# Patient Record
Sex: Male | Born: 1950 | Race: White | Hispanic: No | Marital: Single | Smoking: Former smoker
Health system: Southern US, Community
[De-identification: ages and names within clinical notes are randomized; demographics above are authoritative.]

## PROBLEM LIST (undated history)

## (undated) DIAGNOSIS — Z8719 Personal history of other diseases of the digestive system: Secondary | ICD-10-CM

## (undated) DIAGNOSIS — M1990 Unspecified osteoarthritis, unspecified site: Secondary | ICD-10-CM

## (undated) DIAGNOSIS — I700 Atherosclerosis of aorta: Secondary | ICD-10-CM

## (undated) DIAGNOSIS — T7840XA Allergy, unspecified, initial encounter: Secondary | ICD-10-CM

## (undated) DIAGNOSIS — Z860100 Personal history of colon polyps, unspecified: Secondary | ICD-10-CM

## (undated) DIAGNOSIS — F32A Depression, unspecified: Secondary | ICD-10-CM

## (undated) DIAGNOSIS — S8412XA Injury of peroneal nerve at lower leg level, left leg, initial encounter: Secondary | ICD-10-CM

## (undated) DIAGNOSIS — Z8679 Personal history of other diseases of the circulatory system: Secondary | ICD-10-CM

## (undated) DIAGNOSIS — N400 Enlarged prostate without lower urinary tract symptoms: Secondary | ICD-10-CM

## (undated) DIAGNOSIS — I714 Abdominal aortic aneurysm, without rupture, unspecified: Secondary | ICD-10-CM

## (undated) DIAGNOSIS — I10 Essential (primary) hypertension: Secondary | ICD-10-CM

## (undated) DIAGNOSIS — Z87442 Personal history of urinary calculi: Secondary | ICD-10-CM

## (undated) DIAGNOSIS — K5730 Diverticulosis of large intestine without perforation or abscess without bleeding: Secondary | ICD-10-CM

## (undated) DIAGNOSIS — J309 Allergic rhinitis, unspecified: Secondary | ICD-10-CM

## (undated) DIAGNOSIS — G4730 Sleep apnea, unspecified: Secondary | ICD-10-CM

## (undated) DIAGNOSIS — E079 Disorder of thyroid, unspecified: Secondary | ICD-10-CM

## (undated) DIAGNOSIS — Z9189 Other specified personal risk factors, not elsewhere classified: Secondary | ICD-10-CM

## (undated) DIAGNOSIS — E039 Hypothyroidism, unspecified: Secondary | ICD-10-CM

## (undated) DIAGNOSIS — R21 Rash and other nonspecific skin eruption: Secondary | ICD-10-CM

## (undated) DIAGNOSIS — R7303 Prediabetes: Secondary | ICD-10-CM

## (undated) DIAGNOSIS — F329 Major depressive disorder, single episode, unspecified: Secondary | ICD-10-CM

## (undated) DIAGNOSIS — K219 Gastro-esophageal reflux disease without esophagitis: Secondary | ICD-10-CM

## (undated) DIAGNOSIS — G629 Polyneuropathy, unspecified: Secondary | ICD-10-CM

## (undated) DIAGNOSIS — E785 Hyperlipidemia, unspecified: Secondary | ICD-10-CM

## (undated) DIAGNOSIS — N3289 Other specified disorders of bladder: Secondary | ICD-10-CM

## (undated) DIAGNOSIS — Z8711 Personal history of peptic ulcer disease: Secondary | ICD-10-CM

## (undated) DIAGNOSIS — I252 Old myocardial infarction: Secondary | ICD-10-CM

## (undated) DIAGNOSIS — R079 Chest pain, unspecified: Secondary | ICD-10-CM

## (undated) DIAGNOSIS — Z87898 Personal history of other specified conditions: Secondary | ICD-10-CM

## (undated) DIAGNOSIS — I2510 Atherosclerotic heart disease of native coronary artery without angina pectoris: Secondary | ICD-10-CM

## (undated) DIAGNOSIS — Z955 Presence of coronary angioplasty implant and graft: Secondary | ICD-10-CM

## (undated) DIAGNOSIS — F419 Anxiety disorder, unspecified: Secondary | ICD-10-CM

## (undated) HISTORY — PX: HERNIA REPAIR: SHX51

## (undated) HISTORY — DX: Allergic rhinitis, unspecified: J30.9

## (undated) HISTORY — DX: Sleep apnea, unspecified: G47.30

## (undated) HISTORY — DX: Chest pain, unspecified: R07.9

## (undated) HISTORY — PX: TONSILLECTOMY AND ADENOIDECTOMY: SUR1326

## (undated) HISTORY — DX: Essential (primary) hypertension: I10

## (undated) HISTORY — DX: Abdominal aortic aneurysm, without rupture, unspecified: I71.40

## (undated) HISTORY — DX: Disorder of thyroid, unspecified: E07.9

## (undated) HISTORY — DX: Hyperlipidemia, unspecified: E78.5

## (undated) HISTORY — DX: Atherosclerotic heart disease of native coronary artery without angina pectoris: I25.10

## (undated) HISTORY — PX: TRANSURETHRAL RESECTION OF PROSTATE: SHX73

## (undated) HISTORY — DX: Atherosclerosis of aorta: I70.0

## (undated) HISTORY — DX: Allergy, unspecified, initial encounter: T78.40XA

## (undated) HISTORY — DX: Polyneuropathy, unspecified: G62.9

## (undated) HISTORY — DX: Gastro-esophageal reflux disease without esophagitis: K21.9

## (undated) HISTORY — DX: Hypothyroidism, unspecified: E03.9

## (undated) HISTORY — DX: Anxiety disorder, unspecified: F41.9

## (undated) HISTORY — DX: Unspecified osteoarthritis, unspecified site: M19.90

---

## 1994-02-16 DIAGNOSIS — Z87898 Personal history of other specified conditions: Secondary | ICD-10-CM | POA: Insufficient documentation

## 1994-02-16 DIAGNOSIS — I252 Old myocardial infarction: Secondary | ICD-10-CM

## 1994-02-16 HISTORY — PX: CORONARY ANGIOPLASTY WITH STENT PLACEMENT: SHX49

## 1994-02-16 HISTORY — DX: Old myocardial infarction: I25.2

## 1998-02-20 ENCOUNTER — Emergency Department (HOSPITAL_COMMUNITY): Admission: EM | Admit: 1998-02-20 | Discharge: 1998-02-20 | Payer: Self-pay | Admitting: Emergency Medicine

## 1999-05-23 ENCOUNTER — Emergency Department (HOSPITAL_COMMUNITY): Admission: EM | Admit: 1999-05-23 | Discharge: 1999-05-23 | Payer: Self-pay | Admitting: *Deleted

## 1999-05-24 ENCOUNTER — Emergency Department (HOSPITAL_COMMUNITY): Admission: EM | Admit: 1999-05-24 | Discharge: 1999-05-24 | Payer: Self-pay | Admitting: Emergency Medicine

## 2002-06-24 ENCOUNTER — Emergency Department (HOSPITAL_COMMUNITY): Admission: EM | Admit: 2002-06-24 | Discharge: 2002-06-24 | Payer: Self-pay | Admitting: Emergency Medicine

## 2002-06-26 ENCOUNTER — Emergency Department (HOSPITAL_COMMUNITY): Admission: EM | Admit: 2002-06-26 | Discharge: 2002-06-26 | Payer: Self-pay | Admitting: Emergency Medicine

## 2002-08-07 ENCOUNTER — Encounter: Payer: Self-pay | Admitting: Gastroenterology

## 2002-08-07 ENCOUNTER — Ambulatory Visit (HOSPITAL_COMMUNITY): Admission: RE | Admit: 2002-08-07 | Discharge: 2002-08-07 | Payer: Self-pay | Admitting: Gastroenterology

## 2002-09-06 ENCOUNTER — Emergency Department (HOSPITAL_COMMUNITY): Admission: EM | Admit: 2002-09-06 | Discharge: 2002-09-06 | Payer: Self-pay | Admitting: Emergency Medicine

## 2009-05-02 ENCOUNTER — Ambulatory Visit (HOSPITAL_COMMUNITY): Admission: RE | Admit: 2009-05-02 | Discharge: 2009-05-02 | Payer: Self-pay | Admitting: Urology

## 2009-05-02 HISTORY — PX: EXTRACORPOREAL SHOCK WAVE LITHOTRIPSY: SHX1557

## 2011-01-23 ENCOUNTER — Other Ambulatory Visit (HOSPITAL_BASED_OUTPATIENT_CLINIC_OR_DEPARTMENT_OTHER): Payer: Self-pay | Admitting: General Practice

## 2011-01-23 ENCOUNTER — Ambulatory Visit (HOSPITAL_BASED_OUTPATIENT_CLINIC_OR_DEPARTMENT_OTHER)
Admission: RE | Admit: 2011-01-23 | Discharge: 2011-01-23 | Disposition: A | Discharge status: Home / Self Care | Payer: PRIVATE HEALTH INSURANCE | Source: Ambulatory Visit | Attending: Family Medicine | Admitting: Family Medicine

## 2011-01-23 DIAGNOSIS — Z01818 Encounter for other preprocedural examination: Secondary | ICD-10-CM | POA: Insufficient documentation

## 2011-01-23 DIAGNOSIS — Z0389 Encounter for observation for other suspected diseases and conditions ruled out: Secondary | ICD-10-CM

## 2011-01-23 DIAGNOSIS — M545 Low back pain, unspecified: Secondary | ICD-10-CM | POA: Insufficient documentation

## 2011-01-24 ENCOUNTER — Ambulatory Visit (HOSPITAL_BASED_OUTPATIENT_CLINIC_OR_DEPARTMENT_OTHER)
Admission: RE | Admit: 2011-01-24 | Discharge: 2011-01-24 | Disposition: A | Discharge status: Home / Self Care | Payer: PRIVATE HEALTH INSURANCE | Source: Ambulatory Visit | Attending: Family Medicine | Admitting: Family Medicine

## 2011-01-24 DIAGNOSIS — M519 Unspecified thoracic, thoracolumbar and lumbosacral intervertebral disc disorder: Secondary | ICD-10-CM | POA: Insufficient documentation

## 2011-01-24 DIAGNOSIS — M5137 Other intervertebral disc degeneration, lumbosacral region: Secondary | ICD-10-CM

## 2011-01-24 DIAGNOSIS — M545 Low back pain, unspecified: Secondary | ICD-10-CM | POA: Insufficient documentation

## 2011-01-24 DIAGNOSIS — M79609 Pain in unspecified limb: Secondary | ICD-10-CM

## 2011-01-24 DIAGNOSIS — M51379 Other intervertebral disc degeneration, lumbosacral region without mention of lumbar back pain or lower extremity pain: Secondary | ICD-10-CM | POA: Insufficient documentation

## 2011-06-25 ENCOUNTER — Other Ambulatory Visit (HOSPITAL_COMMUNITY): Payer: Self-pay | Admitting: Family Medicine

## 2011-06-25 DIAGNOSIS — R1310 Dysphagia, unspecified: Secondary | ICD-10-CM

## 2011-06-30 ENCOUNTER — Other Ambulatory Visit (HOSPITAL_COMMUNITY): Payer: PRIVATE HEALTH INSURANCE

## 2011-07-09 ENCOUNTER — Encounter: Payer: Self-pay | Admitting: Internal Medicine

## 2011-07-29 ENCOUNTER — Other Ambulatory Visit: Payer: Self-pay | Admitting: Urology

## 2011-08-12 ENCOUNTER — Ambulatory Visit (INDEPENDENT_AMBULATORY_CARE_PROVIDER_SITE_OTHER): Payer: PRIVATE HEALTH INSURANCE | Admitting: Internal Medicine

## 2011-08-12 ENCOUNTER — Encounter: Payer: Self-pay | Admitting: Internal Medicine

## 2011-08-12 VITALS — BP 132/70 | HR 62 | Ht 71.0 in | Wt 195.0 lb

## 2011-08-12 DIAGNOSIS — D1739 Benign lipomatous neoplasm of skin and subcutaneous tissue of other sites: Secondary | ICD-10-CM

## 2011-08-12 DIAGNOSIS — R142 Eructation: Secondary | ICD-10-CM

## 2011-08-12 DIAGNOSIS — Z1211 Encounter for screening for malignant neoplasm of colon: Secondary | ICD-10-CM

## 2011-08-12 DIAGNOSIS — D171 Benign lipomatous neoplasm of skin and subcutaneous tissue of trunk: Secondary | ICD-10-CM

## 2011-08-12 DIAGNOSIS — R1310 Dysphagia, unspecified: Secondary | ICD-10-CM

## 2011-08-12 DIAGNOSIS — K219 Gastro-esophageal reflux disease without esophagitis: Secondary | ICD-10-CM

## 2011-08-12 MED ORDER — MOVIPREP 100 G PO SOLR: ORAL | Status: DC

## 2011-08-12 NOTE — Patient Instructions (Addendum)
You have been scheduled for an endoscopy and colonoscopy with propofol. Please follow the written instructions given to you at your visit today. Please pick up your prep at the pharmacy within the next 1-3 days.  

## 2011-08-13 ENCOUNTER — Encounter: Payer: Self-pay | Admitting: Internal Medicine

## 2011-08-20 NOTE — Progress Notes (Signed)
HISTORY OF PRESENT ILLNESS:  Larry Randall is a 61 y.o. male with multiple medical problems as listed below. He presents today regarding the bitter taste in his mouth and generalized abdominal discomfort. Patient reports an 8 month history of a burning sensation in his nose and mouth. He states that it feels like his case was or irritated. He saw a dentist who suggested GERD as a cause. He was subsequently placed on Nexium 40 mg daily. He has been on that for about 2 months without improvement. He did have a history of occasional heartburn, though this has resolved on medication. He does report intermittent solid food dysphagia. Occasional liquids. Next, a 7 month history of intermittent generalized abdominal discomfort. This is associated with bloating. He reports that his bowel habits are regular as long as he takes "colon health". No rectal bleeding except for red blood on the tissue on one occasion with straining. He thinks he has hemorrhoids. He reports some weight gain. He is also concerned about the palpable area on his abdomen. He is interested in colon cancer screening. None previously. His sister, Larry Randall, is a patient. He reports that his cardiac conditions are under good control. No recent hospitalizations regarding these issues.  REVIEW OF SYSTEMS:  All non-GI ROS negative except for sinus and allergy trouble, anxiety, arthritis, back pain, visual change, confusion, cough, depression, fatigue, hearing problems, muscle pains, cramps, sleeping problems, sore throat, swollen lymph glands, excessive thirst, excessive urination, urinary frequency and pain, urinary leakage  Past Medical History  Diagnosis Date  . Anxiety   . Arthritis   . Asthma   . CHF (congestive heart failure)   . CAD (coronary artery disease)   . Depressed   . GERD (gastroesophageal reflux disease)   . Hyperlipemia   . Hypertension   . Thyroid disease   . Kidney stone   . UTI (lower urinary tract infection)      Past Surgical History  Procedure Date  . Tonsillectomy   . Wrist surgery     rt  . Prostate biopsy     Social History Larry Randall  reports that he has quit smoking. He does not have any smokeless tobacco history on file. He reports that he does not drink alcohol or use illicit drugs.  family history includes Heart disease in his father.  There is no history of Colon cancer.  Not on File     PHYSICAL EXAMINATION: Vital signs: BP 132/70  Pulse 62  Ht 5\' 11"  (1.803 m)  Wt 195 lb (88.451 kg)  BMI 27.20 kg/m2  Constitutional: generally well-appearing, no acute distress Psychiatric: alert and oriented x3, cooperative Eyes: extraocular movements intact, anicteric, conjunctiva pink Mouth: oral pharynx moist, no lesions Neck: supple no lymphadenopathy Cardiovascular: heart regular rate and rhythm, no murmur Lungs: clear to auscultation bilaterally Abdomen: soft, nontender, nondistended, no obvious ascites, no peritoneal signs, normal bowel sounds, no organomegaly. Palpable mobile lipoma left midabdomen Rectal:deferred until colonoscopy Extremities: no lower extremity edema bilaterally Skin: no lesions on visible extremities Neuro: No focal deficits. No asterixis.     ASSESSMENT:  #1. Burning sensation in the nose and mouth. Etiology unclear. No response to PPI #2. History of intermittent GERD symptoms. Relieved with Nexium. #3. Intermittent solid food dysphagia. Rule out stricture #4. Colon cancer screening. Baseline risk. Appropriate candidate without contraindication. #5. Multiple medical problems. Stable. #6. Abdominal wall lipoma. Reassured  #7. Abdominal discomfort said he sedated with increased gas    PLAN:  #1.  Continue Nexium #2. Reflux precautions  #3. Schedule upper endoscopy with possible esophageal dilation. This to evaluate abdominal discomfort as well as dysphagia.The nature of the procedure, as well as the risks, benefits, and alternatives were  carefully and thoroughly reviewed with the patient. Ample time for discussion and questions allowed. The patient understood, was satisfied, and agreed to proceed.  #4. Consider ENT evaluation for nose and mouth symptoms. His primary provider can arrange this at his discretion #5. Screening colonoscopy.The nature of the procedure, as well as the risks, benefits, and alternatives were carefully and thoroughly reviewed with the patient. Ample time for discussion and questions allowed. The patient understood, was satisfied, and agreed to proceed. Movi prep prescribed. The patient instructed on its use

## 2011-09-02 ENCOUNTER — Encounter (HOSPITAL_BASED_OUTPATIENT_CLINIC_OR_DEPARTMENT_OTHER): Payer: Self-pay | Admitting: *Deleted

## 2011-09-02 NOTE — Progress Notes (Addendum)
NPO AFTER MN. ARRIVES AT 0730. NEEDS ISTAT (HX CAD) . CURRENT EKG AND LAST NOTE TO BE FAXED FROM DR Evelene Croon [facility]). WILL TAKE NEXIUM AM OF SURG W/ SIP OF WATER. UNDERSTANT WILL BEING OVERNIGHT AT MAIN.  REVIEWED CHART W/ DR FORTUNE, INCLUDING LAST EKG AND NOTE FROM PCP. STATES REPEAT EKG DOS.

## 2011-09-07 ENCOUNTER — Encounter (HOSPITAL_BASED_OUTPATIENT_CLINIC_OR_DEPARTMENT_OTHER): Payer: Self-pay | Admitting: Anesthesiology

## 2011-09-07 ENCOUNTER — Other Ambulatory Visit: Payer: Self-pay

## 2011-09-07 ENCOUNTER — Encounter (HOSPITAL_BASED_OUTPATIENT_CLINIC_OR_DEPARTMENT_OTHER)
Admission: RE | Disposition: A | Discharge status: Home / Self Care | Payer: Self-pay | Source: Ambulatory Visit | Attending: Urology

## 2011-09-07 ENCOUNTER — Ambulatory Visit (HOSPITAL_BASED_OUTPATIENT_CLINIC_OR_DEPARTMENT_OTHER): Payer: PRIVATE HEALTH INSURANCE | Admitting: Anesthesiology

## 2011-09-07 ENCOUNTER — Encounter (HOSPITAL_BASED_OUTPATIENT_CLINIC_OR_DEPARTMENT_OTHER): Payer: Self-pay | Admitting: *Deleted

## 2011-09-07 ENCOUNTER — Ambulatory Visit (HOSPITAL_BASED_OUTPATIENT_CLINIC_OR_DEPARTMENT_OTHER)
Admission: RE | Admit: 2011-09-07 | Discharge: 2011-09-08 | Disposition: A | Discharge status: Home / Self Care | Payer: PRIVATE HEALTH INSURANCE | Source: Ambulatory Visit | Attending: Urology | Admitting: Urology

## 2011-09-07 DIAGNOSIS — N401 Enlarged prostate with lower urinary tract symptoms: Secondary | ICD-10-CM | POA: Insufficient documentation

## 2011-09-07 DIAGNOSIS — E785 Hyperlipidemia, unspecified: Secondary | ICD-10-CM | POA: Insufficient documentation

## 2011-09-07 DIAGNOSIS — I2510 Atherosclerotic heart disease of native coronary artery without angina pectoris: Secondary | ICD-10-CM | POA: Insufficient documentation

## 2011-09-07 DIAGNOSIS — R339 Retention of urine, unspecified: Secondary | ICD-10-CM | POA: Insufficient documentation

## 2011-09-07 DIAGNOSIS — N138 Other obstructive and reflux uropathy: Secondary | ICD-10-CM | POA: Insufficient documentation

## 2011-09-07 DIAGNOSIS — K219 Gastro-esophageal reflux disease without esophagitis: Secondary | ICD-10-CM | POA: Insufficient documentation

## 2011-09-07 DIAGNOSIS — N4289 Other specified disorders of prostate: Secondary | ICD-10-CM | POA: Insufficient documentation

## 2011-09-07 DIAGNOSIS — I252 Old myocardial infarction: Secondary | ICD-10-CM | POA: Insufficient documentation

## 2011-09-07 HISTORY — DX: Rash and other nonspecific skin eruption: R21

## 2011-09-07 HISTORY — DX: Personal history of urinary calculi: Z87.442

## 2011-09-07 HISTORY — DX: Personal history of peptic ulcer disease: Z87.11

## 2011-09-07 HISTORY — DX: Old myocardial infarction: I25.2

## 2011-09-07 HISTORY — PX: TRANSURETHRAL RESECTION OF PROSTATE: SHX73

## 2011-09-07 HISTORY — DX: Personal history of other diseases of the circulatory system: Z86.79

## 2011-09-07 HISTORY — DX: Presence of coronary angioplasty implant and graft: Z95.5

## 2011-09-07 HISTORY — DX: Major depressive disorder, single episode, unspecified: F32.9

## 2011-09-07 HISTORY — DX: Depression, unspecified: F32.A

## 2011-09-07 HISTORY — DX: Personal history of other diseases of the digestive system: Z87.19

## 2011-09-07 LAB — POCT I-STAT 4, (NA,K, GLUC, HGB,HCT): HCT: 38 % — ABNORMAL LOW (ref 39.0–52.0)

## 2011-09-07 SURGERY — TRANSURETHRAL RESECTION OF THE PROSTATE WITH GYRUS INSTRUMENTS
Anesthesia: General | Site: Prostate | Wound class: Clean Contaminated

## 2011-09-07 MED ORDER — MIDAZOLAM HCL 5 MG/5ML IJ SOLN
INTRAMUSCULAR | Status: DC | PRN
  Administered 2011-09-07: 1 mg via INTRAVENOUS

## 2011-09-07 MED ORDER — ETOMIDATE 2 MG/ML IV SOLN
INTRAVENOUS | Status: DC | PRN
  Administered 2011-09-07: 30 mg via INTRAVENOUS

## 2011-09-07 MED ORDER — CIPROFLOXACIN HCL 250 MG PO TABS
250.0000 mg | ORAL_TABLET | Freq: Two times a day (BID) | ORAL | Status: DC
  Administered 2011-09-07 (×2): 250 mg via ORAL

## 2011-09-07 MED ORDER — PANTOPRAZOLE SODIUM 40 MG PO TBEC: 40.0000 mg | DELAYED_RELEASE_TABLET | Freq: Every day | ORAL | Status: DC

## 2011-09-07 MED ORDER — BELLADONNA ALKALOIDS-OPIUM 16.2-60 MG RE SUPP
RECTAL | Status: DC | PRN
  Administered 2011-09-07: 1 via RECTAL

## 2011-09-07 MED ORDER — EPHEDRINE SULFATE 50 MG/ML IJ SOLN
INTRAMUSCULAR | Status: DC | PRN
  Administered 2011-09-07: 10 mg via INTRAVENOUS
  Administered 2011-09-07 (×2): 5 mg via INTRAVENOUS
  Administered 2011-09-07: 10 mg via INTRAVENOUS

## 2011-09-07 MED ORDER — ZOLPIDEM TARTRATE 5 MG PO TABS: 5.0000 mg | ORAL_TABLET | Freq: Every evening | ORAL | Status: DC | PRN

## 2011-09-07 MED ORDER — FENTANYL CITRATE 0.05 MG/ML IJ SOLN
INTRAMUSCULAR | Status: DC | PRN
  Administered 2011-09-07 (×6): 25 ug via INTRAVENOUS
  Administered 2011-09-07: 50 ug via INTRAVENOUS

## 2011-09-07 MED ORDER — ONDANSETRON HCL 4 MG/2ML IJ SOLN: 4.0000 mg | INTRAMUSCULAR | Status: DC | PRN

## 2011-09-07 MED ORDER — HYDROCODONE-ACETAMINOPHEN 5-325 MG PO TABS: 1.0000 | ORAL_TABLET | ORAL | Status: DC | PRN

## 2011-09-07 MED ORDER — LACTATED RINGERS IV SOLN
INTRAVENOUS | Status: DC
  Administered 2011-09-07: 100 mL/h via INTRAVENOUS

## 2011-09-07 MED ORDER — DOCUSATE SODIUM 100 MG PO CAPS
100.0000 mg | ORAL_CAPSULE | Freq: Two times a day (BID) | ORAL | Status: DC
  Administered 2011-09-07 (×2): 100 mg via ORAL

## 2011-09-07 MED ORDER — CEFAZOLIN SODIUM-DEXTROSE 2-3 GM-% IV SOLR
2.0000 g | INTRAVENOUS | Status: AC
  Administered 2011-09-07: 2 g via INTRAVENOUS

## 2011-09-07 MED ORDER — LIDOCAINE HCL (CARDIAC) 20 MG/ML IV SOLN
INTRAVENOUS | Status: DC | PRN
  Administered 2011-09-07: 50 mg via INTRAVENOUS

## 2011-09-07 MED ORDER — DEXAMETHASONE SODIUM PHOSPHATE 4 MG/ML IJ SOLN
INTRAMUSCULAR | Status: DC | PRN
  Administered 2011-09-07: 10 mg via INTRAVENOUS

## 2011-09-07 MED ORDER — BELLADONNA ALKALOIDS-OPIUM 16.2-60 MG RE SUPP: 1.0000 | Freq: Four times a day (QID) | RECTAL | Status: DC | PRN

## 2011-09-07 MED ORDER — ONDANSETRON HCL 4 MG/2ML IJ SOLN
INTRAMUSCULAR | Status: DC | PRN
  Administered 2011-09-07: 4 mg via INTRAVENOUS

## 2011-09-07 MED ORDER — DEXTROSE-NACL 5-0.45 % IV SOLN
INTRAVENOUS | Status: DC
  Administered 2011-09-07: 15:00:00 via INTRAVENOUS

## 2011-09-07 MED ORDER — FENTANYL CITRATE 0.05 MG/ML IJ SOLN: 25.0000 ug | INTRAMUSCULAR | Status: DC | PRN

## 2011-09-07 MED ORDER — CEFAZOLIN SODIUM 1-5 GM-% IV SOLN: 1.0000 g | INTRAVENOUS | Status: DC

## 2011-09-07 MED ORDER — SODIUM CHLORIDE 0.9 % IR SOLN
Status: DC | PRN
  Administered 2011-09-07: 48000 mL

## 2011-09-07 MED ORDER — CITALOPRAM HYDROBROMIDE 40 MG PO TABS: 40.0000 mg | ORAL_TABLET | Freq: Every day | ORAL | Status: DC

## 2011-09-07 SURGICAL SUPPLY — 33 items
BAG DRAIN URO-CYSTO SKYTR STRL (DRAIN) ×2 IMPLANT
BAG URINE DRAINAGE (UROLOGICAL SUPPLIES) IMPLANT
BAG URINE LEG 19OZ MD ST LTX (BAG) IMPLANT
CANISTER SUCT LVC 12 LTR MEDI- (MISCELLANEOUS) ×10 IMPLANT
CATH FOLEY 2WAY SLVR  5CC 20FR (CATHETERS)
CATH FOLEY 2WAY SLVR  5CC 22FR (CATHETERS)
CATH FOLEY 2WAY SLVR 30CC 22FR (CATHETERS) IMPLANT
CATH FOLEY 2WAY SLVR 5CC 20FR (CATHETERS) IMPLANT
CATH FOLEY 2WAY SLVR 5CC 22FR (CATHETERS) IMPLANT
CATH FOLEY 3WAY 30CC 22F (CATHETERS) ×2 IMPLANT
CATH HEMA 3WAY 30CC 24FR COUDE (CATHETERS) IMPLANT
CATH HEMA 3WAY 30CC 24FR RND (CATHETERS) IMPLANT
CLOTH BEACON ORANGE TIMEOUT ST (SAFETY) ×2 IMPLANT
DRAPE CAMERA CLOSED 9X96 (DRAPES) ×2 IMPLANT
ELECT BUTTON HF 24-28F 2 30DE (ELECTRODE) IMPLANT
ELECT LOOP MED HF 24F 12D CBL (CLIP) ×2 IMPLANT
ELECT REM PT RETURN 9FT ADLT (ELECTROSURGICAL)
ELECT RESECT VAPORIZE 12D CBL (ELECTRODE) ×2 IMPLANT
ELECTRODE REM PT RTRN 9FT ADLT (ELECTROSURGICAL) IMPLANT
GLOVE BIO SURGEON STRL SZ8 (GLOVE) ×2 IMPLANT
GLOVE ECLIPSE 6.5 STRL STRAW (GLOVE) ×2 IMPLANT
GLOVE INDICATOR 6.5 STRL GRN (GLOVE) ×2 IMPLANT
GOWN PREVENTION PLUS LG XLONG (DISPOSABLE) ×2 IMPLANT
GOWN STRL NON-REIN LRG LVL3 (GOWN DISPOSABLE) ×2 IMPLANT
GOWN STRL REIN XL XLG (GOWN DISPOSABLE) ×2 IMPLANT
HOLDER FOLEY CATH W/STRAP (MISCELLANEOUS) IMPLANT
IV NS IRRIG 3000ML ARTHROMATIC (IV SOLUTION) ×32 IMPLANT
KIT ASPIRATION TUBING (SET/KITS/TRAYS/PACK) ×2 IMPLANT
PACK CYSTOSCOPY (CUSTOM PROCEDURE TRAY) ×2 IMPLANT
PLUG CATH AND CAP STER (CATHETERS) IMPLANT
SET ASPIRATION TUBING (TUBING) IMPLANT
SYR 30ML LL (SYRINGE) ×2 IMPLANT
SYRINGE IRR TOOMEY STRL 70CC (SYRINGE) ×2 IMPLANT

## 2011-09-07 NOTE — H&P (Signed)
Urology History and Physical Exam  CC: Large prostate  HPI: 61 year old male presents today for TURP. He has a following history:   He is on both alpha blockers and Avodart for a very large prostate. In 2011, ultrasound and biopsy of the prostate was performed due to elevated PSA of between 8 and 9. Benign findings were noted. Glandular size was approximately 190 g at that time. Since his last visit with me in 2012, he has been in retention. He was last seen in our office in May, 2013 for voiding trial. At this point, despite him using Avodart and Uroxatral, he would like to come off these medicines and have a procedure done for his prostate.     PMH: Past Medical History  Diagnosis Date  . Anxiety   . Arthritis   . GERD (gastroesophageal reflux disease)   . Hyperlipemia   . CAD (coronary artery disease) FOLLOWED PCP  DR Barnetta Chapel  [facility])    PT DENIES CARDIAC SYMPTOMS  . History of MI (myocardial infarction) 1996--  S/P ANGIOPLASTY X3 STENTS  . S/P primary angioplasty with coronary stent X3 STENTS  1996  POST MI    PER PT BARE METAL STENTS  . H/O hiatal hernia   . History of CHF (congestive heart failure)   . History of gastric ulcer   . Atypical rash OF MOUTH -- PER PT UNKNOWN ETIOLOGY    RX MOUTHWASH BID-  PT STATES POSSIBLE FROM GERD  . History of kidney stones   . Depression     PSH: Past Surgical History  Procedure Date  . Tonsillectomy and adenoidectomy CHILD  . Extracorporeal shock wave lithotripsy 05-02-2009  . Transurethral resection of prostate X2   YRS AGO    BEFORE 1996  . Coronary angioplasty with stent placement 1996    PER PT X3 STENTS (BARE METAL)    Allergies: No Known Allergies  Medications: No prescriptions prior to admission     Social History: History   Social History  . Marital Status: Divorced    Spouse Name: N/A    Number of Children: 3  . Years of Education: N/A   Occupational History  . Not on file.    Social History Main Topics  . Smoking status: Former Smoker -- 1.0 packs/day for 30 years    Types: Cigarettes    Quit date: 09/02/1994  . Smokeless tobacco: Never Used  . Alcohol Use: No  . Drug Use: No  . Sexually Active: Not on file   Other Topics Concern  . Not on file   Social History Narrative  . No narrative on file    Family History: Family History  Problem Relation Age of Onset  . Heart disease Father   . Colon cancer Neg Hx     Review of Systems: Genitourinary, constitutional, skin, eye, otolaryngeal, hematologic/lymphatic, cardiovascular, pulmonary, endocrine, musculoskeletal, gastrointestinal, neurological and psychiatric system(s) were reviewed and pertinent findings if present are noted.  Genitourinary: urinary frequency, feelings of urinary urgency, difficulty starting the urinary stream and initiating urination requires straining.   Physical Exam: @VITALS2 @ General: No acute distress.  Awake. Head:  Normocephalic.  Atraumatic. ENT:  EOMI.  Mucous membranes moist Neck:  Supple.  No lymphadenopathy. CV:  S1 present. S2 present. Regular rate. Pulmonary: Equal effort bilaterally.  Clear to auscultation bilaterally. Abdomen: Soft. on tender to palpation. Skin:  Normal turgor.  No visible rash. Extremity: No gross deformity of bilateral upper extremities.  No gross  deformity of    bilateral lower extremities. Neurologic: Alert. Appropriate mood.    Studies:  No results found for this basename: HGB:2,WBC:2,PLT:2 in the last 72 hours  No results found for this basename: NA:2,K:2,CL:2,CO2:2,BUN:2,CREATININE:2,CALCIUM:2,MAGNESIUM:2,GFRNONAA:2,GFRAA:2 in the last 72 hours   No results found for this basename: PT:2,INR:2,APTT:2 in the last 72 hours   No components found with this basename: ABG:2    Assessment:  BPH with huge gland  Plan: TURP, possibly in a staged procedure

## 2011-09-07 NOTE — Anesthesia Preprocedure Evaluation (Signed)
Anesthesia Evaluation  Patient identified by MRN, date of birth, ID band Patient awake    Reviewed: Allergy & Precautions, H&P , NPO status , Patient's Chart, lab work & pertinent test results, reviewed documented beta blocker date and time   Airway Mallampati: II TM Distance: >3 FB Neck ROM: Full    Dental  (+) Upper Dentures and Lower Dentures   Pulmonary former smoker,  breath sounds clear to auscultation        Cardiovascular + CAD and + Past MI Rhythm:Regular Rate:Normal  Pt denies HTN, old records indicate hx of HTN CAD, s/p PTCA w/ stent X3  Hx MI Currently asymtopmatic Recent eval by PCP this year- stable Denies hx of CHF   Neuro/Psych negative neurological ROS  negative psych ROS   GI/Hepatic Neg liver ROS, GERD-  ,  Endo/Other  Pt stopped thyroid replacement in Jan(non-compliant)  Renal/GU negative Renal ROS   BPH    Musculoskeletal negative musculoskeletal ROS (+)   Abdominal   Peds negative pediatric ROS (+)  Hematology negative hematology ROS (+)   Anesthesia Other Findings   Reproductive/Obstetrics negative OB ROS                           Anesthesia Physical Anesthesia Plan  ASA: III  Anesthesia Plan: General   Post-op Pain Management:    Induction: Intravenous  Airway Management Planned: LMA  Additional Equipment:   Intra-op Plan:   Post-operative Plan: Extubation in OR  Informed Consent: I have reviewed the patients History and Physical, chart, labs and discussed the procedure including the risks, benefits and alternatives for the proposed anesthesia with the patient or authorized representative who has indicated his/her understanding and acceptance.     Plan Discussed with: CRNA and Surgeon  Anesthesia Plan Comments:         Anesthesia Quick Evaluation

## 2011-09-07 NOTE — Transfer of Care (Signed)
Immediate Anesthesia Transfer of Care Note  Patient: Larry Randall  Procedure(s) Performed: Procedure(s) (LRB): TRANSURETHRAL RESECTION OF THE PROSTATE WITH GYRUS INSTRUMENTS (N/A)  Patient Location: PACU  Anesthesia Type: General  Level of Consciousness:drowsy, arouses to name, follows commands.  Airway & Oxygen Therapy: Patient Spontanous Breathing, oral removed upon arrival, and Patient connected to face mask oxygen  Post-op Assessment: Report given to PACU RN and Post -op Vital signs reviewed and stable  Post vital signs: Reviewed and stable  Complications: No apparent anesthesia complications

## 2011-09-07 NOTE — Anesthesia Procedure Notes (Signed)
Procedure Name: LMA Insertion Date/Time: 09/07/2011 9:10 AM Performed by: Norva Pavlov Pre-anesthesia Checklist: Patient identified, Emergency Drugs available, Suction available and Patient being monitored Patient Re-evaluated:Patient Re-evaluated prior to inductionOxygen Delivery Method: Circle System Utilized Preoxygenation: Pre-oxygenation with 100% oxygen Intubation Type: IV induction Ventilation: Mask ventilation without difficulty LMA: LMA inserted LMA Size: 4.0 Number of attempts: 1 Airway Equipment and Method: bite block Placement Confirmation: positive ETCO2 Tube secured with: Tape Dental Injury: Teeth and Oropharynx as per pre-operative assessment  Comments: LMA inserted by Dr. Shireen Quan.

## 2011-09-07 NOTE — Anesthesia Postprocedure Evaluation (Signed)
  Anesthesia Post-op Note  Patient: Larry Randall  Procedure(s) Performed: Procedure(s) (LRB): TRANSURETHRAL RESECTION OF THE PROSTATE WITH GYRUS INSTRUMENTS (N/A)  Patient Location: PACU  Anesthesia Type: General  Level of Consciousness: oriented and sedated  Airway and Oxygen Therapy: Patient Spontanous Breathing and Patient connected to nasal cannula oxygen  Post-op Pain: mild  Post-op Assessment: Post-op Vital signs reviewed, Patient's Cardiovascular Status Stable, Respiratory Function Stable and Patent Airway  Post-op Vital Signs: stable  Complications: No apparent anesthesia complications

## 2011-09-07 NOTE — Op Note (Signed)
Preoperative diagnosis:BPH Postoperative diagnosis:same  Procedure:TUR-P   Surgeon: Bertram Millard. Ahnna Dungan, M.D.  Anesthesia: Gen.  Indications:61 year old male presents today for TURP. He has a following history:  He is on both alpha blockers and Avodart for a very large prostate. In 2011, ultrasound and biopsy of the prostate was performed due to elevated PSA of between 8 and 9. Benign findings were noted. Glandular size was approximately 190 g at that time. Since his last visit with me in 2012, he has been in retention. He was last seen in our office in May, 2013 for voiding trial. At this point, despite him using Avodart and Uroxatral, he would like to come off these medicines and have a procedure done for his prostate.      Technique and findings: The gentleman was properly identified in the holding area and went to the operating room where general anesthetic was administered. He was placed in the dorsolithotomy position. Genitalia and perineum were prepped and draped. Timeout was then performed.  A 28 French resectoscope was placed using the obturator, and the resectoscope with 12 lens was placed. The cutting loop was used. Ureteral orifices were properly identified. There was a median lobe that was resected from the bladder neck to the verumontanum, preserving the verumontanum. Because of the huge size of the prostate, the right lobe was resected without significant resection of the left lobe. Resection was begun at the 12:00 position, from the bladder neck to the distal prostatic urethra. Resection was then carried out in the counterclockwise direction, resecting down to the surgical capsule. A large amount of prostate was resected. Resection was carried out for approximately 1 hour and 40 minutes. Following this, the prostatic urethra was open, although the left lobe was protuberant into the prostatic urethra. Apical tissue was carefully resected on the right side. Small bleeders were  electrocoagulated using the gyrus button. At this point, hemostasis was excellent. The chips were irrigated from the bladder. There were sent for permanent pathology. The scope was then removed, and a 22 [place] three-way Foley catheter was placed and hooked traction. A B. and O. suppository was placed prior to the procedure. He tolerated procedure well and was transported to the PACU in stable condition.

## 2011-09-08 ENCOUNTER — Encounter (HOSPITAL_BASED_OUTPATIENT_CLINIC_OR_DEPARTMENT_OTHER): Payer: Self-pay | Admitting: Urology

## 2011-09-08 MED ORDER — CIPROFLOXACIN HCL 250 MG PO TABS: 250.0000 mg | ORAL_TABLET | Freq: Two times a day (BID) | ORAL | Status: AC

## 2011-09-08 NOTE — Discharge Summary (Signed)
Patient ID: Larry Randall MRN: [id number] DOB/AGE: [birth year] 61 y.o.  Admit date: 09/07/2011 Discharge date: 09/08/2011  Primary Care Physician:  No primary provider on file.  Discharge Diagnoses:  BPH  Consults:  None    Discharge Medications: Medication List  As of 09/08/2011  7:47 AM   STOP taking these medications         alfuzosin 10 MG 24 hr tablet      aspirin 81 MG tablet      dutasteride 0.5 MG capsule         TAKE these medications         AMBULATORY NON FORMULARY MEDICATION   Medication Name: Intestinal Soothe & Build  1 capsule daily      AMBULATORY NON FORMULARY MEDICATION   Medication Name: Gastrex   1 capsule daily      chlorhexidine 0.12 % solution   Commonly known as: PERIDEX   Use as directed 15 mLs in the mouth or throat 2 (two) times daily.      ciprofloxacin 250 MG tablet   Commonly known as: CIPRO   Take 1 tablet (250 mg total) by mouth every 12 (twelve) hours.      citalopram 40 MG tablet   Commonly known as: CELEXA   Take 40 mg by mouth daily.      DIGESTIVE HEALTH PROBIOTIC Caps   Take 1 tablet by mouth daily.      esomeprazole 40 MG capsule   Commonly known as: NEXIUM   Take 40 mg by mouth daily before breakfast.      L-GLUTAMINE PO   Take 1 capsule by mouth daily.      Magnesium 250 MG Tabs   Take 1 tablet by mouth daily.      PHILLIPS COLON HEALTH Caps   Take 1 capsule by mouth daily.      ZINC PO   Take 20 mg by mouth daily.             Significant Diagnostic Studies:  No results found.  Brief H and P: For complete details please refer to admission H and P, but in brief the patient was admitted for TURP. He has a history of significant voiding symptomatology despite maximal medical therapy.  Hospital Course:  The patient underwent TURP on the day of admission. He had an uncomplicated postoperative course and voided well after the catheter was removed.  Day of Discharge BP 114/67  Pulse 61  Temp 97.3  F (36.3 C) (Oral)  Resp 18  Ht 5\' 10"  (1.778 m)  Wt 86.183 kg (190 lb)  BMI 27.26 kg/m2  SpO2 99%  Results for orders placed during the hospital encounter of 09/07/11 (from the past 24 hour(s))  POCT I-STAT 4, (NA,K, GLUC, HGB,HCT)     Status: Abnormal   Collection Time   09/07/11  8:11 AM      Component Value Range   Sodium 141  135 - 145 mEq/L   Potassium 4.1  3.5 - 5.1 mEq/L   Glucose, Bld 126 (*) 70 - 99 mg/dL   HCT 40.9 (*) 81.1 - 91.4 %   Hemoglobin 12.9 (*) 13.0 - 17.0 g/dL    Physical Exam: General: Alert and awake oriented x3 not in any acute distress. HEENT: anicteric sclera, pupils reactive to light and accommodation CVS: S1-S2 clear no murmur rubs or gallops Chest: clear to auscultation bilaterally, no wheezing rales or rhonchi Abdomen: soft nontender, nondistended, normal bowel sounds, no organomegaly Extremities: no  cyanosis, clubbing or edema noted bilaterally Neuro: Cranial nerves II-XII intact, no focal neurological deficits  Disposition: Home  Diet: No restrictions  Activity: Discussed with patient   Disposition and Follow-up: Discharge Orders    Future Appointments: Provider: Department: Dept Phone: Center:   09/30/2011 2:30 PM Hilarie Fredrickson, MD [facility] [phone] [id number]        TESTS THAT NEED FOLLOW-UP Pathology review  DISCHARGE FOLLOW-UP Follow-up Information    Follow up with Chelsea Aus, MD. (as scheduled)    Contact information:   [address] [place] [address] [phone]          Time spent on Discharge: 10 minutes  Signed: Chelsea Aus 09/08/2011, 7:47 AM

## 2011-09-08 NOTE — Progress Notes (Signed)
Voided 250 cc post catheter removal. Light pink urine.

## 2011-09-30 ENCOUNTER — Telehealth: Payer: Self-pay | Admitting: Internal Medicine

## 2011-09-30 ENCOUNTER — Encounter: Payer: PRIVATE HEALTH INSURANCE | Admitting: Internal Medicine

## 2011-09-30 MED ORDER — ESOMEPRAZOLE MAGNESIUM 40 MG PO CPDR: 40.0000 mg | DELAYED_RELEASE_CAPSULE | Freq: Every day | ORAL | Status: DC

## 2011-09-30 NOTE — Telephone Encounter (Signed)
Patient needs a refill on nexium.  He is also calling to report that he is still having burning of his lips and mouth.  He saw his ENT last week and they started him on new meds that are not helping either.  I have asked him to contact his ENT, and keep his appt for colon/endo

## 2011-09-30 NOTE — Telephone Encounter (Signed)
No charge. He should reschedule when he is well enough

## 2011-10-29 ENCOUNTER — Ambulatory Visit (AMBULATORY_SURGERY_CENTER): Payer: PRIVATE HEALTH INSURANCE | Admitting: Internal Medicine

## 2011-10-29 ENCOUNTER — Encounter: Payer: Self-pay | Admitting: Internal Medicine

## 2011-10-29 VITALS — BP 148/88 | HR 85 | Temp 99.6°F | Resp 16 | Ht 70.0 in | Wt 195.0 lb

## 2011-10-29 DIAGNOSIS — Z1211 Encounter for screening for malignant neoplasm of colon: Secondary | ICD-10-CM

## 2011-10-29 DIAGNOSIS — R1310 Dysphagia, unspecified: Secondary | ICD-10-CM

## 2011-10-29 DIAGNOSIS — D126 Benign neoplasm of colon, unspecified: Secondary | ICD-10-CM

## 2011-10-29 DIAGNOSIS — K219 Gastro-esophageal reflux disease without esophagitis: Secondary | ICD-10-CM

## 2011-10-29 HISTORY — PX: COLONOSCOPY WITH ESOPHAGOGASTRODUODENOSCOPY (EGD): SHX5779

## 2011-10-29 MED ORDER — SODIUM CHLORIDE 0.9 % IV SOLN: 500.0000 mL | INTRAVENOUS | Status: DC

## 2011-10-29 NOTE — Op Note (Signed)
[facility] [address] [place], [address]   ENDOSCOPY PROCEDURE REPORT  PATIENT: Larry Randall, Larry Randall  MR#: [id number] BIRTHDATE: 03-16-1950 , 60  yrs. old GENDER: Male ENDOSCOPIST: Roxy Cedar, MD REFERRED BY:  Office Self PROCEDURE DATE:  10/29/2011 PROCEDURE:  EGD, diagnostic and Maloney dilation of esophagus  - 89F ASA CLASS:     Class II INDICATIONS:  heartburn.   dysphagia. MEDICATIONS: MAC sedation, administered by CRNA and propofol (Diprivan) 100mg  IV TOPICAL ANESTHETIC: none  DESCRIPTION OF PROCEDURE: After the risks benefits and alternatives of the procedure were thoroughly explained, informed consent was obtained.  The LB-GIF Q180 [id number] endoscope was introduced through the mouth and advanced to the second portion of the duodenum. Without limitations.  The instrument was slowly withdrawn as the mucosa was fully examined.    The upper, middle and distal third of the esophagus were carefully inspected and no abnormalities were noted.  The z-line was well seen at the GEJ.  The endoscope was pushed into the fundus which was normal including a retroflexed view.  The antrum, gastric body, first and second part of the duodenum were unremarkable. Retroflexed views revealed no abnormalities.     The scope was then withdrawn from the patient and the procedure completed.  THERAPY: 52 F MALONEY DILATOR PASSED W/O RESISTANCE OR HEME. TOLERATED WELL  COMPLICATIONS: There were no complications.  ENDOSCOPIC IMPRESSION: 1. Normal EGD  S/P DILATION  RECOMMENDATIONS: 1.  Continue PPI - as needed for heartburn 2.  Clear liquids until 4pm , then soft foods rest of day.  Resume prior diet tomorrow. 3.  Return to the care of your primary provider  e]  eSigned:  Roxy Cedar, MD 10/29/2011 2:20 PM  CC:The Patient

## 2011-10-29 NOTE — Progress Notes (Addendum)
Patient did not have preoperative order for IV antibiotic SSI prophylaxis. (G8918)  Patient did not experience any of the following events: a burn prior to discharge; a fall within the facility; wrong site/side/patient/procedure/implant event; or a hospital transfer or hospital admission upon discharge from the facility. (G8907)  

## 2011-10-29 NOTE — Patient Instructions (Addendum)
YOU HAD AN ENDOSCOPIC PROCEDURE TODAY AT THE [facility]: Refer to the procedure report that was given to you for any specific questions about what was found during the examination.  If the procedure report does not answer your questions, please call your gastroenterologist to clarify.  If you requested that your care partner not be given the details of your procedure findings, then the procedure report has been included in a sealed envelope for you to review at your convenience later.  YOU SHOULD EXPECT: Some feelings of bloating in the abdomen. Passage of more gas than usual.  Walking can help get rid of the air that was put into your GI tract during the procedure and reduce the bloating. If you had a lower endoscopy (such as a colonoscopy or flexible sigmoidoscopy) you may notice spotting of blood in your stool or on the toilet paper. If you underwent a bowel prep for your procedure, then you may not have a normal bowel movement for a few days.  DIET: Have some water when you get home to be sure you don't choke.   Then you may have a SOFT DIET for the rest of today.  Heavy or fried foods are harder to digest and may make you feel nauseous or bloated.  Likewise meals heavy in dairy and vegetables can cause extra gas to form and this can also increase the bloating.  Drink plenty of fluids but you should avoid alcoholic beverages for 24 hours.  ACTIVITY: Your care partner should take you home directly after the procedure.  You should plan to take it easy, moving slowly for the rest of the day.  You can resume normal activity the day after the procedure however you should NOT DRIVE or use heavy machinery for 24 hours (because of the sedation medicines used during the test).    SYMPTOMS TO REPORT IMMEDIATELY: A gastroenterologist can be reached at any hour.  During normal business hours, 8:30 AM to 5:00 PM Monday through Friday, call [phone].  After hours and on weekends, please call the  GI answering service at [phone] who will take a message and have the physician on call contact you.   Following lower endoscopy (colonoscopy or flexible sigmoidoscopy):  Excessive amounts of blood in the stool  Significant tenderness or worsening of abdominal pains  Swelling of the abdomen that is new, acute  Fever of 100F or higher  Following upper endoscopy (EGD)  Vomiting of blood or coffee ground material  New chest pain or pain under the shoulder blades  Painful or persistently difficult swallowing  New shortness of breath  Fever of 100F or higher  Black, tarry-looking stools  FOLLOW UP: If any biopsies were taken you will be contacted by phone or by letter within the next 1-3 weeks.  Call your gastroenterologist if you have not heard about the biopsies in 3 weeks.  Our staff will call the home number listed on your records the next business day following your procedure to check on you and address any questions or concerns that you may have at that time regarding the information given to you following your procedure. This is a courtesy call and so if there is no answer at the home number and we have not heard from you through the emergency physician on call, we will assume that you have returned to your regular daily activities without incident.  SIGNATURES/CONFIDENTIALITY: You and/or your care partner have signed paperwork which will be entered into your  electronic medical record.  These signatures attest to the fact that that the information above on your After Visit Summary has been reviewed and is understood.  Full responsibility of the confidentiality of this discharge information lies with you and/or your care-partner.   Thank-you for choosing [place] for your medical needs.

## 2011-10-29 NOTE — Op Note (Signed)
[facility] [address] [place], [address]   COLONOSCOPY PROCEDURE REPORT  PATIENT: Larry, Randall  MR#: [id number] BIRTHDATE: 12-Aug-1950 , 60  yrs. old GENDER: Male ENDOSCOPIST: Roxy Cedar, MD REFERRED BY:.  Self / Office PROCEDURE DATE:  10/29/2011 PROCEDURE:   Colonoscopy with snare polypectomy    x 1 ASA CLASS:   Class II INDICATIONS:average risk screening. MEDICATIONS: MAC sedation, administered by CRNA and propofol (Diprivan) 280mg  IV  DESCRIPTION OF PROCEDURE:   After the risks benefits and alternatives of the procedure were thoroughly explained, informed consent was obtained.  A digital rectal exam revealed no abnormalities of the rectum.   The LB CF-[id number]  endoscope was introduced through the anus and advanced to the cecum, which was identified by both the appendix and ileocecal valve. No adverse events experienced.   The quality of the prep was excellent, using MoviPrep  The instrument was then slowly withdrawn as the colon was fully examined.      COLON FINDINGS: A single polyp measuring 1 mm in size was found in the ascending colon.  A polypectomy was performed with a cold snare.  The resection was complete. No meaningful tissue available for retrieval.   Moderate diverticulosis was noted in the sigmoid colon.   The colon mucosa was otherwise normal.  Retroflexed views revealed internal hemorrhoids. The time to cecum=1 minutes 31 seconds.  Withdrawal time=12 minutes 40 seconds.  The scope was withdrawn and the procedure completed. COMPLICATIONS: There were no complications.  ENDOSCOPIC IMPRESSION: 1.   Single polyp measuring 1 mm in size was found in the ascending colon; polypectomy was performed with a cold snare 2.   Moderate diverticulosis was noted in the sigmoid colon 3.   The colon mucosa was otherwise normal  RECOMMENDATIONS: 1. Continue current colorectal screening recommendations for "routine risk" patients  with a repeat colonoscopy in 10 years.   eSigned:  Roxy Cedar, MD 10/29/2011 2:14 PM   cc: The Patient   PATIENT NAME:  Larry, Randall MR#: [id number]

## 2011-10-30 ENCOUNTER — Telehealth: Payer: Self-pay | Admitting: *Deleted

## 2011-10-30 NOTE — Telephone Encounter (Signed)
  Follow up Call-  Call back number 10/29/2011  Post procedure Call Back phone  # [phone]  Permission to leave phone message Yes     Patient questions:  Do you have a fever, pain , or abdominal swelling? no Pain Score  0 *  Have you tolerated food without any problems? yes  Have you been able to return to your normal activities? yes  Do you have any questions about your discharge instructions: Diet   no Medications  no Follow up visit  no  Do you have questions or concerns about your Care? no  Actions: * If pain score is 4 or above: No action needed, pain <4.

## 2011-11-08 ENCOUNTER — Inpatient Hospital Stay (HOSPITAL_COMMUNITY)
Admission: EM | Admit: 2011-11-08 | Discharge: 2011-11-19 | DRG: 494 | Disposition: A | Discharge status: Home Health | Payer: Worker's Compensation | Attending: Orthopedic Surgery | Admitting: Orthopedic Surgery

## 2011-11-08 DIAGNOSIS — I2510 Atherosclerotic heart disease of native coronary artery without angina pectoris: Secondary | ICD-10-CM | POA: Diagnosis present

## 2011-11-08 DIAGNOSIS — W11XXXA Fall on and from ladder, initial encounter: Secondary | ICD-10-CM | POA: Diagnosis present

## 2011-11-08 DIAGNOSIS — I10 Essential (primary) hypertension: Secondary | ICD-10-CM | POA: Diagnosis present

## 2011-11-08 DIAGNOSIS — S82899A Other fracture of unspecified lower leg, initial encounter for closed fracture: Principal | ICD-10-CM | POA: Diagnosis present

## 2011-11-08 DIAGNOSIS — E785 Hyperlipidemia, unspecified: Secondary | ICD-10-CM | POA: Diagnosis present

## 2011-11-08 DIAGNOSIS — F411 Generalized anxiety disorder: Secondary | ICD-10-CM | POA: Diagnosis present

## 2011-11-08 DIAGNOSIS — K1370 Unspecified lesions of oral mucosa: Secondary | ICD-10-CM | POA: Diagnosis present

## 2011-11-08 DIAGNOSIS — F3289 Other specified depressive episodes: Secondary | ICD-10-CM | POA: Diagnosis present

## 2011-11-08 DIAGNOSIS — S92009A Unspecified fracture of unspecified calcaneus, initial encounter for closed fracture: Secondary | ICD-10-CM | POA: Diagnosis present

## 2011-11-08 DIAGNOSIS — Z9861 Coronary angioplasty status: Secondary | ICD-10-CM

## 2011-11-08 DIAGNOSIS — T300 Burn of unspecified body region, unspecified degree: Secondary | ICD-10-CM

## 2011-11-08 DIAGNOSIS — Y9269 Other specified industrial and construction area as the place of occurrence of the external cause: Secondary | ICD-10-CM

## 2011-11-08 DIAGNOSIS — K219 Gastro-esophageal reflux disease without esophagitis: Secondary | ICD-10-CM | POA: Diagnosis present

## 2011-11-08 DIAGNOSIS — F329 Major depressive disorder, single episode, unspecified: Secondary | ICD-10-CM | POA: Diagnosis present

## 2011-11-08 DIAGNOSIS — Z87891 Personal history of nicotine dependence: Secondary | ICD-10-CM

## 2011-11-08 DIAGNOSIS — Y998 Other external cause status: Secondary | ICD-10-CM

## 2011-11-08 DIAGNOSIS — S92001A Unspecified fracture of right calcaneus, initial encounter for closed fracture: Secondary | ICD-10-CM

## 2011-11-08 DIAGNOSIS — S92002A Unspecified fracture of left calcaneus, initial encounter for closed fracture: Secondary | ICD-10-CM

## 2011-11-08 DIAGNOSIS — S82209A Unspecified fracture of shaft of unspecified tibia, initial encounter for closed fracture: Secondary | ICD-10-CM
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
## 2011-11-09 ENCOUNTER — Emergency Department (HOSPITAL_COMMUNITY): Payer: Worker's Compensation

## 2011-11-09 ENCOUNTER — Encounter (HOSPITAL_COMMUNITY): Payer: Self-pay | Admitting: Anesthesiology

## 2011-11-09 ENCOUNTER — Inpatient Hospital Stay (HOSPITAL_COMMUNITY): Payer: Worker's Compensation | Admitting: Anesthesiology

## 2011-11-09 ENCOUNTER — Inpatient Hospital Stay (HOSPITAL_COMMUNITY): Payer: Worker's Compensation

## 2011-11-09 ENCOUNTER — Encounter (HOSPITAL_COMMUNITY)
Admission: EM | Disposition: A | Discharge status: Home Health | Payer: Self-pay | Source: Home / Self Care | Attending: Orthopedic Surgery

## 2011-11-09 ENCOUNTER — Encounter (HOSPITAL_COMMUNITY): Payer: Self-pay | Admitting: Radiology

## 2011-11-09 HISTORY — PX: EXTERNAL FIXATION LEG: SHX1549

## 2011-11-09 LAB — CBC WITH DIFFERENTIAL/PLATELET
Basophils Absolute: 0 10*3/uL (ref 0.0–0.1)
HCT: 38.8 % — ABNORMAL LOW (ref 39.0–52.0)
Hemoglobin: 13.8 g/dL (ref 13.0–17.0)
Lymphocytes Relative: 13 % (ref 12–46)
Lymphs Abs: 1.6 10*3/uL (ref 0.7–4.0)
Monocytes Absolute: 0.9 10*3/uL (ref 0.1–1.0)
Monocytes Relative: 7 % (ref 3–12)
Neutro Abs: 9.8 10*3/uL — ABNORMAL HIGH (ref 1.7–7.7)
RBC: 4.53 MIL/uL (ref 4.22–5.81)
RDW: 13.3 % (ref 11.5–15.5)
WBC: 12.5 10*3/uL — ABNORMAL HIGH (ref 4.0–10.5)

## 2011-11-09 LAB — SAMPLE TO BLOOD BANK

## 2011-11-09 LAB — BASIC METABOLIC PANEL
CO2: 19 mEq/L (ref 19–32)
Chloride: 102 mEq/L (ref 96–112)
Creatinine, Ser: 1.01 mg/dL (ref 0.50–1.35)

## 2011-11-09 LAB — URINE MICROSCOPIC-ADD ON

## 2011-11-09 LAB — URINALYSIS, ROUTINE W REFLEX MICROSCOPIC
Glucose, UA: NEGATIVE mg/dL
Ketones, ur: 40 mg/dL — AB
pH: 7 (ref 5.0–8.0)

## 2011-11-09 LAB — PROTIME-INR: Prothrombin Time: 14.8 seconds (ref 11.6–15.2)

## 2011-11-09 SURGERY — EXTERNAL FIXATION, LOWER EXTREMITY
Anesthesia: General | Site: Ankle | Laterality: Left | Wound class: Clean

## 2011-11-09 MED ORDER — SENNA 8.6 MG PO TABS
1.0000 | ORAL_TABLET | Freq: Two times a day (BID) | ORAL | Status: DC
  Administered 2011-11-10 – 2011-11-16 (×13): 8.6 mg via ORAL
  Filled 2011-11-09 (×15): qty 1

## 2011-11-09 MED ORDER — FENTANYL CITRATE 0.05 MG/ML IJ SOLN
INTRAMUSCULAR | Status: DC | PRN
  Administered 2011-11-09 (×3): 50 ug via INTRAVENOUS

## 2011-11-09 MED ORDER — CEFAZOLIN SODIUM-DEXTROSE 2-3 GM-% IV SOLR
INTRAVENOUS | Status: AC
  Filled 2011-11-09: qty 50

## 2011-11-09 MED ORDER — ONDANSETRON HCL 4 MG/2ML IJ SOLN
4.0000 mg | Freq: Once | INTRAMUSCULAR | Status: AC
  Administered 2011-11-09: 4 mg via INTRAVENOUS
  Filled 2011-11-09: qty 2

## 2011-11-09 MED ORDER — DOCUSATE SODIUM 100 MG PO CAPS
100.0000 mg | ORAL_CAPSULE | Freq: Two times a day (BID) | ORAL | Status: DC
  Administered 2011-11-10 – 2011-11-19 (×18): 100 mg via ORAL
  Filled 2011-11-09 (×23): qty 1

## 2011-11-09 MED ORDER — PANTOPRAZOLE SODIUM 40 MG PO TBEC
40.0000 mg | DELAYED_RELEASE_TABLET | Freq: Every day | ORAL | Status: DC
  Administered 2011-11-10 – 2011-11-19 (×11): 40 mg via ORAL
  Filled 2011-11-09 (×10): qty 1

## 2011-11-09 MED ORDER — LIDOCAINE HCL (CARDIAC) 20 MG/ML IV SOLN
INTRAVENOUS | Status: DC | PRN
  Administered 2011-11-09: 60 mg via INTRAVENOUS

## 2011-11-09 MED ORDER — SUCCINYLCHOLINE CHLORIDE 20 MG/ML IJ SOLN
INTRAMUSCULAR | Status: DC | PRN
  Administered 2011-11-09: 120 mg via INTRAVENOUS

## 2011-11-09 MED ORDER — HYDROMORPHONE HCL PF 1 MG/ML IJ SOLN
1.0000 mg | INTRAMUSCULAR | Status: DC | PRN
  Administered 2011-11-09 – 2011-11-17 (×25): 1 mg via INTRAVENOUS
  Filled 2011-11-09 (×26): qty 1

## 2011-11-09 MED ORDER — ALUM & MAG HYDROXIDE-SIMETH 200-200-20 MG/5ML PO SUSP
30.0000 mL | Freq: Four times a day (QID) | ORAL | Status: DC | PRN
  Administered 2011-11-09: 30 mL via ORAL
  Filled 2011-11-09: qty 30

## 2011-11-09 MED ORDER — OXYCODONE HCL 5 MG/5ML PO SOLN: 5.0000 mg | Freq: Once | ORAL | Status: DC | PRN

## 2011-11-09 MED ORDER — ACETAMINOPHEN 10 MG/ML IV SOLN
INTRAVENOUS | Status: AC
  Filled 2011-11-09: qty 100

## 2011-11-09 MED ORDER — MORPHINE SULFATE 4 MG/ML IJ SOLN
4.0000 mg | Freq: Once | INTRAMUSCULAR | Status: AC
  Administered 2011-11-09: 4 mg via INTRAVENOUS
  Filled 2011-11-09: qty 1

## 2011-11-09 MED ORDER — CEFAZOLIN SODIUM 1-5 GM-% IV SOLN
INTRAVENOUS | Status: AC
  Filled 2011-11-09: qty 50

## 2011-11-09 MED ORDER — HYDROMORPHONE HCL PF 1 MG/ML IJ SOLN
1.0000 mg | INTRAMUSCULAR | Status: DC | PRN
  Administered 2011-11-09: 1 mg via INTRAVENOUS
  Filled 2011-11-09 (×2): qty 1

## 2011-11-09 MED ORDER — ACYCLOVIR 400 MG PO TABS
400.0000 mg | ORAL_TABLET | ORAL | Status: DC
  Filled 2011-11-09 (×2): qty 1

## 2011-11-09 MED ORDER — DOUBLE ANTIBIOTIC 500-10000 UNIT/GM EX OINT
TOPICAL_OINTMENT | CUTANEOUS | Status: AC
  Filled 2011-11-09: qty 1

## 2011-11-09 MED ORDER — OXYCODONE HCL 5 MG PO TABS
5.0000 mg | ORAL_TABLET | ORAL | Status: DC | PRN
  Administered 2011-11-09: 5 mg via ORAL
  Filled 2011-11-09 (×2): qty 1

## 2011-11-09 MED ORDER — LACTATED RINGERS IV SOLN
INTRAVENOUS | Status: DC
  Administered 2011-11-09 (×3): via INTRAVENOUS

## 2011-11-09 MED ORDER — DEXTROSE 5 % IV SOLN
3.0000 g | INTRAVENOUS | Status: AC
  Administered 2011-11-09: 3 g via INTRAVENOUS
  Filled 2011-11-09: qty 3000

## 2011-11-09 MED ORDER — SODIUM CHLORIDE 0.9 % IV SOLN
INTRAVENOUS | Status: DC
  Administered 2011-11-09: 15:00:00 via INTRAVENOUS

## 2011-11-09 MED ORDER — DROPERIDOL 2.5 MG/ML IJ SOLN
0.6250 mg | INTRAMUSCULAR | Status: DC | PRN
  Filled 2011-11-09: qty 0.25

## 2011-11-09 MED ORDER — HYDROMORPHONE HCL PF 1 MG/ML IJ SOLN
0.2500 mg | INTRAMUSCULAR | Status: DC | PRN
  Administered 2011-11-09: 0.25 mg via INTRAVENOUS
  Administered 2011-11-09: 0.5 mg via INTRAVENOUS
  Administered 2011-11-09 (×2): 0.25 mg via INTRAVENOUS

## 2011-11-09 MED ORDER — ENOXAPARIN SODIUM 40 MG/0.4ML ~~LOC~~ SOLN
40.0000 mg | SUBCUTANEOUS | Status: DC
  Administered 2011-11-10 – 2011-11-11 (×2): 40 mg via SUBCUTANEOUS
  Filled 2011-11-09 (×3): qty 0.4

## 2011-11-09 MED ORDER — MIDAZOLAM HCL 5 MG/5ML IJ SOLN
INTRAMUSCULAR | Status: DC | PRN
  Administered 2011-11-09: 2 mg via INTRAVENOUS

## 2011-11-09 MED ORDER — METHOCARBAMOL 500 MG PO TABS
500.0000 mg | ORAL_TABLET | Freq: Four times a day (QID) | ORAL | Status: DC | PRN
  Administered 2011-11-10 – 2011-11-19 (×20): 500 mg via ORAL
  Filled 2011-11-09 (×20): qty 1

## 2011-11-09 MED ORDER — ONDANSETRON HCL 4 MG PO TABS: 4.0000 mg | ORAL_TABLET | Freq: Four times a day (QID) | ORAL | Status: DC | PRN

## 2011-11-09 MED ORDER — CHLORHEXIDINE GLUCONATE 4 % EX LIQD
60.0000 mL | Freq: Once | CUTANEOUS | Status: DC
  Filled 2011-11-09: qty 60

## 2011-11-09 MED ORDER — GABAPENTIN 300 MG PO CAPS
300.0000 mg | ORAL_CAPSULE | Freq: Three times a day (TID) | ORAL | Status: DC
  Administered 2011-11-10 – 2011-11-19 (×28): 300 mg via ORAL
  Filled 2011-11-09 (×31): qty 1

## 2011-11-09 MED ORDER — METHOCARBAMOL 100 MG/ML IJ SOLN
500.0000 mg | Freq: Four times a day (QID) | INTRAVENOUS | Status: DC | PRN
  Administered 2011-11-09 – 2011-11-17 (×2): 500 mg via INTRAVENOUS
  Filled 2011-11-09 (×2): qty 5

## 2011-11-09 MED ORDER — MAGIC MOUTHWASH
15.0000 mL | Freq: Four times a day (QID) | ORAL | Status: DC
  Administered 2011-11-10 – 2011-11-19 (×31): 15 mL via ORAL
  Filled 2011-11-09 (×41): qty 15

## 2011-11-09 MED ORDER — ACETAMINOPHEN 10 MG/ML IV SOLN
INTRAVENOUS | Status: DC | PRN
  Administered 2011-11-09: 1000 mg via INTRAVENOUS

## 2011-11-09 MED ORDER — ONDANSETRON HCL 4 MG/2ML IJ SOLN
INTRAMUSCULAR | Status: DC | PRN
  Administered 2011-11-09: 4 mg via INTRAVENOUS

## 2011-11-09 MED ORDER — AMITRIPTYLINE HCL 25 MG PO TABS
25.0000 mg | ORAL_TABLET | Freq: Every day | ORAL | Status: DC
  Administered 2011-11-10 – 2011-11-18 (×10): 25 mg via ORAL
  Filled 2011-11-09 (×11): qty 1

## 2011-11-09 MED ORDER — HYDROMORPHONE HCL PF 1 MG/ML IJ SOLN
INTRAMUSCULAR | Status: AC
  Filled 2011-11-09: qty 1

## 2011-11-09 MED ORDER — HYDROMORPHONE HCL PF 1 MG/ML IJ SOLN
0.5000 mg | Freq: Once | INTRAMUSCULAR | Status: AC
  Administered 2011-11-09: 0.5 mg via INTRAVENOUS
  Filled 2011-11-09: qty 1

## 2011-11-09 MED ORDER — PROPOFOL 10 MG/ML IV BOLUS
INTRAVENOUS | Status: DC | PRN
  Administered 2011-11-09: 150 mg via INTRAVENOUS

## 2011-11-09 MED ORDER — LACTATED RINGERS IV SOLN
INTRAVENOUS | Status: DC
  Administered 2011-11-09 – 2011-11-11 (×5): via INTRAVENOUS
  Administered 2011-11-11: 1000 mL via INTRAVENOUS
  Administered 2011-11-12: 50 mL/h via INTRAVENOUS
  Administered 2011-11-12: 125 mL/h via INTRAVENOUS
  Administered 2011-11-17: 19:00:00 via INTRAVENOUS
  Administered 2011-11-18: 125 mL/h via INTRAVENOUS

## 2011-11-09 MED ORDER — OXYCODONE HCL 5 MG PO TABS: 5.0000 mg | ORAL_TABLET | Freq: Once | ORAL | Status: DC | PRN

## 2011-11-09 MED ORDER — POLYETHYLENE GLYCOL 3350 17 G PO PACK
17.0000 g | PACK | Freq: Every day | ORAL | Status: DC | PRN
  Filled 2011-11-09: qty 1

## 2011-11-09 MED ORDER — ONDANSETRON HCL 4 MG/2ML IJ SOLN
4.0000 mg | Freq: Four times a day (QID) | INTRAMUSCULAR | Status: DC | PRN
  Administered 2011-11-10 – 2011-11-17 (×2): 4 mg via INTRAVENOUS
  Filled 2011-11-09 (×2): qty 2

## 2011-11-09 MED ORDER — OXYCODONE HCL 5 MG PO TABS
5.0000 mg | ORAL_TABLET | ORAL | Status: DC | PRN
  Administered 2011-11-09: 10 mg via ORAL
  Administered 2011-11-09: 5 mg via ORAL
  Administered 2011-11-10 – 2011-11-17 (×19): 10 mg via ORAL
  Filled 2011-11-09 (×20): qty 2

## 2011-11-09 MED ORDER — BISACODYL 5 MG PO TBEC
5.0000 mg | DELAYED_RELEASE_TABLET | Freq: Every day | ORAL | Status: DC | PRN
  Administered 2011-11-16: 5 mg via ORAL
  Filled 2011-11-09 (×2): qty 1

## 2011-11-09 SURGICAL SUPPLY — 54 items
BANDAGE ELASTIC 4 VELCRO ST LF (GAUZE/BANDAGES/DRESSINGS) ×2 IMPLANT
BANDAGE GAUZE ELAST BULKY 4 IN (GAUZE/BANDAGES/DRESSINGS) ×2 IMPLANT
BLADE SURG 10 STRL SS (BLADE) ×2 IMPLANT
BNDG COHESIVE 4X5 TAN STRL (GAUZE/BANDAGES/DRESSINGS) ×2 IMPLANT
BNDG COHESIVE 6X5 TAN STRL LF (GAUZE/BANDAGES/DRESSINGS) ×2 IMPLANT
CHLORAPREP W/TINT 26ML (MISCELLANEOUS) ×2 IMPLANT
CLAMP 2 3/5OST (Clamp) ×4 IMPLANT
CLAMP 5 HOLE (Clamp) ×2 IMPLANT
CLAMP PIN-ROD (Clamp) ×4 IMPLANT
CLAMP ROD-ROD (Clamp) ×4 IMPLANT
CLOTH BEACON ORANGE TIMEOUT ST (SAFETY) ×2 IMPLANT
COVER SURGICAL LIGHT HANDLE (MISCELLANEOUS) ×2 IMPLANT
CUFF TOURNIQUET SINGLE 34IN LL (TOURNIQUET CUFF) ×2 IMPLANT
CUFF TOURNIQUET SINGLE 44IN (TOURNIQUET CUFF) IMPLANT
DRAPE C-ARM 42X72 X-RAY (DRAPES) ×2 IMPLANT
DRAPE U-SHAPE 47X51 STRL (DRAPES) ×2 IMPLANT
DRSG ADAPTIC 3X8 NADH LF (GAUZE/BANDAGES/DRESSINGS) ×2 IMPLANT
DRSG PAD ABDOMINAL 8X10 ST (GAUZE/BANDAGES/DRESSINGS) ×4 IMPLANT
ELECT REM PT RETURN 9FT ADLT (ELECTROSURGICAL) ×2
ELECTRODE REM PT RTRN 9FT ADLT (ELECTROSURGICAL) ×1 IMPLANT
GAUZE XEROFORM 5X9 LF (GAUZE/BANDAGES/DRESSINGS) ×2 IMPLANT
GLOVE BIO SURGEON STRL SZ8 (GLOVE) ×2 IMPLANT
GLOVE BIOGEL PI IND STRL 8 (GLOVE) ×1 IMPLANT
GLOVE BIOGEL PI INDICATOR 8 (GLOVE) ×1
GOWN PREVENTION PLUS XLARGE (GOWN DISPOSABLE) ×2 IMPLANT
GOWN STRL NON-REIN LRG LVL3 (GOWN DISPOSABLE) ×4 IMPLANT
KIT BASIN OR (CUSTOM PROCEDURE TRAY) ×2 IMPLANT
KIT ROOM TURNOVER OR (KITS) ×2 IMPLANT
MANIFOLD NEPTUNE II (INSTRUMENTS) ×2 IMPLANT
NEEDLE 22X1 1/2 (OR ONLY) (NEEDLE) IMPLANT
NS IRRIG 1000ML POUR BTL (IV SOLUTION) ×2 IMPLANT
PACK ORTHO EXTREMITY (CUSTOM PROCEDURE TRAY) ×2 IMPLANT
PAD ARMBOARD 7.5X6 YLW CONV (MISCELLANEOUS) ×4 IMPLANT
PAD CAST 4YDX4 CTTN HI CHSV (CAST SUPPLIES) ×1 IMPLANT
PADDING CAST COTTON 4X4 STRL (CAST SUPPLIES) ×1
PADDING CAST COTTON 6X4 STRL (CAST SUPPLIES) ×2 IMPLANT
ROD CARBON FIBER 300X9.5MM (Rod) ×4 IMPLANT
SCREW BONE SELF DRILL/TAP5X150 (Screw) ×4 IMPLANT
SCREW TRANSFIXING 5MMX350MM (Screw) ×2 IMPLANT
SOAP 2 % CHG 4 OZ (WOUND CARE) ×2 IMPLANT
SPLINT PLASTER CAST XFAST 5X30 (CAST SUPPLIES) ×1 IMPLANT
SPLINT PLASTER XFAST SET 5X30 (CAST SUPPLIES) ×1
SPONGE GAUZE 4X4 12PLY (GAUZE/BANDAGES/DRESSINGS) ×2 IMPLANT
SPONGE LAP 18X18 X RAY DECT (DISPOSABLE) ×2 IMPLANT
STAPLER VISISTAT 35W (STAPLE) ×2 IMPLANT
SUCTION FRAZIER TIP 10 FR DISP (SUCTIONS) ×2 IMPLANT
SUT PROLENE 3 0 PS 2 (SUTURE) ×2 IMPLANT
SUT VIC AB 3-0 PS2 18 (SUTURE) ×1
SUT VIC AB 3-0 PS2 18XBRD (SUTURE) ×1 IMPLANT
SYR CONTROL 10ML LL (SYRINGE) ×2 IMPLANT
TOWEL OR 17X24 6PK STRL BLUE (TOWEL DISPOSABLE) ×2 IMPLANT
TOWEL OR 17X26 10 PK STRL BLUE (TOWEL DISPOSABLE) ×2 IMPLANT
TUBE CONNECTING 12X1/4 (SUCTIONS) ×2 IMPLANT
WATER STERILE IRR 1000ML POUR (IV SOLUTION) ×2 IMPLANT
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
## 2011-11-09 NOTE — Transfer of Care (Signed)
Immediate Anesthesia Transfer of Care Note  Patient: Larry Randall  Procedure(s) Performed: Procedure(s) (LRB) with comments: EXTERNAL FIXATION LEG (Left)  Patient Location: PACU  Anesthesia Type: General  Level of Consciousness: awake, alert  and oriented  Airway & Oxygen Therapy: Patient Spontanous Breathing and Patient connected to nasal cannula oxygen  Post-op Assessment: Report given to PACU RN and Post -op Vital signs reviewed and stable  Post vital signs: Reviewed and stable  Complications: No apparent anesthesia complications

## 2011-11-09 NOTE — Anesthesia Procedure Notes (Signed)
Procedure Name: Intubation Date/Time: 11/09/2011 4:47 PM Performed by: Arlice Colt B Pre-anesthesia Checklist: Patient identified, Emergency Drugs available, Suction available, Patient being monitored and Timeout performed Patient Re-evaluated:Patient Re-evaluated prior to inductionOxygen Delivery Method: Circle system utilized Preoxygenation: Pre-oxygenation with 100% oxygen Intubation Type: IV induction and Rapid sequence Laryngoscope Size: Mac and 3 Grade View: Grade I Tube type: Oral Tube size: 7.5 mm Number of attempts: 1 Airway Equipment and Method: Stylet Placement Confirmation: ETT inserted through vocal cords under direct vision,  positive ETCO2 and breath sounds checked- equal and bilateral Secured at: 22 cm Tube secured with: Tape Dental Injury: Teeth and Oropharynx as per pre-operative assessment

## 2011-11-09 NOTE — Brief Op Note (Signed)
11/08/2011 - 11/09/2011  6:08 PM  PATIENT:  Larry Randall  61 y.o. male  PRE-OPERATIVE DIAGNOSIS:  1.  Left tibial pilon fracture 2.  Left calcaneus fracture 3.  Right calcaneus fracture  POST-OPERATIVE DIAGNOSIS:  same  Procedure(s): 1.  Closed reduction of left tibial pilon fracture 2.  Application of multiplane external fixator to the L LE 3.  Fluoro 4.  Application of short leg splint to the R LE  SURGEON:  Toni Arthurs, MD  ASSISTANT: n/a  ANESTHESIA:   General  EBL:  minimal   TOURNIQUET:  n/a  COMPLICATIONS:  None apparent  DISPOSITION:  Extubated, awake and stable to recovery.  DICTATION ID:  [id number]

## 2011-11-09 NOTE — Anesthesia Postprocedure Evaluation (Signed)
  Anesthesia Post-op Note  Patient: Larry Randall  Procedure(s) Performed: Procedure(s) (LRB) with comments: EXTERNAL FIXATION LEG (Left)  Patient Location: PACU  Anesthesia Type: General  Level of Consciousness: awake  Airway and Oxygen Therapy: Patient Spontanous Breathing  Post-op Pain: mild  Post-op Assessment: Post-op Vital signs reviewed  Post-op Vital Signs: stable  Complications: No apparent anesthesia complications

## 2011-11-09 NOTE — Anesthesia Preprocedure Evaluation (Signed)
Anesthesia Evaluation  Patient identified by MRN, date of birth, ID band Patient awake    Reviewed: Allergy & Precautions, H&P , NPO status , Patient's Chart, lab work & pertinent test results  Airway Mallampati: I TM Distance: >3 FB Neck ROM: Full    Dental  (+) Teeth Intact and Dental Advisory Given   Pulmonary neg pulmonary ROS,    Pulmonary exam normal       Cardiovascular + CAD     Neuro/Psych PSYCHIATRIC DISORDERS Anxiety Depression negative neurological ROS     GI/Hepatic Neg liver ROS, hiatal hernia, GERD-  Medicated,  Endo/Other  negative endocrine ROS  Renal/GU      Musculoskeletal   Abdominal   Peds  Hematology   Anesthesia Other Findings   Reproductive/Obstetrics                           Anesthesia Physical Anesthesia Plan  ASA: III  Anesthesia Plan: General   Post-op Pain Management:    Induction: Intravenous  Airway Management Planned: Oral ETT  Additional Equipment:   Intra-op Plan:   Post-operative Plan: Extubation in OR  Informed Consent: I have reviewed the patients History and Physical, chart, labs and discussed the procedure including the risks, benefits and alternatives for the proposed anesthesia with the patient or authorized representative who has indicated his/her understanding and acceptance.   Dental advisory given  Plan Discussed with: CRNA, Anesthesiologist and Surgeon  Anesthesia Plan Comments:         Anesthesia Quick Evaluation

## 2011-11-09 NOTE — ED Provider Notes (Signed)
History     CSN: [id number]  Arrival date & time 11/08/11  2348   First MD Initiated Contact with Patient 11/09/11 0005      Chief Complaint  Patient presents with  . Facial Burn  . Fall    (Consider location/radiation/quality/duration/timing/severity/associated sxs/prior treatment) HPI 61 year old male presents to emergency room via EMS after fall and burn. Patient reports he was cleaning out a chimney when he suffered a flash burn, then fell from the ladder. He is unsure how high up he was when he fell. Patient reports striking his head on the ground, which he reports was concrete. He denies LOC. Patient is complaining of bilateral ankle and foot pain, pain around his coccyx. He denies headache, neck pain. Patient with burn to left face right hand without blistering. Patient is complaining of nausea. Past Medical History  Diagnosis Date  . Anxiety   . Arthritis   . GERD (gastroesophageal reflux disease)   . Hyperlipemia   . CAD (coronary artery disease) FOLLOWED PCP  DR Barnetta Chapel  [facility])    PT DENIES CARDIAC SYMPTOMS  . History of MI (myocardial infarction) 1996--  S/P ANGIOPLASTY X3 STENTS  . S/P primary angioplasty with coronary stent X3 STENTS  1996  POST MI    PER PT BARE METAL STENTS  . H/O hiatal hernia   . History of CHF (congestive heart failure)   . History of gastric ulcer   . Atypical rash OF MOUTH -- PER PT UNKNOWN ETIOLOGY    RX MOUTHWASH BID-  PT STATES POSSIBLE FROM GERD  . History of kidney stones   . Depression     Past Surgical History  Procedure Date  . Tonsillectomy and adenoidectomy CHILD  . Extracorporeal shock wave lithotripsy 05-02-2009  . Transurethral resection of prostate X2   YRS AGO    BEFORE 1996  . Coronary angioplasty with stent placement 1996    PER PT X3 STENTS (BARE METAL)  . Transurethral resection of prostate 09/07/2011    Procedure: TRANSURETHRAL RESECTION OF THE PROSTATE WITH GYRUS INSTRUMENTS;  Surgeon:  Marcine Matar, MD;  Location: [facility];  Service: Urology;  Laterality: N/A;  2 HRS NEEDS A BED  gyrus loop         Family History  Problem Relation Age of Onset  . Heart disease Father   . Colon cancer Neg Hx   . Esophageal cancer Neg Hx   . Rectal cancer Neg Hx   . Stomach cancer Neg Hx     History  Substance Use Topics  . Smoking status: Former Smoker -- 1.0 packs/day for 30 years    Types: Cigarettes    Quit date: 09/02/1994  . Smokeless tobacco: Never Used  . Alcohol Use: No      Review of Systems  All other systems reviewed and are negative.    Allergies  Review of patient's allergies indicates no known allergies.  Home Medications   Current Outpatient Rx  Name Route Sig Dispense Refill  . ACYCLOVIR 400 MG PO TABS Oral Take 400 mg by mouth every 4 (four) hours while awake.    Marland Kitchen MAGIC MOUTHWASH Oral Take 15 mLs by mouth 4 (four) times daily.    . AMBULATORY NON FORMULARY MEDICATION  Medication Name: Intestinal Soothe & Build 1 capsule daily    . AMBULATORY NON FORMULARY MEDICATION  Medication Name: Gastrex  1 capsule daily    . AMITRIPTYLINE HCL 25 MG PO TABS Oral Take 25  mg by mouth at bedtime.    Marland Kitchen CIPROFLOXACIN HCL 500 MG PO TABS Oral Take 500 mg by mouth 2 (two) times daily.    Marland Kitchen ESOMEPRAZOLE MAGNESIUM 40 MG PO CPDR Oral Take 1 capsule (40 mg total) by mouth daily before breakfast. 30 capsule 1  . GABAPENTIN 300 MG PO CAPS Oral Take 300 mg by mouth 3 (three) times daily.    . L-GLUTAMINE PO Oral Take 1 capsule by mouth daily.    Marland Kitchen DIGESTIVE HEALTH PROBIOTIC PO CAPS Oral Take 1 tablet by mouth daily.    Marland Kitchen MAGNESIUM 250 MG PO TABS Oral Take 1 tablet by mouth daily.    Marland Kitchen ZINC PO Oral Take 20 mg by mouth daily.    Marland Kitchen PHILLIPS COLON HEALTH PO CAPS Oral Take 1 capsule by mouth daily.    Marland Kitchen SILVER SULFADIAZINE 1 % EX CREA Topical Apply 1 application topically daily.    . SULFAMETHOXAZOLE-TRIMETHOPRIM 800-160 MG PO TABS Oral Take 1 tablet  by mouth 2 (two) times daily.      BP 141/82  Pulse 81  Temp 97.5 F (36.4 C) (Oral)  Resp 20  SpO2 98%  Physical Exam  Nursing note and vitals reviewed. Constitutional: He is oriented to person, place, and time. He appears well-developed and well-nourished. No distress.  HENT:  Head: Normocephalic and atraumatic.  Nose: Nose normal.  Mouth/Throat: Oropharynx is clear and moist.  Eyes: Conjunctivae normal and EOM are normal. Pupils are equal, round, and reactive to light.  Neck: Normal range of motion. Neck supple. No JVD present. No tracheal deviation present.       No step-off no crepitus  Cardiovascular: Normal rate, regular rhythm, normal heart sounds and intact distal pulses.  Exam reveals no gallop and no friction rub.   No murmur heard. Pulmonary/Chest: Effort normal and breath sounds normal. No stridor. No respiratory distress. He has no wheezes. He has no rales. He exhibits no tenderness.  Abdominal: Soft. Bowel sounds are normal. He exhibits no distension. There is no tenderness. There is no rebound and no guarding.  Musculoskeletal: He exhibits tenderness. He exhibits no edema.       Swelling noted to left ankle. Tenderness to palpation with left ankle, right ankle right foot.  Lumbar spine without step-off or crepitus nontender  Lymphadenopathy:    He has no cervical adenopathy.  Neurological: He is alert and oriented to person, place, and time. He exhibits normal muscle tone. Coordination normal.  Skin: Skin is warm and dry. No rash noted. No erythema. No pallor.       Erythema noted to left side of face, right hand consistent with first-degree burn    ED Course  Procedures (including critical care time)  Labs Reviewed  CBC WITH DIFFERENTIAL - Abnormal; Notable for the following:    WBC 12.5 (*)     HCT 38.8 (*)     Neutrophils Relative 78 (*)     Neutro Abs 9.8 (*)     All other components within normal limits  BASIC METABOLIC PANEL - Abnormal; Notable for  the following:    Glucose, Bld 136 (*)     GFR calc non Af Amer 78 (*)     All other components within normal limits  URINALYSIS, ROUTINE W REFLEX MICROSCOPIC - Abnormal; Notable for the following:    APPearance CLOUDY (*)     Ketones, ur 40 (*)     Leukocytes, UA SMALL (*)     All other components  within normal limits  URINE MICROSCOPIC-ADD ON - Abnormal; Notable for the following:    Squamous Epithelial / LPF FEW (*)     Bacteria, UA FEW (*)     Casts HYALINE CASTS (*)     All other components within normal limits  PROTIME-INR  SAMPLE TO BLOOD BANK   Dg Chest 1 View  11/09/2011  *RADIOLOGY REPORT*  Clinical Data: Fall.  Low back pain.  CHEST - 1 VIEW  Comparison: None.  Findings:  Cardiopericardial silhouette within normal limits. Mediastinal contours normal. Trachea midline.  No airspace disease or effusion.  Paraspinal lines appear within normal limits.  IMPRESSION: No acute abnormality.   Original Report Authenticated By: Andreas Newport, M.D.    Dg Lumbar Spine Complete  11/09/2011  *RADIOLOGY REPORT*  Clinical Data: Fall.  Low back pain.  LUMBAR SPINE - COMPLETE 4+ VIEW  Comparison: Radiographs 05/10/2009. MRI 01/24/2011.  Findings: Five lumbar type vertebral bodies are present.  There is right lateral spurring at the inferior endplate of L2 which appears similar to prior radiograph of 2011.  Vertebral body height is preserved.  Sacral arcades appear intact. SI joints appears symmetric.  No pars defects are present.  Mild degenerative disc disease.  Aortoiliac atherosclerosis.  L1 shows mild loss of vertebral body height which is similar to prior MRI 01/24/2011.  No convincing evidence of acute lumbar compression fracture.  IMPRESSION: No acute osseous abnormality.  Unchanged L1 and L2 deformity.   Original Report Authenticated By: Andreas Newport, M.D.    Dg Tibia/fibula Left  11/09/2011  *RADIOLOGY REPORT*  Clinical Data: Fall.  Left ankle pain.  LEFT TIBIA AND FIBULA - 2 VIEW   Comparison: None.  Findings: Mildly displaced pilon fracture of the left distal tibia. No displaced fibular fracture.  The proximal tibiofibular joint grossly appears within normal limits.  IMPRESSION: Left tibial pilon fracture.   Original Report Authenticated By: Andreas Newport, M.D.    Dg Ankle Complete Left  11/09/2011  *RADIOLOGY REPORT*  Clinical Data: Fall.  Left ankle pain.  LEFT ANKLE COMPLETE - 3+ VIEW  Comparison: None.  Findings: A pilon fracture of the left tibia is present.  Intra- articular extension is present.  Fracture is mildly displaced. Fracture extends into the medial malleolus.  The distal fibula appears intact.  The talar dome grossly appears intact.  There is also an oblique fracture through the body of the calcaneus that appears to extend in the middle subtalar joint.  Distraction of the subtalar joints.  IMPRESSION: 1.  Left tibial pilon fracture. 2.  Nondisplaced calcaneus fracture. 3.  Subtalar joint distraction, likely subluxation.  CT of the ankle would be useful for further assessment.   Original Report Authenticated By: Andreas Newport, M.D.    Dg Ankle Complete Right  11/09/2011  *RADIOLOGY REPORT*  Clinical Data: Fall from ladder.  Bilateral ankle pain.  RIGHT ANKLE - COMPLETE 3+ VIEW  Comparison: None.  Findings: The ankle mortise appears congruent.  The distal tibia and fibula appear intact.  There is a comminuted calcaneal fracture with extension into both middle and posterior subtalar joints. Fracture fragments are mildly displaced.  The fracture involves the calcaneal body, waist and anterior process of the calcaneus. Distraction of the subtalar joints.  IMPRESSION: Comminuted mildly displaced intra-articular calcaneal fracture.   Original Report Authenticated By: Andreas Newport, M.D.    Ct Head Wo Contrast  11/09/2011  *RADIOLOGY REPORT*  Clinical Data:  61 year old male status post fall and facial burn. Pain.  CT  HEAD WITHOUT CONTRAST CT CERVICAL SPINE WITHOUT  CONTRAST  Technique:  Multidetector CT imaging of the head and cervical spine was performed following the standard protocol without intravenous contrast.  Multiplanar CT image reconstructions of the cervical spine were also generated.  Comparison:  Orbit radiographs 01/23/2011.  CT HEAD  Findings: Visualized orbit soft tissues are within normal limits. Visualized scalp soft tissues appear within normal limits.  Minor bubbly opacity in the visualized right maxillary sinus. Minor ethmoid mucosal thickening.  Hypoplastic left mastoid air cells.  Other Visualized paranasal sinuses and mastoids are clear. No acute osseous abnormality identified.  No ventriculomegaly. No midline shift, mass effect, or evidence of mass lesion.  No evidence of cortically based acute infarction identified.  Gray-white matter differentiation is within normal limits throughout the brain.  No suspicious intracranial vascular hyperdensity. No acute intracranial hemorrhage identified.  IMPRESSION: 1. Normal noncontrast CT appearance of the brain. No acute traumatic injury identified. 2.  Cervical spine findings are below.  CT CERVICAL SPINE  Findings: Visualized skull base is intact.  No atlanto-occipital dissociation.  Trace anterolisthesis at C7-T1.  C5-C6 and C6-C7 disc space loss with endplate spurring.  Subsequent mild degree of degenerative spinal stenosis. Bilateral posterior element alignment is within normal limits.  No acute cervical fracture identified.  Apical lung emphysema greater on the right. Visualized paraspinal soft tissues are within normal limits.  IMPRESSION: 1. No acute fracture or listhesis identified in the cervical spine. Ligamentous injury is not excluded. 2.  Degenerative changes.   Original Report Authenticated By: Harley Hallmark, M.D.    Ct Cervical Spine Wo Contrast  11/09/2011  *RADIOLOGY REPORT*  Clinical Data:  61 year old male status post fall and facial burn. Pain.  CT HEAD WITHOUT CONTRAST CT CERVICAL SPINE  WITHOUT CONTRAST  Technique:  Multidetector CT imaging of the head and cervical spine was performed following the standard protocol without intravenous contrast.  Multiplanar CT image reconstructions of the cervical spine were also generated.  Comparison:  Orbit radiographs 01/23/2011.  CT HEAD  Findings: Visualized orbit soft tissues are within normal limits. Visualized scalp soft tissues appear within normal limits.  Minor bubbly opacity in the visualized right maxillary sinus. Minor ethmoid mucosal thickening.  Hypoplastic left mastoid air cells.  Other Visualized paranasal sinuses and mastoids are clear. No acute osseous abnormality identified.  No ventriculomegaly. No midline shift, mass effect, or evidence of mass lesion.  No evidence of cortically based acute infarction identified.  Gray-white matter differentiation is within normal limits throughout the brain.  No suspicious intracranial vascular hyperdensity. No acute intracranial hemorrhage identified.  IMPRESSION: 1. Normal noncontrast CT appearance of the brain. No acute traumatic injury identified. 2.  Cervical spine findings are below.  CT CERVICAL SPINE  Findings: Visualized skull base is intact.  No atlanto-occipital dissociation.  Trace anterolisthesis at C7-T1.  C5-C6 and C6-C7 disc space loss with endplate spurring.  Subsequent mild degree of degenerative spinal stenosis. Bilateral posterior element alignment is within normal limits.  No acute cervical fracture identified.  Apical lung emphysema greater on the right. Visualized paraspinal soft tissues are within normal limits.  IMPRESSION: 1. No acute fracture or listhesis identified in the cervical spine. Ligamentous injury is not excluded. 2.  Degenerative changes.   Original Report Authenticated By: Harley Hallmark, M.D.    Ct Ankle Right Wo Contrast  11/09/2011  *RADIOLOGY REPORT*  Clinical Data: Fall.  Calcaneal fracture.  CT OF THE RIGHT ANKLE WITH CONTRAST  Technique:  Multidetector CT  imaging was performed following the standard protocol during bolus administration of intravenous contrast.  Comparison: None.  Findings: The calcaneus is essentially shattered.  Comminuted mildly displaced fractures involve all portions of the calcaneus extending from tuberosity through the waist and into the anterior process.  Fractures are present through the base of the sustentaculum tali.  There is extension into the posterior, middle and anterior subtalar joints with extension to the calcaneocuboid joint.  The cuboid bone and navicular bones appear intact.  Talus is intact.  Diffuse swelling is present about the ankle.  The Achilles tendon remains intact.  Plantar fascia appears within normal limits grossly by CT.  There is no peroneal tendon entrapment although the peroneus longus tendon does abut some of the comminuted fracture planes.  Similarly, the flexor Hallucis longus tendon abuts the fracture plane without entrapment. Anterior tendon groups appear intact.  On sagittal images, there is loss of the normal calcaneal angle with depression of the posterior calcaneus 3 mm.  IMPRESSION: Shattered calcaneus with intra-articular extension involving the subtalar joints and calcaneocuboid joint.  The fracture fragments are mildly displaced with depression of the posterior calcaneus.   Original Report Authenticated By: Andreas Newport, M.D.    Ct Ankle Left Wo Contrast  11/09/2011  *RADIOLOGY REPORT*  Clinical Data: Fall.  Ankle pain.  CT OF THE LEFT ANKLE WITHOUT CONTRAST  Technique:  Multidetector CT imaging was performed according to the standard protocol. Multiplanar CT image reconstructions were also generated.  Comparison: Radiographs earlier today.  Findings: Tibial pilon fracture.  There is anterior displacement of the anterior tibial plafond fragment with articular surface gap measuring 19 mm. Anterior subluxation of the talus relative to the posterior tibial plafond and posterior malleolus.  The  lateral malleolus is slightly anterior positioned relative to the fibular notch.  Bone fragments are present in the ankle joint with hemarthrosis.  There is mild posterior angulation of the anterior tibial plafond fracture.  The medial malleolar fracture is comminuted.  The distal fibula and lateral malleolus remain intact. Posterior medial malleolar fracture is displaced 12 mm.  Prominent os trigonum is present.  The talar neck is intact.  There is a nondisplaced oblique fracture through the calcaneal body extending to the talar sulcus and minimally into the medial aspect of the posterior subtalar joint.  Calcaneal spurs are incidentally noted.  Cuboid and navicular bones are within normal limits.  On coronal images, there is distraction of the medial ankle mortise.  Sustentaculum tali appears intact. There is no tendinous entrapment associated with either the pilon fracture or nondisplaced calcaneal fracture  IMPRESSION: 1. Pilon fracture of the distal tibia with anterior position of the talus relative to the posterior plafond. 2.  Pilon fracture is comminuted and maximal displacement involves the posterior medial malleolus with distraction of 12 mm.  Widening of the medial ankle mortise. 3.  Nondisplaced calcaneal body fracture extending into the calcaneal sulcus and posterior subtalar joint.   Original Report Authenticated By: Andreas Newport, M.D.    Dg Foot Complete Left  11/09/2011  *RADIOLOGY REPORT*  Clinical Data: Fall from ladder.  Ankle and foot pain.  LEFT FOOT - COMPLETE 3+ VIEW  Comparison: None.  Findings: Pilon fracture of the tibia is noted.  Midfoot grossly appears within normal limits.  Forefoot demonstrates a degenerated likely bipartite lateral sesamoid of the great toe.  Metatarsals are intact.  No forefoot fractures identified.  Distraction of the subtalar joints on the lateral view.  Calcaneal fracture better seen on ankle  radiographs.  IMPRESSION: No acute abnormality in the forefoot.   Pilon fracture and calcaneal fracture again noted.   Original Report Authenticated By: Andreas Newport, M.D.    Dg Foot Complete Right  11/09/2011  *RADIOLOGY REPORT*  Clinical Data: Fall.  Right foot pain.  RIGHT FOOT COMPLETE - 3+ VIEW  Comparison: None.  Findings: Anatomic alignment bones of the right foot.  Moderate right first MTP joint osteoarthritis.  No displaced fracture in the forefoot.  Comminuted calcaneal fracture is noted.  IMPRESSION: Comminuted calcaneal fracture.  Intact forefoot.   Original Report Authenticated By: Andreas Newport, M.D.      Date: 11/09/2011  Rate: 50  Rhythm: sinus bradycardia  QRS Axis: normal  Intervals: normal  ST/T Wave abnormalities: normal  Conduction Disutrbances:none  Narrative Interpretation:   Old EKG Reviewed: unchanged   1. Right calcaneal fracture   2. Left calcaneal fracture   3. Tibial fracture   4. First degree burn       MDM  61 year old male status post fall to with first degree burn. To have CT head, C-spine, x-rays of bilateral lower extremities  Patient found to have bilateral calcaneus fractures, left distal tibia fracture. X-rays were performed of his lower back upon finding of this fractures for concern for compression fractures. Discussed with Dr. supple on call for orthopedics. He will admit the patient to the hospital and plans for orthopedic repair       Olivia Mackie, MD 11/09/11 [phone]

## 2011-11-10 MED ORDER — ACYCLOVIR 200 MG PO CAPS
400.0000 mg | ORAL_CAPSULE | ORAL | Status: DC
  Administered 2011-11-10 – 2011-11-19 (×40): 400 mg via ORAL
  Filled 2011-11-10 (×55): qty 2

## 2011-11-10 NOTE — Progress Notes (Signed)
Utilization review completed. Dala Breault, RN, BSN. 

## 2011-11-10 NOTE — Op Note (Signed)
NAMEBRADY, SCHILLER NO.:  [id number]  MEDICAL RECORD NO.:  [id number]  LOCATION:  5N31C                        FACILITY:  [facility]  PHYSICIAN:  Toni Arthurs, MD        DATE OF BIRTH:  08-24-1950  DATE OF PROCEDURE:  11/09/2011 DATE OF DISCHARGE:                              OPERATIVE REPORT   PREOPERATIVE DIAGNOSES: 1. Left tibial pilon fracture. 2. Left calcaneus fracture. 3. Right calcaneus fracture.  POSTOPERATIVE DIAGNOSES: 1. Left tibial pilon fracture. 2. Left calcaneus fracture. 3. Right calcaneus fracture.  PROCEDURES: 1. Closed reduction of left tibial pilon fracture. 2. Application of multiplane external fixator to the left lower     extremity. 3. Intraoperative interpretation of fluoroscopic imaging. 4. Application of short-leg splint to the right lower extremity.  SURGEON:  Toni Arthurs, MD  ANESTHESIA:  General.  ESTIMATED BLOOD LOSS:  Minimal.  TOURNIQUET TIME:  Zero.  COMPLICATIONS:  None apparent.  DISPOSITION:  Extubated, awake and stable to recovery.  INDICATIONS FOR PROCEDURE:  The patient is a 61 year old male with past medical history significant for coronary artery disease.  He was at work yesterday when he fell off of a ladder approximately 8 or 10 feet landing on both lower extremities.  He was admitted by Dr. Rennis Chris after appropriate Orthopedic evaluation.  He presents now for application of an external fixator to the left lower extremity due to his comminuted displaced and shortened distal tibia fracture.  He understands the risks, benefits, the alternative treatment options and elects surgical treatment.  He specifically understands the need for additional surgery on both lower extremities in the future as well as risks of bleeding, infection, nerve damage, blood clots, need for additional surgery, amputation, and death.  PROCEDURE IN DETAIL:  After preoperative consent was obtained and the correct operative site was  identified, the patient was brought to the operating room and placed supine on the operating table, general anesthesia was induced, preoperative antibiotics were administered, surgical time-out was taken.  Left lower extremity was prepped and draped in standard sterile fashion with tourniquet around the thigh.  A longitudinal incision was made over the tibial crest at the midshaft of the tibia.  A 3.2-mm drill bit was used to drill through the tibia and a 5-mm Schanz pin was inserted in bicortical fashion.  The multi-pin clamp was then applied and used as a template to insert a second parallel screw distal to the first in parallel fashion.  The AP and lateral fluoroscopic views showed appropriate position of both pins.  The multi- pin clamp was then applied and tightened.  A lateral view of the heel was obtained.  A stab incision was used after confirming the appropriate location on the lateral view.  This was made at the lateral aspect of the heel.  A 5-mm centrally-threaded Schanz pin was then inserted from lateral to medial making a stab incision as the medial skin was tented. The central threads were advanced until they engaged the calcaneus.  A lateral fluoroscopic view confirmed appropriate position of the pin. Bars were then applied from the proximal multi-pin clamps to the distal pin using bar-to-bar clamps and bar-to-pin clamps.  Traction was  then applied to the distal pin under live fluoroscopy.  This confirmed appropriate reduction of the fracture on the lateral view.  All of the pins were tightened.  Final AP and lateral views showed appropriate reduction of the fracture and appropriate position of all of the pins. The pin sites were dressed with Xeroform gauze and Kerlix.  Cast padding was then applied about the ankle and hind foot followed by an Ace wrap. All of the clamps were then finally tightened one last time.  The sterile field was taken down.  The right lower  extremity was then examined.  The skin at the hind foot was noted to wrinkle appropriately with minimal swelling and no significant blisters.  A short-leg well-padded splint was then applied with the ankle held in a neutral position.  The patient was then awakened from anesthesia and transported to the recovery room in stable condition.  FOLLOWUP PLAN:  The patient will be taken back up to [address] [place].  He will need to return to the operating room hopefully on Thursday for ORIF of the calcaneus and likely next week for ORIF of the left distal tibia.     Toni Arthurs, MD     JH/MEDQ  D:  11/09/2011  T:  11/10/2011  Job:  [id number]

## 2011-11-10 NOTE — Evaluation (Signed)
Occupational Therapy Evaluation Patient Details Name: Larry Randall MRN: [id number] DOB: [birth year] Today's Date: 11/10/2011 Time: 1110-1130 OT Time Calculation (min): 20 min  OT Assessment / Plan / Recommendation Clinical Impression  61 yo male s/p fall from ladder with BIL NWB LE (Lt external fixator and Rt short splint) that could benefit from skilled OT acute. Recommend HHOT for d/c home    OT Assessment  Patient needs continued OT Services    Follow Up Recommendations  Home health OT    Barriers to Discharge      Equipment Recommendations  Rolling walker with 5" wheels;Other (comment) (drop arm 3n1, elevated leg rest due to NWB BIL LE)    Recommendations for Other Services    Frequency  Min 2X/week    Precautions / Restrictions Precautions Precautions: Fall;Other (comment) (BIL LE NWB) Restrictions Weight Bearing Restrictions: Yes RLE Weight Bearing: Non weight bearing LLE Weight Bearing: Non weight bearing   Pertinent Vitals/Pain 2-3 out 10 bil LE    ADL  Toilet Transfer: Simulated;+2 Total assistance Toilet Transfer: Patient Percentage: 80% Toilet Transfer Method: Anterior-posterior Toilet Transfer Equipment: Drop arm bedside commode Transfers/Ambulation Related to ADLs: anterior posterior transfer - min v/c and pt following commands and exceeding expectations. ADL Comments: Pt positioned in chair with bil LE on bed for support and elevated on pillows    OT Diagnosis: Generalized weakness;Acute pain  OT Problem List: Decreased strength;Decreased activity tolerance;Impaired balance (sitting and/or standing);Decreased safety awareness;Decreased knowledge of use of DME or AE;Decreased knowledge of precautions;Pain OT Treatment Interventions: Self-care/ADL training;DME and/or AE instruction;Therapeutic activities;Patient/family education;Balance training   OT Goals Acute Rehab OT Goals OT Goal Formulation: With patient Time For Goal Achievement:  11/24/11 Potential to Achieve Goals: Good ADL Goals Pt Will Perform Grooming: with set-up;Sitting, chair;Supported ADL Goal: Grooming - Progress: Goal set today Pt Will Perform Upper Body Bathing: with set-up;Sitting, chair ADL Goal: Upper Body Bathing - Progress: Goal set today Pt Will Perform Lower Body Bathing: with min assist;Sitting, chair;Sitting, edge of bed;with adaptive equipment ADL Goal: Lower Body Bathing - Progress: Goal set today Pt Will Perform Upper Body Dressing: with set-up;Sitting, chair;Supported ADL Goal: Location manager Dressing - Progress: Goal set today Pt Will Perform Lower Body Dressing: with min assist;Sitting, bed;with adaptive equipment ADL Goal: Lower Body Dressing - Progress: Goal set today Pt Will Transfer to Toilet: with min assist;Anterior-posterior transfer;3-in-1 ADL Goal: Toilet Transfer - Progress: Goal set today Pt Will Perform Toileting - Hygiene: with min assist;Leaning right and/or left on 3-in-1/toilet ADL Goal: Toileting - Hygiene - Progress: Goal set today  Visit Information  Last OT Received On: 11/10/11 Assistance Needed: +2 PT/OT Co-Evaluation/Treatment: Yes    Subjective Data  Subjective: "I have worked 7 days a week"- pt not completely disappointed to have a day off  Patient Stated Goal: to return to daughters home to have (A) at d/c   Prior Functioning  Vision/Perception  Home Living Lives With: Alone Available Help at Discharge: Family (plans to stay at daughter's house; daughter has 2 y/o)) Type of Home: House Home Access: Stairs to enter Secretary/administrator of Steps: 5 Home Layout: Two level Bathroom Shower/Tub:  (known at this time) Firefighter: Standard Prior Function Level of Independence: Independent Able to Take Stairs?: Yes Driving: Yes Vocation: Full time employment Communication Communication: No difficulties Dominant Hand: Right      Cognition  Overall Cognitive Status: Appears within functional limits  for tasks assessed/performed Arousal/Alertness: Awake/alert Orientation Level: Appears intact for tasks assessed Behavior During  Session: [facility] for tasks performed    Extremity/Trunk Assessment Right Upper Extremity Assessment RUE ROM/Strength/Tone: Within functional levels Left Upper Extremity Assessment LUE ROM/Strength/Tone: Within functional levels Right Lower Extremity Assessment RLE ROM/Strength/Tone: Deficits;Unable to fully assess;Due to pain RLE ROM/Strength/Tone Deficits: Pt atleast 2+/5 gross with quad Left Lower Extremity Assessment LLE ROM/Strength/Tone: Deficits;Unable to fully assess;Due to pain LLE ROM/Strength/Tone Deficits: At least 2+/5 quad. Trunk Assessment Trunk Assessment: Normal   Mobility  Shoulder Instructions  Bed Mobility Bed Mobility: Supine to Sit Supine to Sit: 4: Min assist;HOB flat Sitting - Scoot to Edge of Bed: 1: +2 Total assist Sitting - Scoot to Edge of Bed: Patient Percentage: 80% Details for Bed Mobility Assistance: Pt completed anterior posterior from supine position to chair. pt using BIL UE to (A) with mobility. Pt required (A) for BIL LE due to external fix on Lt and Rt splint.        Exercise     Balance Balance Balance Assessed: Yes Static Sitting Balance Static Sitting - Balance Support: Bilateral upper extremity supported (long sitting in bed) Static Sitting - Level of Assistance: 5: Stand by assistance   End of Session OT - End of Session Activity Tolerance: Patient tolerated treatment well Patient left: in chair;with call bell/phone within reach;Other (comment) (spoke with RN Elon Jester about transfer) Nurse Communication: Mobility status;Precautions;Weight bearing status  GO     Lucile Shutters 11/10/2011, 11:37 AM Pager: [phone]

## 2011-11-10 NOTE — Progress Notes (Signed)
Subjective: 1 Day Post-Op Procedure(s) (LRB): EXTERNAL FIXATION LEG (Left) Patient reports pain as moderate.  Controlled with oral pain meds.  Objective: Vital signs in last 24 hours: Temp:  [98.1 F (36.7 C)-99.6 F (37.6 C)] 99.1 F (37.3 C) (09/24 1343) Pulse Rate:  [86-99] 86  (09/24 1343) Resp:  [14-22] 18  (09/24 1343) BP: (106-162)/(54-92) 146/73 mmHg (09/24 1300) SpO2:  [91 %-99 %] 91 % (09/24 0537)  Intake/Output from previous day: 09/23 0701 - 09/24 0700 In: 2595.4 [P.O.:480; I.V.:2060.4; IV Piggyback:55] Out: 1100 [Urine:1100] Intake/Output this shift: Total I/O In: -  Out: 250 [Urine:250]   Basename 11/09/11 0041  HGB 13.8    Basename 11/09/11 0041  WBC 12.5*  RBC 4.53  HCT 38.8*  PLT 292    Basename 11/09/11 0041  NA 135  K 3.5  CL 102  CO2 19  BUN 16  CREATININE 1.01  GLUCOSE 136*  CALCIUM 9.5    Basename 11/09/11 0259  LABPT --  INR 1.18    R LE splinted.  L LE with ex fix on.  Toes bilat with brisk cap refill.  Dimiinished sens to LT in medial plantar nerve distribution.  Assessment/Plan: 1 Day Post-Op Procedure(s) (LRB): EXTERNAL FIXATION LEG (Left) Likely to the OR Thursday afernoon for ORIF of the right calcaneus.  Toni Arthurs 11/10/2011, 3:31 PM

## 2011-11-10 NOTE — Progress Notes (Signed)
Clinical Social Work Department BRIEF PSYCHOSOCIAL ASSESSMENT 11/10/2011  Patient:  Larry Randall, Larry Randall     Account Number:  [id number]     Admit date:  11/08/2011  Clinical Social Worker:  Dennison Bulla  Date/Time:  11/10/2011 04:00 PM  Referred by:  Physician  Date Referred:  11/10/2011 Referred for  SNF Placement   Other Referral:   Interview type:  Patient Other interview type:   Dtr present    PSYCHOSOCIAL DATA Living Status:  ALONE Admitted from facility:   Level of care:   Primary support name:  Larry Randall Primary support relationship to patient:  CHILD, ADULT Degree of support available:   Strong    CURRENT CONCERNS Current Concerns  Post-Acute Placement   Other Concerns:    SOCIAL WORK ASSESSMENT / PLAN CSW received referral for SNF placement. Per chart review, PT recommends HH with 24 hour supervision. CSW met with patient and dtr at bedside. Patient agreeable to dtr being involved in assessment.    CSW introduced myself and explained role. Patient reports that PTA to admission he was independent but reports that he will stay with dtr and son-in-law at dc. Patient interested in [facility]. CM made aware of referral.    CSW is signing off but available if needed.   Assessment/plan status:  No Further Intervention Required Other assessment/ plan:   Information/referral to community resources:   Referred to CM for [facility]    PATIENT'S/FAMILY'S RESPONSE TO PLAN OF CARE: Patient alert and oriented. Patient and dtr engaged throughout assessment and both agree to [facility] at dc.

## 2011-11-10 NOTE — Evaluation (Signed)
Physical Therapy Evaluation Patient Details Name: Larry Randall MRN: [id number] DOB: October 10, 1950 Today's Date: 11/10/2011 Time: 4098-1191 PT Time Calculation (min): 22 min  PT Assessment / Plan / Recommendation Clinical Impression  Pt is 61 y/o male admitted after fall from ladder with bilateral LE fractures with Left LE external fixators.  Pt will benefit from acute PT services to improve overall mobility and prepare for safe d/c home with daughter.  Pt will need HHPT/OT, w/c with elevated leg rest and cushion.  Will continue to follow to perform lateral transfers.    PT Assessment  Patient needs continued PT services    Follow Up Recommendations  Home health PT;Supervision/Assistance - 24 hour    Barriers to Discharge None      Equipment Recommendations  Rolling walker with 5" wheels;Other (comment);Wheelchair (measurements);Wheelchair cushion (measurements) (drop arm 3n1, elevated leg rest due to NWB BIL LE)    Recommendations for Other Services  (none)   Frequency Min 5X/week    Precautions / Restrictions Precautions Precautions: Fall;Other (comment) (BIL LE NWB) Restrictions Weight Bearing Restrictions: Yes RLE Weight Bearing: Non weight bearing LLE Weight Bearing: Non weight bearing   Pertinent Vitals/Pain 3/10 bilateral LE pain      Mobility  Bed Mobility Bed Mobility: Supine to Sit Supine to Sit: 4: Min assist;HOB flat Sitting - Scoot to Edge of Bed: 1: +2 Total assist Sitting - Scoot to Edge of Bed: Patient Percentage: 80% Details for Bed Mobility Assistance: Pt completed anterior posterior from supine position to chair. pt using BIL UE to (A) with mobility. Pt required (A) for BIL LE due to external fix on Lt and Rt splint.  Transfers Transfers: Risk manager: 1: +2 Total assist;To level surface Anterior-Posterior Transfers: Patient Percentage: 80% Details for Transfer Assistance: +2 (A) for safety with assistance  with left LE and max cues for proper hip, LE and hand placement.  Pt able to use UE to complete transfer.  Plan to attempt lateral transfer next session. Ambulation/Gait Ambulation/Gait Assistance: Not tested (comment)    Exercises     PT Diagnosis: Acute pain;Generalized weakness  PT Problem List: Decreased mobility;Decreased balance;Decreased knowledge of use of DME;Pain PT Treatment Interventions: Functional mobility training;Therapeutic activities;Balance training;Patient/family education   PT Goals Acute Rehab PT Goals PT Goal Formulation: With patient Time For Goal Achievement: 11/17/11 Potential to Achieve Goals: Good Pt will go Supine/Side to Sit: with supervision PT Goal: Supine/Side to Sit - Progress: Goal set today Pt will Sit at Edge of Bed: with supervision;1-2 min PT Goal: Sit at Edge Of Bed - Progress: Goal set today Pt will go Sit to Supine/Side: with supervision PT Goal: Sit to Supine/Side - Progress: Goal set today Pt will Transfer Bed to Chair/Chair to Bed: with supervision PT Transfer Goal: Bed to Chair/Chair to Bed - Progress: Goal set today Pt will Perform Home Exercise Program: Independently PT Goal: Perform Home Exercise Program - Progress: Goal set today  Visit Information  Last PT Received On: 11/10/11 Assistance Needed: +2 PT/OT Co-Evaluation/Treatment: Yes    Subjective Data  Subjective: "I haven't slept good since the accident." Patient Stated Goal: To go home   Prior Functioning  Home Living Lives With: Alone Available Help at Discharge: Family (plans to stay at daughter's house; daughter has 2 y/o)) Type of Home: House Home Access: Stairs to enter Secretary/administrator of Steps: 5 Home Layout: Two level Bathroom Shower/Tub:  (known at this time) Firefighter: Standard Prior Function Level of Independence: Independent Able  to Take Stairs?: Yes Driving: Yes Vocation: Full time employment Communication Communication: No  difficulties Dominant Hand: Right    Cognition  Overall Cognitive Status: Appears within functional limits for tasks assessed/performed Arousal/Alertness: Awake/alert Orientation Level: Appears intact for tasks assessed Behavior During Session: [facility] for tasks performed    Extremity/Trunk Assessment Right Upper Extremity Assessment RUE ROM/Strength/Tone: Within functional levels Left Upper Extremity Assessment LUE ROM/Strength/Tone: Within functional levels Right Lower Extremity Assessment RLE ROM/Strength/Tone: Deficits;Unable to fully assess;Due to pain RLE ROM/Strength/Tone Deficits: Pt atleast 2+/5 gross with quad Left Lower Extremity Assessment LLE ROM/Strength/Tone: Deficits;Unable to fully assess;Due to pain LLE ROM/Strength/Tone Deficits: At least 2+/5 quad. Trunk Assessment Trunk Assessment: Normal   Balance Balance Balance Assessed: Yes Static Sitting Balance Static Sitting - Balance Support: Bilateral upper extremity supported (long sitting in bed) Static Sitting - Level of Assistance: 5: Stand by assistance  End of Session PT - End of Session Equipment Utilized During Treatment:  (external fixator Left LE) Activity Tolerance: Patient limited by pain Patient left: in chair;with call bell/phone within reach Nurse Communication: Mobility status;Precautions  GP     Larry Randall 11/10/2011, 11:46 AM [name], PT DPT [phone]

## 2011-11-11 ENCOUNTER — Encounter (HOSPITAL_COMMUNITY): Payer: Self-pay | Admitting: Orthopedic Surgery

## 2011-11-11 MED ORDER — CEFAZOLIN SODIUM-DEXTROSE 2-3 GM-% IV SOLR
2.0000 g | INTRAVENOUS | Status: AC
  Administered 2011-11-12 (×2): 2 g via INTRAVENOUS
  Filled 2011-11-11: qty 50

## 2011-11-11 MED ORDER — CHLORHEXIDINE GLUCONATE 4 % EX LIQD
60.0000 mL | Freq: Once | CUTANEOUS | Status: DC
  Filled 2011-11-11: qty 60

## 2011-11-11 NOTE — Progress Notes (Signed)
Subjective: POD #2 s/p L tibial pilon ex fix.  Pt also has nondisplaced L calc fx and displcaed comminuted r calc fx.  He says pain is improving.  No n/v/f/c.  Doing well with PT.    Objective: Vital signs in last 24 hours: Temp:  [99.6 F (37.6 C)] 99.6 F (37.6 C) (09/24 2213) Pulse Rate:  [100] 100  (09/24 2213) Resp:  [18] 18  (09/24 2213) BP: (113)/(60) 113/60 mmHg (09/24 2213) SpO2:  [90 %-93 %] 90 % (09/25 0725)  Intake/Output from previous day: 09/24 0701 - 09/25 0700 In: 960 [P.O.:960] Out: 1000 [Urine:1000] Intake/Output this shift:     Basename 11/09/11 0041  HGB 13.8    Basename 11/09/11 0041  WBC 12.5*  RBC 4.53  HCT 38.8*  PLT 292    Basename 11/09/11 0041  NA 135  K 3.5  CL 102  CO2 19  BUN 16  CREATININE 1.01  GLUCOSE 136*  CALCIUM 9.5    Basename 11/09/11 0259  LABPT --  INR 1.18    L LE ex fix in place.  No signs of infection.  R LE splinted.  Decreased sens to LT in medial and lateral plantar nerve distribution.  Splint in place.  The L heel has no gross deformity.  Sens to LT in tact in medial and lat planar nerve dist.  5/5 strength in PF / DF at toes.  CT scan reviewed - R calc fx is quite comminuted and displaced.  L calc fx has some comminution but is aligned acceptably.  L pilon fx is quite severe.  Assessment/Plan: R calc fx - to OR tomorrow for ORiF.  NPO after midnight.  D/c blood thinners.  The risks and benefits of the alternative treatment options have been discussed in detail.  The patient wishes to proceed with surgery and specifically understands risks of bleeding, infection, nerve damage, blood clots, need for additional surgery, amputation and death.  L tibial pilon fx - to OR hopefully next week if the skin looks ok.  I'll check the skin tomorrow in the OR and change the dressings then.  L calcaneus fracture - I believe this can be treated in closed fashion given it's stability and acceptable alignment.  This is a separate  diagnosis in the post op period for operative treatment of the left tibial pilon fracture.  Toni Arthurs 11/11/2011, 5:44 PM

## 2011-11-11 NOTE — Progress Notes (Signed)
Agree with PT treatment note.  Anjelita Sheahan, PT DPT [phone]  

## 2011-11-11 NOTE — Progress Notes (Signed)
Occupational Therapy Treatment Patient Details Name: Larry Randall MRN: [id number] DOB: 1951/02/01 Today's Date: 11/11/2011 Time: 4098-1191 OT Time Calculation (min): 23 min  OT Assessment / Plan / Recommendation Comments on Treatment Session Pt with Lt foot drop foot splint fabricated this session. RN made aware and educated on monitoring for signs and symptoms of  skin breakdown    Follow Up Recommendations  Home health OT    Barriers to Discharge       Equipment Recommendations  Wheelchair (measurements);Wheelchair cushion (measurements);Hospital bed;Other (comment)    Recommendations for Other Services    Frequency Min 2X/week   Plan Discharge plan remains appropriate    Precautions / Restrictions Precautions Precautions: Fall (B LE NWB) Precaution Comments: Splint on Lt Foot to prevent contracture and foot drop Required Braces or Orthoses:  (splint Lt foot) Restrictions Weight Bearing Restrictions: Yes RLE Weight Bearing: Non weight bearing LLE Weight Bearing: Non weight bearing   Pertinent Vitals/Pain  Pt requesting pain medication. RN notified. IV team RN in room attempting to place IV. RN with pain medication ready to give IV medication    ADL  ADL Comments: OT fabricated Lt LE foot drop splint. Pt tolerating well and educated on fit. Pt monitored and tolerating splint well. pt and daughter educated on the purpose of the splint.    OT Diagnosis:    OT Problem List:   OT Treatment Interventions:     OT Goals Acute Rehab OT Goals OT Goal Formulation: With patient Time For Goal Achievement: 11/24/11 Potential to Achieve Goals: Good Miscellaneous OT Goals Miscellaneous OT Goal #1: Pt will tolerate Lt foot drop splint without symptoms or signs of skin break down. OT Goal: Miscellaneous Goal #1 - Progress: Goal set today  Visit Information  Last OT Received On: 11/11/11 Assistance Needed: +2    Subjective Data      Prior Functioning       Cognition  Overall Cognitive Status: Appears within functional limits for tasks assessed/performed Arousal/Alertness: Awake/alert Orientation Level: Appears intact for tasks assessed Behavior During Session: [facility] for tasks performed    Mobility  Shoulder Instructions Bed Mobility Bed Mobility: Supine to Sit Supine to Sit: 4: Min guard Details for Bed Mobility Assistance: Cues for safety and proper technique Transfers Details for Transfer Assistance: +2 (A) for safety and supporting bilateral LE to maintain NWB status.  Cues for proper technique, hand placement, and safety. Pt able to use UE support to transfer and next session will try with sliding board.       Exercises  General Exercises - Lower Extremity Quad Sets: AROM;Both;10 reps   Balance     End of Session OT - End of Session Activity Tolerance: Patient tolerated treatment well Patient left: in chair;with call bell/phone within reach;Other (comment) Nurse Communication: Mobility status;Precautions;Weight bearing status  GO     Harrel Carina [facility] 11/11/2011, 2:59 PM Pager: [phone]

## 2011-11-11 NOTE — Progress Notes (Signed)
Physical Therapy Progress Note   11/11/11 1400  PT Visit Information  Last PT Received On 11/11/11  Assistance Needed +2  PT Time Calculation  PT Start Time 1404  PT Stop Time 1427  PT Time Calculation (min) 23 min  Subjective Data  Subjective Doing okay  Patient Stated Goal To go home  Precautions  Precautions Fall (B LE NWB)  Restrictions  Weight Bearing Restrictions Yes  RLE Weight Bearing NWB  LLE Weight Bearing NWB  Cognition  Overall Cognitive Status Appears within functional limits for tasks assessed/performed  Arousal/Alertness Awake/alert  Orientation Level Appears intact for tasks assessed  Behavior During Session [facility] for tasks performed  Bed Mobility  Bed Mobility Supine to Sit  Supine to Sit 4: Min guard  Details for Bed Mobility Assistance Cues for safety and proper technique  Transfers  Transfers Lateral/Scoot Transfers  Lateral/Scoot Transfers 1: +2 Total assist  Lateral Transfers: Patient Percentage 80%  Details for Transfer Assistance +2 (A) for safety and supporting bilateral LE to maintain NWB status.  Cues for proper technique, hand placement, and safety. Pt able to use UE support to transfer and next session will try with sliding board.  Ambulation/Gait  Ambulation/Gait Assistance Not tested (comment)  Exercises  Exercises General Lower Extremity  General Exercises - Lower Extremity  Quad Sets AROM;Both;10 reps  PT - End of Session  Activity Tolerance Patient tolerated treatment well  Patient left in chair;with call bell/phone within reach;with family/visitor present  Nurse Communication Mobility status;Precautions  PT - Assessment/Plan  Comments on Treatment Session Pt able to lower bilateral LE during transfer.  Pt used UE strength and support to manuever during transfer.  Family and pt educated on next few days and equipment recommendations were discussed.  PT Plan Discharge plan remains appropriate;Frequency remains appropriate  PT Frequency Min  5X/week  Recommendations for Other Services Other (comment) (None)  Follow Up Recommendations Home health PT;Supervision/Assistance - 24 hour  Equipment Recommended Wheelchair (measurements);Wheelchair cushion (measurements);Hospital bed;Other (comment) (hospital bed with trapeze bar)  Acute Rehab PT Goals  PT Goal Formulation With patient  Time For Goal Achievement 11/17/11  Potential to Achieve Goals Good  Pt will go Supine/Side to Sit with supervision  PT Goal: Supine/Side to Sit - Progress Progressing toward goal  Pt will Transfer Bed to Chair/Chair to Bed with supervision  PT Transfer Goal: Bed to Chair/Chair to Bed - Progress Progressing toward goal  Pt will Perform Home Exercise Program Independently  PT Goal: Perform Home Exercise Program - Progress Progressing toward goal    Pt reported pain in bilateral LE but did not rate.  Lacoya Wilbanks, SPT

## 2011-11-12 ENCOUNTER — Encounter (HOSPITAL_COMMUNITY): Payer: Self-pay | Admitting: Anesthesiology

## 2011-11-12 ENCOUNTER — Inpatient Hospital Stay (HOSPITAL_COMMUNITY): Payer: Worker's Compensation | Admitting: Anesthesiology

## 2011-11-12 ENCOUNTER — Inpatient Hospital Stay (HOSPITAL_COMMUNITY): Payer: Worker's Compensation

## 2011-11-12 ENCOUNTER — Encounter (HOSPITAL_COMMUNITY)
Admission: EM | Disposition: A | Discharge status: Home Health | Payer: Self-pay | Source: Home / Self Care | Attending: Orthopedic Surgery

## 2011-11-12 HISTORY — PX: ORIF CALCANEOUS FRACTURE: SHX5030

## 2011-11-12 SURGERY — OPEN REDUCTION INTERNAL FIXATION (ORIF) CALCANEOUS FRACTURE
Anesthesia: General | Site: Foot | Laterality: Right | Wound class: Clean

## 2011-11-12 MED ORDER — HYDROMORPHONE HCL PF 1 MG/ML IJ SOLN
0.2500 mg | INTRAMUSCULAR | Status: DC | PRN
  Administered 2011-11-17 (×2): 0.5 mg via INTRAVENOUS

## 2011-11-12 MED ORDER — LIDOCAINE HCL (CARDIAC) 20 MG/ML IV SOLN
INTRAVENOUS | Status: DC | PRN
  Administered 2011-11-12: 100 mg via INTRAVENOUS

## 2011-11-12 MED ORDER — ROCURONIUM BROMIDE 100 MG/10ML IV SOLN
INTRAVENOUS | Status: DC | PRN
  Administered 2011-11-12: 50 mg via INTRAVENOUS

## 2011-11-12 MED ORDER — ONDANSETRON HCL 4 MG/2ML IJ SOLN
INTRAMUSCULAR | Status: DC | PRN
  Administered 2011-11-12: 4 mg via INTRAVENOUS

## 2011-11-12 MED ORDER — DEXTROSE 5 % IV SOLN
INTRAVENOUS | Status: DC | PRN
  Administered 2011-11-12 (×2): via INTRAVENOUS

## 2011-11-12 MED ORDER — BACITRACIN ZINC 500 UNIT/GM EX OINT
TOPICAL_OINTMENT | CUTANEOUS | Status: AC
  Filled 2011-11-12: qty 15

## 2011-11-12 MED ORDER — PROPOFOL 10 MG/ML IV BOLUS
INTRAVENOUS | Status: DC | PRN
  Administered 2011-11-12: 130 mg via INTRAVENOUS

## 2011-11-12 MED ORDER — ACETAMINOPHEN 10 MG/ML IV SOLN
INTRAVENOUS | Status: DC | PRN
  Administered 2011-11-12: 1000 mg via INTRAVENOUS

## 2011-11-12 MED ORDER — GLYCOPYRROLATE 0.2 MG/ML IJ SOLN
INTRAMUSCULAR | Status: DC | PRN
  Administered 2011-11-12: 0.4 mg via INTRAVENOUS

## 2011-11-12 MED ORDER — ENOXAPARIN SODIUM 40 MG/0.4ML ~~LOC~~ SOLN
40.0000 mg | SUBCUTANEOUS | Status: DC
  Administered 2011-11-13 – 2011-11-19 (×6): 40 mg via SUBCUTANEOUS
  Filled 2011-11-12 (×9): qty 0.4

## 2011-11-12 MED ORDER — HYDROMORPHONE HCL PF 1 MG/ML IJ SOLN
INTRAMUSCULAR | Status: DC | PRN
  Administered 2011-11-12 (×3): .25 mg via INTRAVENOUS

## 2011-11-12 MED ORDER — LACTATED RINGERS IV SOLN
INTRAVENOUS | Status: DC | PRN
  Administered 2011-11-12 (×2): via INTRAVENOUS

## 2011-11-12 MED ORDER — NEOSTIGMINE METHYLSULFATE 1 MG/ML IJ SOLN
INTRAMUSCULAR | Status: DC | PRN
  Administered 2011-11-12: 3 mg via INTRAVENOUS

## 2011-11-12 MED ORDER — LIDOCAINE-EPINEPHRINE (PF) 1.5 %-1:200000 IJ SOLN
INTRAMUSCULAR | Status: DC | PRN
  Administered 2011-11-12: 30 mL

## 2011-11-12 MED ORDER — FENTANYL CITRATE 0.05 MG/ML IJ SOLN
INTRAMUSCULAR | Status: DC | PRN
  Administered 2011-11-12 (×2): 100 ug via INTRAVENOUS
  Administered 2011-11-12: 50 ug via INTRAVENOUS

## 2011-11-12 MED ORDER — BUPIVACAINE-EPINEPHRINE PF 0.5-1:200000 % IJ SOLN
INTRAMUSCULAR | Status: DC | PRN
  Administered 2011-11-12: 30 mL

## 2011-11-12 MED ORDER — MIDAZOLAM HCL 5 MG/5ML IJ SOLN
INTRAMUSCULAR | Status: DC | PRN
  Administered 2011-11-12: 2 mg via INTRAVENOUS

## 2011-11-12 MED ORDER — BACITRACIN ZINC 500 UNIT/GM EX OINT
TOPICAL_OINTMENT | CUTANEOUS | Status: DC | PRN
  Administered 2011-11-12: 1 via TOPICAL

## 2011-11-12 MED ORDER — 0.9 % SODIUM CHLORIDE (POUR BTL) OPTIME
TOPICAL | Status: DC | PRN
  Administered 2011-11-12: 1000 mL

## 2011-11-12 MED ORDER — ONDANSETRON HCL 4 MG/2ML IJ SOLN: 4.0000 mg | Freq: Once | INTRAMUSCULAR | Status: AC | PRN

## 2011-11-12 SURGICAL SUPPLY — 67 items
BANDAGE ESMARK 6X9 LF (GAUZE/BANDAGES/DRESSINGS) ×1 IMPLANT
BANDAGE GAUZE ELAST BULKY 4 IN (GAUZE/BANDAGES/DRESSINGS) ×2 IMPLANT
BIT DRILL 2.8 QUICK RELEASE (BIT) ×1 IMPLANT
BIT DRILL QUICK RELEASE 3.5MM (BIT) ×1 IMPLANT
BLADE SURG 10 STRL SS (BLADE) ×2 IMPLANT
BNDG COHESIVE 4X5 TAN STRL (GAUZE/BANDAGES/DRESSINGS) ×4 IMPLANT
BNDG COHESIVE 6X5 TAN STRL LF (GAUZE/BANDAGES/DRESSINGS) ×2 IMPLANT
BNDG ESMARK 6X9 LF (GAUZE/BANDAGES/DRESSINGS) ×2
CANISTER SUCTION 2500CC (MISCELLANEOUS) ×2 IMPLANT
CHLORAPREP W/TINT 26ML (MISCELLANEOUS) ×2 IMPLANT
CLOTH BEACON ORANGE TIMEOUT ST (SAFETY) ×2 IMPLANT
COVER SURGICAL LIGHT HANDLE (MISCELLANEOUS) ×2 IMPLANT
CUFF TOURNIQUET SINGLE 34IN LL (TOURNIQUET CUFF) ×2 IMPLANT
CUFF TOURNIQUET SINGLE 44IN (TOURNIQUET CUFF) IMPLANT
DRAPE C-ARM 42X72 X-RAY (DRAPES) ×2 IMPLANT
DRAPE C-ARMOR (DRAPES) ×2 IMPLANT
DRAPE OEC MINIVIEW 54X84 (DRAPES) IMPLANT
DRAPE U-SHAPE 47X51 STRL (DRAPES) ×2 IMPLANT
DRILL 2.8 QUICK RELEASE (BIT) ×2
DRILL QUICK RELEASE 3.5MM (BIT) ×2
DRSG ADAPTIC 3X8 NADH LF (GAUZE/BANDAGES/DRESSINGS) IMPLANT
DRSG PAD ABDOMINAL 8X10 ST (GAUZE/BANDAGES/DRESSINGS) ×4 IMPLANT
ELECT REM PT RETURN 9FT ADLT (ELECTROSURGICAL) ×2
ELECTRODE REM PT RTRN 9FT ADLT (ELECTROSURGICAL) ×1 IMPLANT
GLOVE BIO SURGEON STRL SZ8 (GLOVE) ×2 IMPLANT
GLOVE BIOGEL PI IND STRL 7.5 (GLOVE) ×1 IMPLANT
GLOVE BIOGEL PI IND STRL 8 (GLOVE) ×1 IMPLANT
GLOVE BIOGEL PI INDICATOR 7.5 (GLOVE) ×1
GLOVE BIOGEL PI INDICATOR 8 (GLOVE) ×1
GOWN PREVENTION PLUS XLARGE (GOWN DISPOSABLE) ×2 IMPLANT
GOWN STRL NON-REIN LRG LVL3 (GOWN DISPOSABLE) ×4 IMPLANT
GUIDEWIRE ORTH 6X062XTROC NS (WIRE) ×3 IMPLANT
K-WIRE .062 (WIRE) ×3
KIT BASIN OR (CUSTOM PROCEDURE TRAY) ×2 IMPLANT
KIT ROOM TURNOVER OR (KITS) ×2 IMPLANT
MANIFOLD NEPTUNE II (INSTRUMENTS) IMPLANT
NEEDLE 22X1 1/2 (OR ONLY) (NEEDLE) IMPLANT
NS IRRIG 1000ML POUR BTL (IV SOLUTION) ×2 IMPLANT
PACK ORTHO EXTREMITY (CUSTOM PROCEDURE TRAY) ×2 IMPLANT
PAD ARMBOARD 7.5X6 YLW CONV (MISCELLANEOUS) ×4 IMPLANT
PAD CAST 4YDX4 CTTN HI CHSV (CAST SUPPLIES) ×1 IMPLANT
PADDING CAST COTTON 4X4 STRL (CAST SUPPLIES) ×1
SCREW HEX NON LOCK 3.5X34MM (Screw) ×4 IMPLANT
SCREW HEX NON LOCK 3.5X36MM (Screw) ×6 IMPLANT
SCREW LOCK 36X3.5X HEXALOBE (Screw) ×3 IMPLANT
SCREW LOCKING 3.5X10MM (Screw) ×2 IMPLANT
SCREW LOCKING 3.5X36 (Screw) ×3 IMPLANT
SCREW NON LOCK 3.5X40 (Screw) ×2 IMPLANT
SCREW SHANZ 5.0X175MM (Screw) ×2 IMPLANT
SOAP 2 % CHG 4 OZ (WOUND CARE) ×2 IMPLANT
SPONGE GAUZE 4X4 12PLY (GAUZE/BANDAGES/DRESSINGS) IMPLANT
SPONGE LAP 18X18 X RAY DECT (DISPOSABLE) ×2 IMPLANT
STOCKINETTE IMPERVIOUS 9X36 MD (GAUZE/BANDAGES/DRESSINGS) ×2 IMPLANT
SUCTION FRAZIER TIP 10 FR DISP (SUCTIONS) ×2 IMPLANT
SUT ETHILON 4 0 PS 2 18 (SUTURE) ×4 IMPLANT
SUT PROLENE 3 0 PS 2 (SUTURE) ×2 IMPLANT
SUT VIC AB 0 CT1 18XCR BRD 8 (SUTURE) ×2 IMPLANT
SUT VIC AB 0 CT1 8-18 (SUTURE) ×2
SUT VIC AB 2-0 CT1 27 (SUTURE) ×2
SUT VIC AB 2-0 CT1 TAPERPNT 27 (SUTURE) ×2 IMPLANT
SUT VIC AB 3-0 PS2 18 (SUTURE) ×1
SUT VIC AB 3-0 PS2 18XBRD (SUTURE) ×1 IMPLANT
SYR CONTROL 10ML LL (SYRINGE) IMPLANT
TOWEL OR 17X24 6PK STRL BLUE (TOWEL DISPOSABLE) ×2 IMPLANT
TOWEL OR 17X26 10 PK STRL BLUE (TOWEL DISPOSABLE) ×2 IMPLANT
TUBE CONNECTING 12X1/4 (SUCTIONS) ×2 IMPLANT
WATER STERILE IRR 1000ML POUR (IV SOLUTION) IMPLANT

## 2011-11-12 NOTE — Anesthesia Preprocedure Evaluation (Addendum)
Anesthesia Evaluation  Patient identified by MRN, date of birth, ID band Patient awake    Reviewed: Allergy & Precautions, H&P , NPO status , Patient's Chart, lab work & pertinent test results  History of Anesthesia Complications Negative for: history of anesthetic complications  Airway Mallampati: I TM Distance: >3 FB Neck ROM: Full    Dental  (+) Edentulous Upper, Edentulous Lower and Dental Advisory Given   Pulmonary former smoker,  breath sounds clear to auscultation        Cardiovascular + CAD and + Past MI Rhythm:Regular Rate:Normal  MI 1997; 3 bare metal stents   Neuro/Psych    GI/Hepatic hiatal hernia, GERD-  Medicated and Controlled,  Endo/Other    Renal/GU      Musculoskeletal   Abdominal   Peds  Hematology   Anesthesia Other Findings   Reproductive/Obstetrics                          Anesthesia Physical Anesthesia Plan  ASA: III  Anesthesia Plan: General   Post-op Pain Management: MAC Combined w/ Regional for Post-op pain   Induction: Intravenous  Airway Management Planned: Oral ETT  Additional Equipment:   Intra-op Plan:   Post-operative Plan: Extubation in OR  Informed Consent: I have reviewed the patients History and Physical, chart, labs and discussed the procedure including the risks, benefits and alternatives for the proposed anesthesia with the patient or authorized representative who has indicated his/her understanding and acceptance.     Plan Discussed with: CRNA and Surgeon  Anesthesia Plan Comments:         Anesthesia Quick Evaluation

## 2011-11-12 NOTE — Preoperative (Signed)
Beta Blockers   Reason not to administer Beta Blockers:Not Applicable 

## 2011-11-12 NOTE — Progress Notes (Signed)
Consent and permission to be given blood forms signed by patient.

## 2011-11-12 NOTE — Brief Op Note (Signed)
11/08/2011 - 11/12/2011  6:01 PM  PATIENT:  Larry Randall  61 y.o. male  PRE-OPERATIVE DIAGNOSIS:  RIGHT CALCANEOUS FRACTURE  POST-OPERATIVE DIAGNOSIS:  RIGHT CALCANEOUS FRACTURE  Procedure(s): 1.  OPEN REDUCTION INTERNAL FIXATION (ORIF) RIGHT CALCANEOUS FRACTURE 2.  Fluoro > 1 hour  SURGEON:  Toni Arthurs, MD  ASSISTANT: n/a  ANESTHESIA:   General, regional  EBL:  minimal   TOURNIQUET:  ~1:40 at 225 mm Hg  COMPLICATIONS:  None apparent  DISPOSITION:  Extubated, awake and stable to recovery.  DICTATION ID:  [id number]

## 2011-11-12 NOTE — Progress Notes (Addendum)
OT NOTE  OT checking ortho fit : pt to found to have foot plate on Lt LE around ankle. Pt reports the splint is sliding. Ot plans to add additional strap to help secure placement of splint. Pt with surg Thursday at 2pm for Rt LE and evaluation of LT Le. OT will hold further fitting until s/p surg. Ot to check on patient in the AM of Friday   1150-1158am Lucile Shutters   OTR/L Pager: [phone] Office: [phone] .

## 2011-11-12 NOTE — Transfer of Care (Signed)
Immediate Anesthesia Transfer of Care Note  Patient: Larry Randall  Procedure(s) Performed: Procedure(s) (LRB) with comments: OPEN REDUCTION INTERNAL FIXATION (ORIF) CALCANEOUS FRACTURE (Right) - ORIF RIGHT CALCANEOUS FRACTURE  Patient Location: PACU  Anesthesia Type: General  Level of Consciousness: awake, alert , oriented, sedated and patient cooperative  Airway & Oxygen Therapy: Patient Spontanous Breathing and Patient connected to nasal cannula oxygen  Post-op Assessment: Report given to PACU RN and Post -op Vital signs reviewed and stable  Post vital signs: Reviewed and stable  Complications: No apparent anesthesia complications

## 2011-11-12 NOTE — Progress Notes (Signed)
PT Cancellation Note  Treatment cancelled today due to patient receiving procedure or test.  Pt going off floor to OR today.  Will hold PT session today.  Will continue to follow.   Lilac Hoff 11/12/2011, 11:55 AM [name], PT DPT [phone]

## 2011-11-12 NOTE — Anesthesia Procedure Notes (Addendum)
Anesthesia Regional Block:  Popliteal block  Pre-Anesthetic Checklist: ,, timeout performed, Correct Patient, Correct Site, Correct Laterality, Correct Procedure, Correct Position, site marked, Risks and benefits discussed,  Surgical consent,  Pre-op evaluation,  At surgeon's request and post-op pain management  Laterality: Right  Prep: chloraprep       Needles:  Injection technique: Single-shot  Needle Type: Echogenic Stimulator Needle          Additional Needles:  Procedures: ultrasound guided and nerve stimulator Popliteal block  Nerve Stimulator or Paresthesia:  Response: 0.4 mA,   Additional Responses:   Narrative:  Start time: 11/12/2011 3:10 PM End time: 11/12/2011 3:30 PM Injection made incrementally with aspirations every 5 mL.  Performed by: Personally  Anesthesiologist: Arta Bruce MD  Additional Notes: Monitors applied. Patient sedated. Sterile prep and drape,hand hygiene and sterile gloves were used. Relevant anatomy identified.Needle position confirmed.Local anesthetic injected incrementally after negative aspiration. Local anesthetic spread visualized around nerve(s). Vascular puncture avoided. No complications. Image printed for medical record.The patient tolerated the procedure well.  Additional Saphenous nerve block performed. 15cc Local Anesthetic mixture placed under ultrasonic guidance along the medio-inferior border of the Sartorious muscle 6 inches above the knee.  No Problems encountered.  Arta Bruce MD   Popliteal block Procedure Name: Intubation Date/Time: 11/12/2011 3:39 PM Performed by: Lovie Chol Pre-anesthesia Checklist: Patient identified, Emergency Drugs available, Suction available, Patient being monitored and Timeout performed Patient Re-evaluated:Patient Re-evaluated prior to inductionOxygen Delivery Method: Circle system utilized Preoxygenation: Pre-oxygenation with 100% oxygen Intubation Type: IV induction Ventilation: Mask  ventilation without difficulty Laryngoscope Size: Miller and 3 Grade View: Grade I Tube type: Oral Tube size: 7.5 mm Number of attempts: 1 Airway Equipment and Method: Stylet Placement Confirmation: ETT inserted through vocal cords under direct vision,  positive ETCO2 and breath sounds checked- equal and bilateral Secured at: 21 cm Tube secured with: Tape Dental Injury: Teeth and Oropharynx as per pre-operative assessment

## 2011-11-12 NOTE — Interval H&P Note (Signed)
History and Physical Interval Note:  11/12/2011 7:30 AM  [name]  has presented today for surgery, with the diagnosis of RIGHT CALCANEOUS FRACTURE  The various methods of treatment have been discussed with the patient and family. After consideration of risks, benefits and other options for treatment, the patient has consented to  Procedure(s) (LRB) with comments: OPEN REDUCTION INTERNAL FIXATION (ORIF) CALCANEOUS FRACTURE (Right) - ORIF RIGHT CALCANEOUS FRACTURE as a surgical intervention .  The patient's history has been reviewed, patient examined, no change in status, stable for surgery.  I have reviewed the patient's chart and labs.  Questions were answered to the patient's satisfaction.     Toni Arthurs

## 2011-11-12 NOTE — Anesthesia Postprocedure Evaluation (Signed)
  Anesthesia Post-op Note  Patient: Larry Randall  Procedure(s) Performed: Procedure(s) (LRB) with comments: OPEN REDUCTION INTERNAL FIXATION (ORIF) CALCANEOUS FRACTURE (Right) - ORIF RIGHT CALCANEOUS FRACTURE  Patient Location: PACU  Anesthesia Type: General and GA combined with regional for post-op pain  Level of Consciousness: awake, alert  and oriented  Airway and Oxygen Therapy: Patient Spontanous Breathing and Patient connected to nasal cannula oxygen  Post-op Pain: none  Post-op Assessment: Post-op Vital signs reviewed and Patient's Cardiovascular Status Stable  Post-op Vital Signs: stable  Complications: No apparent anesthesia complications

## 2011-11-13 MED ORDER — ZOLPIDEM TARTRATE 5 MG PO TABS
5.0000 mg | ORAL_TABLET | Freq: Every evening | ORAL | Status: DC | PRN
  Administered 2011-11-18: 5 mg via ORAL
  Filled 2011-11-13: qty 1

## 2011-11-13 NOTE — Progress Notes (Signed)
Physical Therapy Treatment Patient Details Name: Larry Randall MRN: [id number] DOB: 07/25/1950 Today's Date: 11/13/2011 Time: 0147-0210 PT Time Calculation (min): 23 min  PT Assessment / Plan / Recommendation Comments on Treatment Session  Pt able to use bilateral UE strength to complete lateral transfer.  Family and pt educated about equipment and safety precautions.  Plan for next session is to transfer to [facility].    Follow Up Recommendations  Home health PT;Supervision/Assistance - 24 hour    Barriers to Discharge  NONE      Equipment Recommendations  Wheelchair (measurements);Wheelchair cushion (measurements);Hospital bed;Other (comment) Hospital bed with trapeze bar   Recommendations for Other Services Other (comment) (None)  Frequency Min 5X/week   Plan Discharge plan remains appropriate;Frequency remains appropriate    Precautions / Restrictions Precautions Precautions: Fall Restrictions Weight Bearing Restrictions: Yes RLE Weight Bearing: Non weight bearing LLE Weight Bearing: Non weight bearing   Pertinent Vitals/Pain Pain in bilateral LE did not rate.    Mobility  Bed Mobility Bed Mobility: Supine to Sit Supine to Sit: 4: Min guard Details for Bed Mobility Assistance: Cues for hand placement and proper technique Transfers Transfers: Lateral/Scoot Transfers Lateral/Scoot Transfers: 4: Min assist;With armrests removed Details for Transfer Assistance: (A) with supporting bilateral LE.  Cues for hand placement and proper technique. Ambulation/Gait Ambulation/Gait Assistance: Not tested (comment) Wheelchair Mobility Wheelchair Mobility: No    Exercises General Exercises - Lower Extremity Quad Sets: AROM;Both;10 reps Gluteal Sets: 10 reps Hip ABduction/ADduction: AAROM;Both;10 reps Straight Leg Raises: AROM;10 reps;Both   PT Diagnosis:    PT Problem List:   PT Treatment Interventions:     PT Goals Acute Rehab PT Goals PT Goal Formulation: With  patient Time For Goal Achievement: 11/17/11 Potential to Achieve Goals: Good Pt will go Supine/Side to Sit: with supervision PT Goal: Supine/Side to Sit - Progress: Progressing toward goal Pt will Transfer Bed to Chair/Chair to Bed: with supervision PT Transfer Goal: Bed to Chair/Chair to Bed - Progress: Progressing toward goal Pt will Perform Home Exercise Program: Independently PT Goal: Perform Home Exercise Program - Progress: Progressing toward goal  Visit Information  Last PT Received On: 11/13/11 Assistance Needed: +2    Subjective Data  Subjective: Doing alright. Patient Stated Goal: To go home   Cognition  Overall Cognitive Status: Appears within functional limits for tasks assessed/performed Arousal/Alertness: Awake/alert Orientation Level: Appears intact for tasks assessed Behavior During Session: [facility] for tasks performed    Balance     End of Session PT - End of Session Activity Tolerance: Patient tolerated treatment well Patient left: in chair;with call bell/phone within reach;with family/visitor present Nurse Communication: Weight bearing status   GP     DITOMMASO, AMY 11/13/2011, 2:20 PM [place], PT DPT [phone]

## 2011-11-13 NOTE — Progress Notes (Signed)
Subjective: 1 Day Post-Op Procedure(s) (LRB): OPEN REDUCTION INTERNAL FIXATION (ORIF) CALCANEOUS FRACTURE (Right) Patient reports pain as mild.  No pain at R LE.  L LE a little sore.  Objective: Vital signs in last 24 hours: Temp:  [96.8 F (36 C)-98.6 F (37 C)] 97.4 F (36.3 C) (09/27 0537) Pulse Rate:  [67-101] 67  (09/27 0537) Resp:  [15-27] 18  (09/27 0537) BP: (106-141)/(62-72) 120/64 mmHg (09/27 0537) SpO2:  [91 %-100 %] 98 % (09/27 0537)  Intake/Output from previous day: 09/26 0701 - 09/27 0700 In: 2745 [P.O.:20; I.V.:2725] Out: 510 [Urine:500; Blood:10] Intake/Output this shift:    No results found for this basename: HGB:5 in the last 72 hours No results found for this basename: WBC:2,RBC:2,HCT:2,PLT:2 in the last 72 hours No results found for this basename: NA:2,K:2,CL:2,CO2:2,BUN:2,CREATININE:2,GLUCOSE:2,CALCIUM:2 in the last 72 hours No results found for this basename: LABPT:2,INR:2 in the last 72 hours  R LE splinted.  Toes with brisk cap refill.  No motion or sens to LT.  Assessment/Plan: 1 Day Post-Op Procedure(s) (LRB): OPEN REDUCTION INTERNAL FIXATION (ORIF) CALCANEOUS FRACTURE (Right) Block still functioning.  Pt doing well post op.  We'll [plan for ORIF of L pilon on Tuesday when swelling resolves a bit more.  Continue PT and NWB  BLE.  Toni Arthurs 11/13/2011, 7:42 AM

## 2011-11-13 NOTE — Op Note (Signed)
Larry Randall, BARANOWSKI NO.:  [id number]  MEDICAL RECORD NO.:  [id number]  LOCATION:  [id number]                        FACILITY:  [facility]  PHYSICIAN:  Toni Arthurs, MD        DATE OF BIRTH:  01-21-1951  DATE OF PROCEDURE:  11/12/2011 DATE OF DISCHARGE:                              OPERATIVE REPORT   PREOPERATIVE DIAGNOSIS:  Right calcaneus fracture.  POSTOPERATIVE DIAGNOSIS:  Right calcaneus fracture.  PROCEDURE: 1. Open reduction and internal fixation of right calcaneus fracture. 2. Intraoperative interpretation of fluoroscopic imaging greater than     1 hour.  SURGEON:  Toni Arthurs, MD  ANESTHESIA:  General, regional.  ESTIMATED BLOOD LOSS:  Minimal.  TOURNIQUET TIME:  One hour and 40 minutes at 225 mmHg.  COMPLICATIONS:  None apparent.  DISPOSITION:  Extubated awake and stable to recovery.  INDICATIONS FOR PROCEDURE:  The patient is a 61 year old male who fractured his right calcaneus when he fell off a ladder in an injury at work approximately 4 days ago.  He presents now for operative treatment of this calcaneus fracture.  This is a separate diagnosis in the postoperative period for the treatment of his left tibial pilon fracture.  He understands the risks and benefits of the alternative treatment options and elects surgical treatment.  He specifically understands risks of bleeding, infection, nerve damage, blood clots, need for additional surgery, amputation, and death.  PROCEDURE IN DETAIL:  After preoperative consent was obtained and the correct operative site was identified, the patient was brought to the operating room and placed supine on the operating table.  General anesthesia was induced.  Preoperative antibiotics were administered. Surgical time-out was taken.  Right lower extremity was prepped and draped in standard sterile fashion with a tourniquet around the thigh. The extremity was exsanguinated and the tourniquet was inflated to  225 mmHg.  A curvilinear incision was marked over the sinus tarsi.  This incision was made.  Sharp dissection was carried down through the skin. Blunt dissection was carried down through subcutaneous tissue in the interval between the extensor digitorum brevis muscle and the peroneal tendon sheath was developed.  The lateral wall of the calcaneus was exposed along with the sinus tarsi.  The posterior facet was well- visualized.  The fracture was reduced and provisionally pinned.  A 3.5 mm lag screw was then inserted just below the subchondral bone.  The angle of Gissane was noted to be restored appropriately as was Boehler's angle.  A plate from the Acumed screw set was then selected and inserted through the incision along the lateral wall of the calcaneus.  A Schanz pin was inserted through a stab incision at the lateral aspect of the calcaneus.  It was used to position the hindfoot in appropriate alignment and a pin was inserted from the tuberosity of the sustentaculum to hold the reduction.  The Schanz pin was then removed. The plate was pinned into position provisionally and lateral fluoroscopic image was used to confirm the appropriate position.  A Harris heel view was also obtained showing appropriate alignment of the calcaneal tuberosity.  Illa Level views were obtained to confirm appropriate reduction of the subtalar joint, posterior facet.  The plate was then secured at its anterior segment with a nonlocking bicortical screw.  The locking screw was then placed just above this one fixing the plate securely to the anterior process.  The posterior aspect of the plate was then secured just below the subchondral bone with a nonlocking screw pulling the plate securely down to the lateral wall of the calcaneus. Stab incisions were then utilized to insert 3 more screws in the calcaneal tuberosity.  Final lateral foot Broden and Harris heel views were obtained.  These showed appropriate  reduction of the fracture and appropriate position and length of all hardware.  The wound was irrigated copiously.  A 2-0 Vicryl simple sutures were used to close the EDB fascia to the peroneal tendon sheath.  Inverted simple sutures of 3- 0 Monocryl were used to close the subcutaneous tissue.  The skin incision was closed with a running 3-0 Prolene suture.  Sterile dressings were applied followed by a well-padded short-leg splint. Tourniquet was released at approximately 1 hour and 40 minutes after application of the dressings.  The left lower extremity dressings were all removed and new dressings were placed around the external fixator pins as well as a well-padded compression wrap around the ankle.  The skin was noted to wrinkle appropriately and there was no evidence of blisters on the left lower extremity.  The patient was then awakened from anesthesia and transported to recovery room in stable condition.  FOLLOWUP PLAN:  The patient will be returned to [address] [place].  We will observe him over the weekend and plan to fix his left tibial pilon fracture early next week.     Toni Arthurs, MD     JH/MEDQ  D:  11/12/2011  T:  11/13/2011  Job:  [id number]

## 2011-11-13 NOTE — Progress Notes (Signed)
OT Note  Splint check: splint without signs of skin break down and tolerating splint don well. OT to check splint 11/14/11 Saturday.    Harrel Carina Clinton   OTR/L Pager: [phone] Office: [phone] .'

## 2011-11-13 NOTE — Progress Notes (Signed)
OT NOTE  OT splint Lt LE footplate adjusted and extra strap applied. Pt tolerating splint well at this time. Student RN notified of splint schedule for report and proper fit.  Splint should be removed Q4 for skin checks with medication passing by RN staff each shift to ensure no pressure wounds develop. The Lt footplate has two straps the top strap should be crossed for proper fit.    Larry Randall [place]   OTR/L Pager: [phone] Office: [phone] .

## 2011-11-13 NOTE — Progress Notes (Signed)
Verbal/Utilization review completed. Anette Guarneri, RN, BSN.

## 2011-11-14 MED ORDER — THYROID 60 MG PO TABS
60.0000 mg | ORAL_TABLET | Freq: Every day | ORAL | Status: DC
  Administered 2011-11-15 – 2011-11-19 (×5): 60 mg via ORAL
  Filled 2011-11-14 (×6): qty 1

## 2011-11-14 NOTE — Progress Notes (Signed)
Occupational Therapy Treatment Patient Details Name: Larry Randall MRN: [id number] DOB: 1950-09-28 Today's Date: 11/14/2011 Time: 7253-6644 OT Time Calculation (min): 8 min                     Precautions / Restrictions Restrictions Weight Bearing Restrictions: Yes RLE Weight Bearing: Non weight bearing LLE Weight Bearing: Non weight bearing       ADL  ADL Comments: Foot splint adjusted. It was sliding down foot. Tape applied to splint and ACE bandage to secure plate in place.  Discussed with RN        Visit Information  Last OT Received On: 11/14/11                         GO     Alba Cory 11/14/2011, 11:43 AM

## 2011-11-14 NOTE — Progress Notes (Signed)
Subjective: 2 Days Post-Op Procedure(s) (LRB): OPEN REDUCTION INTERNAL FIXATION (ORIF) CALCANEOUS FRACTURE (Right) Patient reports pain as moderate.  Reports he has not received his thyroid medicine since admission to hospital.   Objective: Vital signs in last 24 hours: Temp:  [97.4 F (36.3 C)-98.7 F (37.1 C)] 97.4 F (36.3 C) (09/28 0600) Pulse Rate:  [67-83] 67  (09/28 0600) Resp:  [16-18] 18  (09/28 0600) BP: (119-132)/(62-64) 119/62 mmHg (09/28 0600) SpO2:  [96 %-100 %] 100 % (09/28 0600)  Intake/Output from previous day: 09/27 0701 - 09/28 0700 In: 360 [P.O.:360] Out: 1000 [Urine:1000] Intake/Output this shift:    No results found for this basename: HGB:5 in the last 72 hours No results found for this basename: WBC:2,RBC:2,HCT:2,PLT:2 in the last 72 hours No results found for this basename: NA:2,K:2,CL:2,CO2:2,BUN:2,CREATININE:2,GLUCOSE:2,CALCIUM:2 in the last 72 hours No results found for this basename: LABPT:2,INR:2 in the last 72 hours  Bilateral lower extremities: Neurologically intact Compartment soft Dressings clean dry and intact Able to wiggle toes bilateral feet without pain  Assessment/Plan: 2 Days Post-Op Procedure(s) (LRB): OPEN REDUCTION INTERNAL FIXATION (ORIF) CALCANEOUS FRACTURE (Right) Continue current care  Elevate bilateral lower extremities above heart Start thyroid med once family brings to floor  Larry Randall, Larry Randall 11/14/2011, 12:19 PM

## 2011-11-15 NOTE — Progress Notes (Signed)
Subjective: 3 Days Post-Op Procedure(s) (LRB): OPEN REDUCTION INTERNAL FIXATION (ORIF) CALCANEOUS FRACTURE (Right) Patient reports pain as moderate.  Doing well no complaints  Objective: Vital signs in last 24 hours: Temp:  [98 F (36.7 C)-98.3 F (36.8 C)] 98 F (36.7 C) (09/29 0514) Pulse Rate:  [73-89] 89  (09/29 0514) Resp:  [16-18] 16  (09/29 0514) BP: (124-131)/(70-79) 126/79 mmHg (09/29 0514) SpO2:  [100 %] 100 % (09/29 0514)  Intake/Output from previous day: 09/28 0701 - 09/29 0700 In: 640 [P.O.:640] Out: 1250 [Urine:1250] Intake/Output this shift:    No results found for this basename: HGB:5 in the last 72 hours No results found for this basename: WBC:2,RBC:2,HCT:2,PLT:2 in the last 72 hours No results found for this basename: NA:2,K:2,CL:2,CO2:2,BUN:2,CREATININE:2,GLUCOSE:2,CALCIUM:2 in the last 72 hours No results found for this basename: LABPT:2,INR:2 in the last 72 hours  Bilateral lower extremities: Compartment soft Able to DF/PF toes Toes warm and dry Slight decrease bilateral feet to light touch Left leg pin tracts clean / benign   Assessment/Plan: 3 Days Post-Op Procedure(s) (LRB): OPEN REDUCTION INTERNAL FIXATION (ORIF) CALCANEOUS FRACTURE (Right)  Continue current care  Plan for patient to go to OR on Tuesday for ORIF left  pilon fracture.  Richardean Canal 11/15/2011, 12:34 PM

## 2011-11-16 ENCOUNTER — Encounter (HOSPITAL_COMMUNITY): Payer: Self-pay | Admitting: Orthopedic Surgery

## 2011-11-16 MED ORDER — SODIUM CHLORIDE 0.9 % IV SOLN
INTRAVENOUS | Status: DC
  Administered 2011-11-16: via INTRAVENOUS

## 2011-11-16 MED ORDER — SENNA 8.6 MG PO TABS
2.0000 | ORAL_TABLET | Freq: Two times a day (BID) | ORAL | Status: DC
  Administered 2011-11-16: 17.2 mg via ORAL
  Filled 2011-11-16 (×3): qty 2

## 2011-11-16 MED ORDER — CHLORHEXIDINE GLUCONATE 4 % EX LIQD
60.0000 mL | Freq: Once | CUTANEOUS | Status: AC
  Administered 2011-11-16: 4 via TOPICAL
  Filled 2011-11-16: qty 60

## 2011-11-16 MED ORDER — DEXTROSE 5 % IV SOLN
3.0000 g | INTRAVENOUS | Status: AC
  Administered 2011-11-17: 3 g via INTRAVENOUS
  Filled 2011-11-16: qty 3000

## 2011-11-16 NOTE — Progress Notes (Signed)
Subjective: 4 Days Post-Op Procedure(s) (LRB): OPEN REDUCTION INTERNAL FIXATION (ORIF) CALCANEOUS FRACTURE (Right) Patient reports pain as mild.  No BM in 3 days.  Objective: Vital signs in last 24 hours: Temp:  [98 F (36.7 C)-99 F (37.2 C)] 98 F (36.7 C) (09/30 0535) Pulse Rate:  [78-83] 83  (09/30 0535) Resp:  [16-18] 16  (09/30 0535) BP: (119-134)/(67-72) 129/69 mmHg (09/30 0535) SpO2:  [94 %-99 %] 94 % (09/30 0535)  Intake/Output from previous day: 09/29 0701 - 09/30 0700 In: 400 [P.O.:400] Out: 1400 [Urine:1400] Intake/Output this shift:    No results found for this basename: HGB:5 in the last 72 hours No results found for this basename: WBC:2,RBC:2,HCT:2,PLT:2 in the last 72 hours No results found for this basename: NA:2,K:2,CL:2,CO2:2,BUN:2,CREATININE:2,GLUCOSE:2,CALCIUM:2 in the last 72 hours No results found for this basename: LABPT:2,INR:2 in the last 72 hours  L LE ex fix in place.  NVI.  Assessment/Plan: 4 Days Post-Op Procedure(s) (LRB): OPEN REDUCTION INTERNAL FIXATION (ORIF) CALCANEOUS FRACTURE (Right) OR tomorrow for ORIF of L tibial pilon and removal of ex fix. BM today.  Toni Arthurs 11/16/2011, 7:54 AM

## 2011-11-16 NOTE — Progress Notes (Signed)
I agree with the following treatment note after reviewing documentation.   [place], Tyrihanna Wingert Brynn   OTR/L Pager: [phone] Office: [phone] .   

## 2011-11-16 NOTE — Progress Notes (Signed)
Occupational Therapy Treatment Patient Details Name: Larry Randall MRN: [id number] DOB: 1950/10/12 Today's Date: 11/16/2011 Time: 4098-1191 OT Time Calculation (min): 45 min  OT Assessment / Plan / Recommendation Comments on Treatment Session Pt. progressing well and is very pleasant and willing to participate in therapy session. Pt. Completed BADL's with set-up in chair.    Follow Up Recommendations  Home health OT    Barriers to Discharge       Equipment Recommendations  Wheelchair (measurements);Wheelchair cushion (measurements);Hospital bed;Other (comment)    Recommendations for Other Services    Frequency Min 2X/week   Plan Discharge plan remains appropriate    Precautions / Restrictions Precautions Precautions: Fall Precaution Comments: Splint on Lt Foot to prevent contracture and foot drop Restrictions Weight Bearing Restrictions: Yes RLE Weight Bearing: Non weight bearing LLE Weight Bearing: Non weight bearing   Pertinent Vitals/Pain None reported    ADL  Grooming: Performed;Wash/dry hands;Wash/dry face;Set up Where Assessed - Grooming: Supported sitting Upper Body Bathing: Performed;Chest;Right arm;Left arm;Abdomen;Set up Where Assessed - Upper Body Bathing: Supported sitting Lower Body Bathing: Performed;Set up Where Assessed - Lower Body Bathing: Lean right and/or left;Supported sitting Upper Body Dressing: Performed;Set up Where Assessed - Upper Body Dressing: Supported sitting Toilet Transfer: Performed;Supervision/safety Statistician Method: Clinical biochemist: Raised toilet seat with arms (or 3-in-1 over toilet) Toileting - Clothing Manipulation and Hygiene: Performed;Supervision/safety Where Assessed - Engineer, mining and Hygiene: Lean right and/or left Transfers/Ambulation Related to ADLs: pt completed anterior posterior transfer to bedside commode and lateral transfer to drop arm chair with supervision  for safety.  ADL Comments: Pt able to complete anterior posterior transfer for toileting on bedside commode with supervison. Foot splint adjusted due to sliding down foot. Pt. completed full sponge bath of UB and LB with setup (A).      OT Diagnosis:    OT Problem List:   OT Treatment Interventions:     OT Goals Acute Rehab OT Goals OT Goal Formulation: With patient Time For Goal Achievement: 11/24/11 Potential to Achieve Goals: Good ADL Goals Pt Will Perform Grooming: with set-up;Sitting, chair;Supported ADL Goal: Grooming - Progress: Met Pt Will Perform Upper Body Bathing: with set-up;Sitting, chair ADL Goal: Upper Body Bathing - Progress: Met Pt Will Perform Lower Body Bathing: with min assist;Sitting, chair;Sitting, edge of bed;with adaptive equipment ADL Goal: Lower Body Bathing - Progress: Progressing toward goals Pt Will Perform Upper Body Dressing: with set-up;Sitting, chair;Supported Pt Will Perform Lower Body Dressing: with min assist;Sitting, bed;with adaptive equipment Pt Will Transfer to Toilet: with min assist;Anterior-posterior transfer;3-in-1 ADL Goal: Statistician - Progress: Met Pt Will Perform Toileting - Hygiene: with min assist;Leaning right and/or left on 3-in-1/toilet ADL Goal: Toileting - Hygiene - Progress: Met Miscellaneous OT Goals Miscellaneous OT Goal #1: Pt will tolerate Lt foot drop splint without symptoms or signs of skin break down. OT Goal: Miscellaneous Goal #1 - Progress: Progressing toward goals  Visit Information  Last OT Received On: 11/16/11    Subjective Data      Prior Functioning       Cognition  Overall Cognitive Status: Appears within functional limits for tasks assessed/performed Arousal/Alertness: Awake/alert Orientation Level: Appears intact for tasks assessed Behavior During Session: [name] for tasks performed    Mobility   Bed Mobility Bed Mobility: Supine to Sit Supine to Sit: 5: Supervision Details for Bed Mobility  Assistance: Pt. requires (A) with supporting LLE due external fixator when performing anterior posterior transfer. Transfers Details for Transfer Assistance: Pt.  able to use UE strength to complete anterior posterior transfer to bedside commode and lateral transfer onto a drop arm chair. Requires (A) with supporting LLE due to external fixator.                  End of Session OT - End of Session Activity Tolerance: Patient tolerated treatment well Patient left: in chair;with call bell/phone within reach;with family/visitor present Nurse Communication: Mobility status;Weight bearing status  GO     Cleora Fleet 11/16/2011, 1:43 PM

## 2011-11-16 NOTE — Progress Notes (Signed)
Physical Therapy Treatment Patient Details Name: Larry Randall MRN: [id number] DOB: 1950-11-19 Today's Date: 11/16/2011 Time: 4742-5956 PT Time Calculation (min): 12 min  PT Assessment / Plan / Recommendation Comments on Treatment Session  Pt able to use bilateral UE strength to complete lateral transfer.  Family and pt educated about equipment and safety preacautions. No Wheelchair with elevating leg rest available for this session.   Plan for next session is to transfer to [facility].    Follow Up Recommendations  Home health PT;Supervision/Assistance - 24 hour    Barriers to Discharge        Equipment Recommendations  Wheelchair (measurements);Wheelchair cushion (measurements);Hospital bed;Other (comment) (Slide board, drop arm 3 in 1 elevated Leg rest.  )    Recommendations for Other Services Other (comment)  Frequency Min 5X/week   Plan Discharge plan remains appropriate;Frequency remains appropriate    Precautions / Restrictions Precautions Precautions: Fall Precaution Comments: Splint on Lt Foot to prevent contracture and foot drop Restrictions Weight Bearing Restrictions: Yes RLE Weight Bearing: Non weight bearing LLE Weight Bearing: Non weight bearing   Pertinent Vitals/Pain Pain 3/10 in LEs.  No intervention required.     Mobility  Bed Mobility Bed Mobility: Supine to Sit;Sit to Supine;Sitting - Scoot to Edge of Bed Supine to Sit: 5: Supervision Sitting - Scoot to Edge of Bed: 5: Supervision Details for Bed Mobility Assistance: Pt required no assistance.  Transfers Transfers: Lateral/Scoot Transfers Lateral/Scoot Transfers: 5: Supervision;6: Modified independent (Device/Increase time);With slide board Details for Transfer Assistance: Pt required no assistance to perform transfer.  Instructed pt to have family bring him a pair of shorts to aid in sliding across the board.   Ambulation/Gait Ambulation/Gait Assistance: Not tested (comment) Wheelchair  Mobility Wheelchair Mobility: No    Exercises     PT Diagnosis:    PT Problem List:   PT Treatment Interventions:     PT Goals Acute Rehab PT Goals PT Goal Formulation: With patient Time For Goal Achievement: 11/17/11 Potential to Achieve Goals: Good Pt will go Supine/Side to Sit: with supervision PT Goal: Supine/Side to Sit - Progress: Met Pt will Sit at Edge of Bed: with supervision;1-2 min PT Goal: Sit at Edge Of Bed - Progress: Met Pt will go Sit to Supine/Side: with supervision PT Goal: Sit to Supine/Side - Progress: Met Pt will Transfer Bed to Chair/Chair to Bed: with supervision PT Transfer Goal: Bed to Chair/Chair to Bed - Progress: Met Pt will Perform Home Exercise Program: Independently PT Goal: Perform Home Exercise Program - Progress: Progressing toward goal Pt will Propel Wheelchair: 51 - 150 feet;Independently PT Goal: Propel Wheelchair - Progress: Goal set today  Visit Information  Last PT Received On: 11/16/11    Subjective Data  Subjective: I have been getting into the chair from the bed by myself today.  Patient Stated Goal: To go home   Cognition  Overall Cognitive Status: Appears within functional limits for tasks assessed/performed Arousal/Alertness: Awake/alert Orientation Level: Appears intact for tasks assessed Behavior During Session: [facility] for tasks performed    Balance  Balance Balance Assessed: No  End of Session PT - End of Session Equipment Utilized During Treatment: Other (comment) (Slide board. ) Activity Tolerance: Patient tolerated treatment well Patient left: in chair;with call bell/phone within reach;with family/visitor present Nurse Communication: Weight bearing status   GP     Gay Moncivais 11/16/2011, 4:47 PM Gaylynn Seiple L. Makayia Duplessis DPT [phone]

## 2011-11-17 ENCOUNTER — Inpatient Hospital Stay (HOSPITAL_COMMUNITY): Payer: Worker's Compensation

## 2011-11-17 ENCOUNTER — Encounter (HOSPITAL_COMMUNITY): Payer: Self-pay | Admitting: Certified Registered"

## 2011-11-17 ENCOUNTER — Encounter (HOSPITAL_COMMUNITY)
Admission: EM | Disposition: A | Discharge status: Home Health | Payer: Self-pay | Source: Home / Self Care | Attending: Orthopedic Surgery

## 2011-11-17 ENCOUNTER — Inpatient Hospital Stay (HOSPITAL_COMMUNITY): Payer: Worker's Compensation | Admitting: Certified Registered"

## 2011-11-17 HISTORY — PX: ORIF TIBIA FRACTURE: SHX5416

## 2011-11-17 LAB — SURGICAL PCR SCREEN: Staphylococcus aureus: NEGATIVE

## 2011-11-17 SURGERY — OPEN REDUCTION INTERNAL FIXATION (ORIF) TIBIA FRACTURE
Anesthesia: General | Laterality: Left | Wound class: Clean

## 2011-11-17 MED ORDER — HYDROMORPHONE HCL PF 1 MG/ML IJ SOLN
INTRAMUSCULAR | Status: AC
  Administered 2011-11-17: 0.5 mg via INTRAVENOUS
  Filled 2011-11-17: qty 1

## 2011-11-17 MED ORDER — OXYCODONE HCL 5 MG/5ML PO SOLN: 5.0000 mg | Freq: Once | ORAL | Status: DC | PRN

## 2011-11-17 MED ORDER — WARFARIN VIDEO: 1.0000 | Freq: Once | Status: DC

## 2011-11-17 MED ORDER — HYDROMORPHONE HCL PF 1 MG/ML IJ SOLN
INTRAMUSCULAR | Status: DC | PRN
  Administered 2011-11-17 (×2): 0.5 mg via INTRAVENOUS

## 2011-11-17 MED ORDER — FENTANYL CITRATE 0.05 MG/ML IJ SOLN
INTRAMUSCULAR | Status: DC | PRN
  Administered 2011-11-17 (×3): 50 ug via INTRAVENOUS
  Administered 2011-11-17 (×2): 100 ug via INTRAVENOUS

## 2011-11-17 MED ORDER — LACTATED RINGERS IV SOLN
INTRAVENOUS | Status: DC | PRN
  Administered 2011-11-17: 07:00:00 via INTRAVENOUS

## 2011-11-17 MED ORDER — LIDOCAINE HCL (CARDIAC) 20 MG/ML IV SOLN
INTRAVENOUS | Status: DC | PRN
  Administered 2011-11-17: 100 mg via INTRAVENOUS

## 2011-11-17 MED ORDER — PROPOFOL 10 MG/ML IV BOLUS
INTRAVENOUS | Status: DC | PRN
  Administered 2011-11-17: 200 mg via INTRAVENOUS
  Administered 2011-11-17: 20 mg via INTRAVENOUS

## 2011-11-17 MED ORDER — BUPIVACAINE HCL (PF) 0.25 % IJ SOLN
INTRAMUSCULAR | Status: DC | PRN
  Administered 2011-11-17: 30 mL

## 2011-11-17 MED ORDER — BUPIVACAINE HCL (PF) 0.25 % IJ SOLN
INTRAMUSCULAR | Status: AC
  Filled 2011-11-17: qty 30

## 2011-11-17 MED ORDER — OXYCODONE HCL 5 MG PO TABS: 5.0000 mg | ORAL_TABLET | Freq: Once | ORAL | Status: DC | PRN

## 2011-11-17 MED ORDER — BACITRACIN ZINC 500 UNIT/GM EX OINT
TOPICAL_OINTMENT | CUTANEOUS | Status: AC
  Filled 2011-11-17: qty 15

## 2011-11-17 MED ORDER — WARFARIN SODIUM 7.5 MG PO TABS
7.5000 mg | ORAL_TABLET | Freq: Once | ORAL | Status: AC
  Administered 2011-11-17: 7.5 mg via ORAL
  Filled 2011-11-17: qty 1

## 2011-11-17 MED ORDER — ROCURONIUM BROMIDE 100 MG/10ML IV SOLN
INTRAVENOUS | Status: DC | PRN
  Administered 2011-11-17 (×2): 10 mg via INTRAVENOUS
  Administered 2011-11-17: 50 mg via INTRAVENOUS

## 2011-11-17 MED ORDER — HYDROMORPHONE HCL PF 1 MG/ML IJ SOLN
INTRAMUSCULAR | Status: AC
  Filled 2011-11-17: qty 1

## 2011-11-17 MED ORDER — GLYCOPYRROLATE 0.2 MG/ML IJ SOLN
INTRAMUSCULAR | Status: DC | PRN
  Administered 2011-11-17: 0.6 mg via INTRAVENOUS

## 2011-11-17 MED ORDER — HYDROMORPHONE HCL PF 1 MG/ML IJ SOLN
0.5000 mg | INTRAMUSCULAR | Status: DC | PRN
  Administered 2011-11-17 (×2): 0.5 mg via INTRAVENOUS

## 2011-11-17 MED ORDER — ONDANSETRON HCL 4 MG/2ML IJ SOLN: 4.0000 mg | Freq: Once | INTRAMUSCULAR | Status: DC | PRN

## 2011-11-17 MED ORDER — BACITRACIN ZINC 500 UNIT/GM EX OINT
TOPICAL_OINTMENT | CUTANEOUS | Status: DC | PRN
  Administered 2011-11-17: 1 via TOPICAL

## 2011-11-17 MED ORDER — NEOSTIGMINE METHYLSULFATE 1 MG/ML IJ SOLN
INTRAMUSCULAR | Status: DC | PRN
  Administered 2011-11-17: 4 mg via INTRAVENOUS

## 2011-11-17 MED ORDER — COUMADIN BOOK
1.0000 | Freq: Once | Status: DC
  Filled 2011-11-17: qty 1

## 2011-11-17 MED ORDER — WARFARIN - PHARMACIST DOSING INPATIENT
Freq: Every day | Status: DC
  Administered 2011-11-18: 7.5

## 2011-11-17 MED ORDER — OXYCODONE HCL 5 MG PO TABS
5.0000 mg | ORAL_TABLET | ORAL | Status: DC | PRN
  Administered 2011-11-17 – 2011-11-18 (×3): 15 mg via ORAL
  Administered 2011-11-18: 5 mg via ORAL
  Administered 2011-11-18 – 2011-11-19 (×7): 15 mg via ORAL
  Filled 2011-11-17 (×9): qty 3
  Filled 2011-11-17: qty 2
  Filled 2011-11-17: qty 3

## 2011-11-17 MED ORDER — 0.9 % SODIUM CHLORIDE (POUR BTL) OPTIME
TOPICAL | Status: DC | PRN
  Administered 2011-11-17: 1000 mL

## 2011-11-17 MED ORDER — MIDAZOLAM HCL 5 MG/5ML IJ SOLN
INTRAMUSCULAR | Status: DC | PRN
  Administered 2011-11-17: 2 mg via INTRAVENOUS

## 2011-11-17 MED ORDER — ONDANSETRON HCL 4 MG/2ML IJ SOLN
INTRAMUSCULAR | Status: DC | PRN
  Administered 2011-11-17: 4 mg via INTRAVENOUS

## 2011-11-17 MED ORDER — SODIUM CHLORIDE 0.9 % IV SOLN
INTRAVENOUS | Status: DC | PRN
  Administered 2011-11-17: 08:00:00 via INTRAVENOUS

## 2011-11-17 MED ORDER — PHENYLEPHRINE HCL 10 MG/ML IJ SOLN
INTRAMUSCULAR | Status: DC | PRN
  Administered 2011-11-17 (×2): 80 ug via INTRAVENOUS

## 2011-11-17 MED ORDER — HYDROMORPHONE HCL PF 1 MG/ML IJ SOLN
0.2500 mg | INTRAMUSCULAR | Status: DC | PRN
  Administered 2011-11-17 (×2): 0.5 mg via INTRAVENOUS

## 2011-11-17 SURGICAL SUPPLY — 64 items
BANDAGE ESMARK 6X9 LF (GAUZE/BANDAGES/DRESSINGS) ×1 IMPLANT
BLADE SURG 10 STRL SS (BLADE) ×2 IMPLANT
BNDG COHESIVE 4X5 TAN STRL (GAUZE/BANDAGES/DRESSINGS) ×2 IMPLANT
BNDG COHESIVE 6X5 TAN STRL LF (GAUZE/BANDAGES/DRESSINGS) ×2 IMPLANT
BNDG ESMARK 6X9 LF (GAUZE/BANDAGES/DRESSINGS) ×2
CHLORAPREP W/TINT 26ML (MISCELLANEOUS) ×2 IMPLANT
CLOTH BEACON ORANGE TIMEOUT ST (SAFETY) ×2 IMPLANT
COVER SURGICAL LIGHT HANDLE (MISCELLANEOUS) ×2 IMPLANT
CUFF TOURNIQUET SINGLE 34IN LL (TOURNIQUET CUFF) ×2 IMPLANT
CUFF TOURNIQUET SINGLE 44IN (TOURNIQUET CUFF) IMPLANT
DRAPE C-ARM 42X72 X-RAY (DRAPES) ×2 IMPLANT
DRAPE U-SHAPE 47X51 STRL (DRAPES) ×2 IMPLANT
DRSG ADAPTIC 3X8 NADH LF (GAUZE/BANDAGES/DRESSINGS) ×2 IMPLANT
DRSG PAD ABDOMINAL 8X10 ST (GAUZE/BANDAGES/DRESSINGS) ×4 IMPLANT
ELECT CAUTERY BLADE 6.4 (BLADE) ×2 IMPLANT
ELECT REM PT RETURN 9FT ADLT (ELECTROSURGICAL) ×2
ELECTRODE REM PT RTRN 9FT ADLT (ELECTROSURGICAL) ×1 IMPLANT
GLOVE BIO SURGEON STRL SZ8 (GLOVE) ×2 IMPLANT
GLOVE BIOGEL PI IND STRL 8 (GLOVE) ×1 IMPLANT
GLOVE BIOGEL PI INDICATOR 8 (GLOVE) ×1
GLOVE SS BIOGEL STRL SZ 7 (GLOVE) ×1 IMPLANT
GLOVE SUPERSENSE BIOGEL SZ 7 (GLOVE) ×1
GLOVE SURG SS PI 6.5 STRL IVOR (GLOVE) ×4 IMPLANT
GOWN PREVENTION PLUS XLARGE (GOWN DISPOSABLE) ×2 IMPLANT
GOWN STRL NON-REIN LRG LVL3 (GOWN DISPOSABLE) ×2 IMPLANT
KIT BASIN OR (CUSTOM PROCEDURE TRAY) ×2 IMPLANT
KIT ROOM TURNOVER OR (KITS) ×2 IMPLANT
MANIFOLD NEPTUNE II (INSTRUMENTS) ×2 IMPLANT
NEEDLE 22X1 1/2 (OR ONLY) (NEEDLE) IMPLANT
NEEDLE HYPO 25GX1X1/2 BEV (NEEDLE) ×2 IMPLANT
NS IRRIG 1000ML POUR BTL (IV SOLUTION) ×2 IMPLANT
PACK ORTHO EXTREMITY (CUSTOM PROCEDURE TRAY) ×2 IMPLANT
PAD ARMBOARD 7.5X6 YLW CONV (MISCELLANEOUS) ×4 IMPLANT
PAD CAST 4YDX4 CTTN HI CHSV (CAST SUPPLIES) ×2 IMPLANT
PADDING CAST COTTON 4X4 STRL (CAST SUPPLIES) ×2
PADDING CAST COTTON 6X4 STRL (CAST SUPPLIES) ×2 IMPLANT
PLATE 6H 115 LT DIST ANTLAT TB (Plate) ×2 IMPLANT
SCREW CANC LAG 4X55 (Screw) ×2 IMPLANT
SCREW CORT FT 32X3.5XNONLOCK (Screw) ×1 IMPLANT
SCREW CORTICAL 3.5MM  30MM (Screw) ×2 IMPLANT
SCREW CORTICAL 3.5MM  32MM (Screw) ×1 IMPLANT
SCREW CORTICAL 3.5MM 30MM (Screw) ×2 IMPLANT
SCREW CORTICAL 3.5MM 38MM (Screw) ×2 IMPLANT
SCREW LOCK 3.5X40 DIST TIB (Screw) ×2 IMPLANT
SCREW LOCK CORT STAR 3.5X40 (Screw) ×2 IMPLANT
SCREW LOCK CORT STAR 3.5X48 (Screw) ×4 IMPLANT
SCREW LOCK CORT STAR 3.5X52 (Screw) ×4 IMPLANT
SOAP 2 % CHG 4 OZ (WOUND CARE) IMPLANT
SPONGE GAUZE 4X4 12PLY (GAUZE/BANDAGES/DRESSINGS) ×2 IMPLANT
STAPLER VISISTAT 35W (STAPLE) IMPLANT
SUCTION FRAZIER TIP 10 FR DISP (SUCTIONS) ×2 IMPLANT
SUT ETHILON 3 0 PS 1 (SUTURE) ×2 IMPLANT
SUT MON AB 3-0 SH 27 (SUTURE) ×1
SUT MON AB 3-0 SH27 (SUTURE) ×1 IMPLANT
SUT PROLENE 3 0 PS 2 (SUTURE) IMPLANT
SUT VIC AB 2-0 CT1 27 (SUTURE) ×2
SUT VIC AB 2-0 CT1 TAPERPNT 27 (SUTURE) ×2 IMPLANT
SUT VIC AB 3-0 PS2 18 (SUTURE)
SUT VIC AB 3-0 PS2 18XBRD (SUTURE) IMPLANT
SYR CONTROL 10ML LL (SYRINGE) ×2 IMPLANT
TOWEL OR 17X24 6PK STRL BLUE (TOWEL DISPOSABLE) ×2 IMPLANT
TOWEL OR 17X26 10 PK STRL BLUE (TOWEL DISPOSABLE) ×2 IMPLANT
TUBE CONNECTING 12X1/4 (SUCTIONS) ×2 IMPLANT
WATER STERILE IRR 1000ML POUR (IV SOLUTION) IMPLANT

## 2011-11-17 NOTE — Progress Notes (Signed)
Scanner in this bay is broke (bay 2). IT has been notified.

## 2011-11-17 NOTE — Anesthesia Preprocedure Evaluation (Addendum)
Anesthesia Evaluation  Patient identified by MRN, date of birth, ID band Patient awake    Reviewed: Allergy & Precautions, H&P , NPO status , Patient's Chart, lab work & pertinent test results, reviewed documented beta blocker date and time   Airway Mallampati: I TM Distance: >3 FB Neck ROM: Full    Dental  (+) Edentulous Upper, Dental Advisory Given and Lower Dentures   Pulmonary  breath sounds clear to auscultation        Cardiovascular Exercise Tolerance: Good + CAD Rhythm:Regular Rate:Normal     Neuro/Psych Anxiety Depression    GI/Hepatic hiatal hernia, GERD-  Medicated,  Endo/Other    Renal/GU      Musculoskeletal   Abdominal (+)  Abdomen: soft. Bowel sounds: normal.  Peds  Hematology   Anesthesia Other Findings MI in 1997, stents x 3, pt. On baby aspirin  Reproductive/Obstetrics                         Anesthesia Physical Anesthesia Plan  ASA: III  Anesthesia Plan: General   Post-op Pain Management:    Induction: Intravenous  Airway Management Planned: Oral ETT  Additional Equipment:   Intra-op Plan:   Post-operative Plan: Extubation in OR  Informed Consent: I have reviewed the patients History and Physical, chart, labs and discussed the procedure including the risks, benefits and alternatives for the proposed anesthesia with the patient or authorized representative who has indicated his/her understanding and acceptance.   Dental advisory given  Plan Discussed with: CRNA, Anesthesiologist and Surgeon  Anesthesia Plan Comments:         Anesthesia Quick Evaluation

## 2011-11-17 NOTE — Progress Notes (Signed)
CARE MANAGEMENT NOTE 11/17/2011  Patient:  Larry Randall, Larry Randall   Account Number:  [id number]  Date Initiated:  11/12/2011  Documentation initiated by:  Anette Guarneri  Subjective/Objective Assessment:   Bilateral calcaneous frax  9/23 closed reduction left calcenous, ext. fixator, splint right calcanous  9/26 ORIF right calcaneous     Action/Plan:   home with [facility] services   Anticipated DC Date:  11/16/2011   Anticipated DC Plan:  HOME W HOME HEALTH SERVICES      DC Planning Services  CM consult      PAC Choice  DURABLE MEDICAL EQUIPMENT  HOME HEALTH   Choice offered to / List presented to:     DME arranged  HOSPITAL BED  WHEELCHAIR - MANUAL  TUB BENCH  3-N-1      DME agency  OTHER - SEE NOTE     HH arranged  HH-1 RN  HH-2 PT  HH-3 OT      HH agency  OTHER - SEE NOTE   Status of service:  In process, will continue to follow Medicare Important Message given?  NO (If response is "NO", the following Medicare IM given date fields will be blank) Date Medicare IM given:   Date Additional Medicare IM given:    Discharge Disposition:    Per UR Regulation:  Reviewed for med. necessity/level of care/duration of stay  If discussed at Long Length of Stay Meetings, dates discussed:    Comments:  11/17/11 11:00 Vance Peper, RN BSN Case Manager CM faxed home health orders and DME orders to CM/UR Christain [place] @ [phone]. Dr. Victorino Dike stated patient will need RN for PT/INR and  [facility] coumadin management, informed Liborio Nixon that [facility] and [facility] can monitor coumadin. Will follow.

## 2011-11-17 NOTE — Progress Notes (Signed)
Patient discussed with RNCM- Vance Peper. Patient will return home in next 1-2 days- family is arranging for a ramp to be built for patient. He will require EMS transport home at d/c.  EMS form completed.  CSW will assist when pt is d/c'd.  Lorri Frederick. [place] Pugh  [phone]

## 2011-11-17 NOTE — Interval H&P Note (Signed)
History and Physical Interval Note:  11/17/2011 7:14 AM  Larry Randall  has presented today for surgery, with the diagnosis of left tibial pIlon fracture  The various methods of treatment have been discussed with the patient and family. After consideration of risks, benefits and other options for treatment, the patient has consented to  Procedure(s) (LRB) with comments: OPEN REDUCTION INTERNAL FIXATION (ORIF) TIBIA FRACTURE (Left) - REMOVAL OF EXFIX, ORIF LEFT TIBIAL PILON FRACTURE as a surgical intervention .  The patient's history has been reviewed, patient examined, no change in status, stable for surgery.  I have reviewed the patient's chart and labs.  Questions were answered to the patient's satisfaction.     Toni Arthurs

## 2011-11-17 NOTE — Anesthesia Postprocedure Evaluation (Signed)
  Anesthesia Post-op Note  Patient: Larry Randall  Procedure(s) Performed: Procedure(s) (LRB) with comments: OPEN REDUCTION INTERNAL FIXATION (ORIF) TIBIA FRACTURE (Left) - REMOVAL OF EXFIX, ORIF LEFT TIBIAL PILON FRACTURE  Patient Location: PACU  Anesthesia Type: General  Level of Consciousness: awake and alert   Airway and Oxygen Therapy: Patient Spontanous Breathing and Patient connected to nasal cannula oxygen  Post-op Pain: mild  Post-op Assessment: Post-op Vital signs reviewed  Post-op Vital Signs: Reviewed  Complications: No apparent anesthesia complications

## 2011-11-17 NOTE — Consult Note (Signed)
ANTICOAGULATION CONSULT - COUMADIN  Pharmacy Consult for Coumadin  HPI: The patient is a 61 y.o. male who was admitted after falling off a ladder resulting in significant fractures to both LE's.  Now s/p ORIF L-Tib Fx, removal of Exfix L-Leg/ankle, ORIF L-Tibial Pilon Fx, ORIF of Calcaneous Fx R-Foot.  He will be placed on Coumadin for VTE prophylaxis post-op.  Allergies: No Known Allergies  Height/Weight: Height: 5\' 10"  (177.8 cm) Weight: 195 lb (88.451 kg) IBW/kg (Calculated) : 73   Vitals: Blood pressure 151/88, pulse 110, temperature 97.4 F (36.3 C), temperature source Oral, resp. rate 14, height 5\' 10"  (1.778 m), weight 195 lb (88.451 kg), SpO2 99.00%.  Medical / Surgical History: Past Medical History  Diagnosis Date  . Anxiety   . Arthritis   . GERD (gastroesophageal reflux disease)   . Hyperlipemia   . CAD (coronary artery disease) FOLLOWED PCP  DR Barnetta Chapel  [facility])    PT DENIES CARDIAC SYMPTOMS  . History of MI (myocardial infarction) 1996--  S/P ANGIOPLASTY X3 STENTS  . S/P primary angioplasty with coronary stent X3 STENTS  1996  POST MI    PER PT BARE METAL STENTS  . H/O hiatal hernia   . History of CHF (congestive heart failure)   . History of gastric ulcer   . Atypical rash OF MOUTH -- PER PT UNKNOWN ETIOLOGY    RX MOUTHWASH BID-  PT STATES POSSIBLE FROM GERD  . History of kidney stones   . Depression    Past Surgical History  Procedure Date  . Tonsillectomy and adenoidectomy CHILD  . Extracorporeal shock wave lithotripsy 05-02-2009  . Transurethral resection of prostate X2   YRS AGO  . Coronary angioplasty with stent placement 1996    PER PT X3 STENTS (BARE METAL)  . Transurethral resection of prostate 09/07/2011  . External fixation leg 11/09/2011  . Orif calcaneous fracture 11/12/2011   Current Labs: Lab Results  Component Value Date   WBC 12.5* 11/09/2011   HGB 13.8 11/09/2011   HCT 38.8* 11/09/2011   MCV 85.7 11/09/2011   PLT 292 11/09/2011   Lab Results  Component Value Date   INR 1.18 11/09/2011   Estimated Creatinine Clearance: 85 ml/min (by C-G formula based on Cr of 1.01).  Pertinent Medication History: Medication Sig  . acyclovir (ZOVIRAX) 400 MG tablet Take 400 mg by mouth every 4 (four) hours while awake.  Marland Kitchen Alum & Mag Hydroxide-Simeth (MAGIC MOUTHWASH) SOLN Take 15 mLs by mouth 4 (four) times daily.  . AMBULATORY NON FORMULARY MEDICATION Medication Name: Intestinal Soothe & Build 1 capsule daily  . AMBULATORY NON FORMULARY MEDICATION Medication Name: Gastrex  1 capsule daily  . amitriptyline (ELAVIL) 25 MG tablet Take 25 mg by mouth at bedtime.  . ciprofloxacin (CIPRO) 500 MG tablet Take 500 mg by mouth 2 (two) times daily.  Marland Kitchen esomeprazole (NEXIUM) 40 MG capsule Take 1 capsule (40 mg total) by mouth daily before breakfast.  . gabapentin (NEURONTIN) 300 MG capsule Take 300 mg by mouth 3 (three) times daily.  . L-GLUTAMINE PO Take 1 capsule by mouth daily.  . Lactobacillus (DIGESTIVE HEALTH PROBIOTIC) CAPS Take 1 tablet by mouth daily.  . Magnesium 250 MG TABS Take 1 tablet by mouth daily.  . Multiple Vitamins-Minerals (ZINC PO) Take 20 mg by mouth daily.  . Probiotic Product (PHILLIPS COLON HEALTH) CAPS Take 1 capsule by mouth daily.  . silver sulfADIAZINE (SILVADENE) 1 % cream Apply 1 application topically daily.  Marland Kitchen  sulfamethoxazole-trimethoprim (BACTRIM DS,SEPTRA DS) 800-160 MG per tablet Take 1 tablet by mouth 2 (two) times daily.   Scheduled:    . acyclovir  400 mg Oral Q4H while awake  . amitriptyline  25 mg Oral QHS  .  ceFAZolin (ANCEF) IV  3 g Intravenous 60 min Pre-Op  . chlorhexidine  60 mL Topical Once  . docusate sodium  100 mg Oral BID  . enoxaparin (LOVENOX) injection  40 mg Subcutaneous Q24H  . gabapentin  300 mg Oral TID  . magic mouthwash  15 mL Oral QID  . pantoprazole  40 mg Oral Daily  . thyroid  60 mg Oral QAC breakfast  . DISCONTD: senna  2 tablet Oral BID    Anti-infectives     Start     Dose/Rate Route Frequency Ordered Stop   11/17/11 0600   ceFAZolin (ANCEF) 3 g in dextrose 5 % 50 mL IVPB        3 g 160 mL/hr over 30 Minutes IV 60 min pre-op 11/16/11 1115 11/17/11 0742   11/11/11 1813   ceFAZolin (ANCEF) IVPB 2 g/50 mL premix        2 g 100 mL/hr over 30 Minutes IV 60 min pre-op 11/11/11 1813 11/12/11 1530   11/10/11 0130   acyclovir (ZOVIRAX) 200 MG capsule 400 mg        400 mg PO Every 4h W.A. 11/10/11 0124           Assessment:  Baseline INR = 1.18,  HGB 13.8 .  Goals:  Target INR of 2-3.  Plan:  Will begin with Coumadin 7.5 mg po today.    Daily INR's, CBC.  Continue Lovenox bridging 40 mg sq q 24 hrs.  Hatsuko Bizzarro, Elisha Headland, Pharm. D. 11/17/2011, 2:04 PM

## 2011-11-17 NOTE — Anesthesia Procedure Notes (Signed)
Procedure Name: Intubation Date/Time: 11/17/2011 7:42 AM Performed by: Ellin Goodie Pre-anesthesia Checklist: Patient identified, Emergency Drugs available, Suction available, Patient being monitored and Timeout performed Patient Re-evaluated:Patient Re-evaluated prior to inductionOxygen Delivery Method: Circle system utilized Preoxygenation: Pre-oxygenation with 100% oxygen Intubation Type: IV induction Ventilation: Mask ventilation without difficulty Laryngoscope Size: Mac and 3 Grade View: Grade I Tube type: Oral Tube size: 7.5 mm Number of attempts: 1 Airway Equipment and Method: Stylet Placement Confirmation: ETT inserted through vocal cords under direct vision,  positive ETCO2 and breath sounds checked- equal and bilateral Secured at: 22 cm Tube secured with: Tape Dental Injury: Teeth and Oropharynx as per pre-operative assessment

## 2011-11-17 NOTE — Brief Op Note (Signed)
11/08/2011 - 11/17/2011  9:58 AM  PATIENT:  Larry Randall  61 y.o. male  PRE-OPERATIVE DIAGNOSIS:  Left tibial pilon fracture s/p closed reduction and external fixation  POST-OPERATIVE DIAGNOSIS:  Left tibial pilon fracture s/p closed reduction and external fixation  Procedure(s): 1.  Removal of external fixation from left lower extremity 2.  ORIF of left tibial pilon fracture 3.  Fluoro > 1 hour  SURGEON:  Toni Arthurs, MD  ASSISTANT: n/a  ANESTHESIA:   General  EBL:  minimal   TOURNIQUET:   Total Tourniquet Time Documented: area (Left) - 100 minutes  COMPLICATIONS:  None apparent  DISPOSITION:  Extubated, awake and stable to recovery.  DICTATION ID:  [id number]

## 2011-11-17 NOTE — Transfer of Care (Signed)
Immediate Anesthesia Transfer of Care Note  Patient: Larry Randall  Procedure(s) Performed: Procedure(s) (LRB) with comments: OPEN REDUCTION INTERNAL FIXATION (ORIF) TIBIA FRACTURE (Left) - REMOVAL OF EXFIX, ORIF LEFT TIBIAL PILON FRACTURE  Patient Location: PACU  Anesthesia Type: General  Level of Consciousness: awake, alert  and oriented  Airway & Oxygen Therapy: Patient Spontanous Breathing  Post-op Assessment: Report given to PACU RN  Post vital signs: stable  Complications: No apparent anesthesia complications

## 2011-11-17 NOTE — Progress Notes (Signed)
CARE MANAGEMENT NOTE 11/17/2011  Patient:  Larry Randall, Larry Randall   Account Number:  [id number]  Date Initiated:  11/12/2011  Documentation initiated by:  Anette Guarneri   SComments:  11/17/11 1500 Vance Peper, RN BSN CM spoke with patient's daughterMorrie Sheldon. Mr. Yanke will be going to her home- [address] [place], [place], [place] [address]. Home: [phone], Cell [phone]. [name] states ramp is being built and will  be completed in time for patient discharge on Thursday. Notified Erin Hearing with [facility]. Patient will need ambulance transportation home. [name] stated that home health needs to contact her for directions as most GPS do not give accurate directions to her home.

## 2011-11-17 NOTE — Preoperative (Signed)
Beta Blockers   Reason not to administer Beta Blockers:Not Applicable 

## 2011-11-18 LAB — PROTIME-INR
INR: 1.18 (ref 0.00–1.49)
Prothrombin Time: 14.8 seconds (ref 11.6–15.2)

## 2011-11-18 LAB — CBC
Hemoglobin: 12.6 g/dL — ABNORMAL LOW (ref 13.0–17.0)
Platelets: 515 10*3/uL — ABNORMAL HIGH (ref 150–400)
RBC: 4.24 MIL/uL (ref 4.22–5.81)

## 2011-11-18 MED ORDER — ENOXAPARIN SODIUM 40 MG/0.4ML ~~LOC~~ SOLN: 40.0000 mg | SUBCUTANEOUS | Status: DC

## 2011-11-18 MED ORDER — WARFARIN SODIUM 5 MG PO TABS: 5.0000 mg | ORAL_TABLET | Freq: Once | ORAL | Status: DC

## 2011-11-18 MED ORDER — OXYCODONE HCL 5 MG PO TABS: 5.0000 mg | ORAL_TABLET | ORAL | Status: DC | PRN

## 2011-11-18 MED ORDER — SENNOSIDES 8.6 MG PO TABS: 2.0000 | ORAL_TABLET | Freq: Every day | ORAL | Status: DC

## 2011-11-18 MED ORDER — SENNA 8.6 MG PO TABS
2.0000 | ORAL_TABLET | Freq: Two times a day (BID) | ORAL | Status: DC
  Administered 2011-11-18 – 2011-11-19 (×3): 17.2 mg via ORAL
  Filled 2011-11-18 (×4): qty 2

## 2011-11-18 MED ORDER — MAGIC MOUTHWASH: 15.0000 mL | Freq: Four times a day (QID) | ORAL | Status: DC

## 2011-11-18 MED ORDER — WARFARIN SODIUM 7.5 MG PO TABS
7.5000 mg | ORAL_TABLET | Freq: Once | ORAL | Status: AC
  Administered 2011-11-18: 7.5 mg via ORAL
  Filled 2011-11-18 (×2): qty 1

## 2011-11-18 MED ORDER — METHOCARBAMOL 500 MG PO TABS: 500.0000 mg | ORAL_TABLET | Freq: Four times a day (QID) | ORAL | Status: DC | PRN

## 2011-11-18 MED ORDER — DSS 100 MG PO CAPS: 100.0000 mg | ORAL_CAPSULE | Freq: Two times a day (BID) | ORAL | Status: DC

## 2011-11-18 NOTE — Progress Notes (Signed)
Occupational Therapy Treatment Patient Details Name: Larry Randall MRN: [id number] DOB: 02-Aug-1950 Today's Date: 11/18/2011 Time: 4098-1191 OT Time Calculation (min): 26 min  OT Assessment / Plan / Recommendation Comments on Treatment Session Pt. able to complete UB and LB bathing in bed and chair with set up. Pt. is able to transfer to wheelchair using a transfer board and min (A) to support his LE's. Pt. motivated to get home.    Follow Up Recommendations  Home health OT    Barriers to Discharge       Equipment Recommendations  Wheelchair (measurements);Wheelchair cushion (measurements);Hospital bed;Other (comment)    Recommendations for Other Services    Frequency Min 2X/week   Plan Discharge plan remains appropriate    Precautions / Restrictions Precautions Precautions: Fall Restrictions Weight Bearing Restrictions: Yes RLE Weight Bearing: Non weight bearing LLE Weight Bearing: Non weight bearing   Pertinent Vitals/Pain Pt. Reports pain 2/10    ADL  Grooming: Performed;Wash/dry face;Wash/dry hands;Set up Where Assessed - Grooming: Supported sitting Upper Body Bathing: Performed;Chest;Right arm;Left arm;Abdomen;Set up Where Assessed - Upper Body Bathing: Supported sitting Lower Body Bathing: Performed;Set up Where Assessed - Lower Body Bathing: Lean right and/or left;Supported sitting Upper Body Dressing: Performed;Supervision/safety Where Assessed - Upper Body Dressing: Supported sitting Toilet Transfer: Mining engineer Method: Scientist, research (life sciences): Drop arm bedside commode Toileting - Clothing Manipulation and Hygiene: Performed;Supervision/safety Where Assessed - Engineer, mining and Hygiene: Lean right and/or left Transfers/Ambulation Related to ADLs: Pt. completed lateral transfer to wheelchair with min (A) for LE support ADL Comments: [email or web address] UB and LB bathing seated in bed with setup (A).  Pt. able to transfer to wheelchair using transfer board with min (A) for LE support.     OT Diagnosis:    OT Problem List:   OT Treatment Interventions:     OT Goals Acute Rehab OT Goals OT Goal Formulation: With patient Time For Goal Achievement: 11/24/11 Potential to Achieve Goals: Good ADL Goals Pt Will Perform Grooming: with set-up;Sitting, chair;Supported ADL Goal: Grooming - Progress: Met Pt Will Perform Upper Body Bathing: with set-up;Sitting, chair ADL Goal: Upper Body Bathing - Progress: Met Pt Will Perform Lower Body Bathing: with min assist;Sitting, chair;Sitting, edge of bed;with adaptive equipment ADL Goal: Lower Body Bathing - Progress: Met Pt Will Perform Upper Body Dressing: with set-up;Sitting, chair;Supported ADL Goal: Patent attorney - Progress: Met Pt Will Perform Lower Body Dressing: with min assist;Sitting, bed;with adaptive equipment Pt Will Transfer to Toilet: with min assist;Anterior-posterior transfer;3-in-1 ADL Goal: Statistician - Progress: Met Pt Will Perform Toileting - Hygiene: with min assist;Leaning right and/or left on 3-in-1/toilet ADL Goal: Toileting - Hygiene - Progress: Met Miscellaneous OT Goals Miscellaneous OT Goal #1: Pt will tolerate Lt foot drop splint without symptoms or signs of skin break down. OT Goal: Miscellaneous Goal #1 - Progress: Met  Visit Information  Last OT Received On: 11/18/11 Assistance Needed: +2 PT/OT Co-Evaluation/Treatment: Yes    Subjective Data      Prior Functioning       Cognition  Overall Cognitive Status: Appears within functional limits for tasks assessed/performed Arousal/Alertness: Awake/alert Orientation Level: Appears intact for tasks assessed Behavior During Session: [facility] for tasks performed    Mobility   Bed Mobility Bed Mobility: Supine to Sit;Sitting - Scoot to Edge of Bed;Sit to Supine Supine to Sit: 5: Supervision Sitting - Scoot to Edge of Bed: 5: Supervision (verbal cues to  align his hips square with the bed.) Sit to  Supine: 5: Supervision Details for Bed Mobility Assistance: Pt required no assistance.  Transfers Details for Transfer Assistance: Pt.able to transfer to wheelchair with min (A) to support his LE's. Verbal cues for correct sequencing of transfer.                  End of Session OT - End of Session Activity Tolerance: Patient tolerated treatment well Patient left: in bed;with call bell/phone within reach Nurse Communication: Weight bearing status  GO     Cleora Fleet 11/18/2011, 3:22 PM

## 2011-11-18 NOTE — Progress Notes (Signed)
Agree with PT treatment note.  Jeffree Cazeau, PT DPT [phone]  

## 2011-11-18 NOTE — Progress Notes (Signed)
I agree with the following treatment note after reviewing documentation.   Johnston, Elmer Boutelle Brynn   OTR/L Pager: [phone] Office: [phone] .   

## 2011-11-18 NOTE — Op Note (Signed)
Larry Randall, CHRISTMAS NO.:  [id number]  MEDICAL RECORD NO.:  [id number]  LOCATION:  5N31C                        FACILITY:  [facility]  PHYSICIAN:  Toni Arthurs, MD        DATE OF BIRTH:  1950/09/08  DATE OF PROCEDURE:  11/17/2011 DATE OF DISCHARGE:                              OPERATIVE REPORT   PREOPERATIVE DIAGNOSIS:  Left tibial pilon fracture status post closed reduction and external fixation.  POSTOPERATIVE DIAGNOSIS:  Left tibial pilon fracture status post closed reduction and external fixation.  PROCEDURE: 1. Removal of external fixator from left lower extremity under     anesthesia. 2. Open reduction and internal fixation of left tibial pilon fracture. 3. Intraoperative interpretation of fluoroscopic imaging greater than     1 hour.  SURGEON:  Toni Arthurs, MD  ANESTHESIA:  General.  ESTIMATED BLOOD LOSS:  Minimal.  TOURNIQUET TIME:  One hour and 40 minutes at 225 mmHg.  COMPLICATIONS:  None apparent.  DISPOSITION:  Extubated, awake, and stable to recovery.  INDICATION FOR PROCEDURE:  The patient is a 61 year old male with past medical history significant for coronary artery disease.  He fell off a ladder at work a week ago, landing on his feet.  He sustained bilateral calcaneus fractures and a left tibial pilon fracture.  On the day of his injury, he went to the operating room for closed reduction and application of an external fixator of the left lower extremity.  He presents now for open reduction and internal fixation after the external fixator was removed.  His swelling has resolved.  His skin wrinkles normally.  He understands the risks and benefits, the alternative treatment options.  He elects surgical treatment and specifically understands risks of bleeding, infection, nerve damage, blood clots, need for additional surgery, amputation, and death.  PROCEDURE IN DETAIL:  After preoperative consent was obtained and the correct operative  site was identified, the patient was brought to the operating room and placed supine on the operating table.  General anesthesia was induced.  Preoperative antibiotics were administered. Surgical time-out was taken.  Left lower extremity was prepped and draped in standard sterile fashion with tourniquet around the thigh. Prior to prepping and draping, the external fixator was removed in its entirety.  A longitudinal incision was marked on the anterior aspect of the ankle.  The extremity was exsanguinated and tourniquet was inflated to 225 mmHg.  The previously marked incision was made and sharp dissection was carried down through the skin and subcutaneous tissue, taking care to protect the branches of the superficial peroneal nerve. The extensor retinaculum was incised over the EHL tendon.  The interval between the EHL and tibialis anterior was then developed.  The neurovascular bundle was mobilized and retracted laterally.  It was protected for the duration of the case.  The patient's comminuted distal tibia fracture was identified.  It was opened and cleaned of all hematoma.  All nonviable fragments of cartilage were removed from the joint.  The fracture was then reduced and provisionally pinned.  AP and lateral fluoroscopic images showed appropriate reduction of the fracture.  The 6 hole wide anterior tibial plate was selected from the [employer] ALPS  distal tibia plating system.  This was applied and provisionally pinned.  AP and lateral fluoroscopic images were obtained confirming appropriate placement of the plate.  A 2.7 mm drill bit was then used to drill through the distal central hole in the plate in bicortical fashion.  A 4 mm partially threaded screw was inserted and used to compress the coronal plane fracture line.  The remaining distal holes were drilled and filled with locking screws and the partially threaded screw was then removed and replaced with a locking screw. Proximally  the plate had been fixed with a provisional fixation pin. The medial malleolus fragment was then reduced and 2 locking screws were inserted into the medial mallet.  The proximal end of the plate was then fixed to the tibia with 3 bicortical nonlocking screws.  Oblique images were obtained confirming the appropriate length of the screws.  A final oblique screw was placed through the distal oblong hole in the plate, securing the medial fragment in bicortical fashion to the plate.  Final AP, mortise, and lateral views at the level of the ankle showed appropriate reduction of the fracture.  The proximal end of the plate showed appropriate position and length of all hardware.  The wound was irrigated copiously.  The anterior ankle joint capsule was repaired with 2-0 Vicryl simple sutures.  The extensor retinaculum was then repaired with figure-of-eight and simple sutures of 2-0 Vicryl.  Subcutaneous tissue was approximated with inverted simple sutures of 3-0 Monocryl. The skin was closed with a running 3-0 nylon.  Once the wound was completely closed, the pin tracts were all debrided with a rongeur and irrigated copiously.  Subcutaneous tissue was then infiltrated with 0.25% Marcaine around the incision.  Sterile dressings were applied followed by a compression wrap.  Short-leg splint was applied.  The patient was then awakened from anesthesia and transported to the recovery room in stable condition.  The tourniquet had been released at 100 minutes after application of the dressings.  FOLLOWUP PLAN:  The patient will be nonweightbearing on the left lower extremity.  He will return to [place] and continue with physical therapy.  He will start Coumadin tonight since both lower extremities are immobilized.     Toni Arthurs, MD     JH/MEDQ  D:  11/17/2011  T:  11/18/2011  Job:  [id number]

## 2011-11-18 NOTE — Progress Notes (Signed)
CARE MANAGEMENT NOTE 11/18/2011  Patient:  Larry Randall, Larry Randall   Account Number:  [id number]  Date Initiated:  11/12/2011  Documentation initiated by:  Anette Guarneri  Subjective/Objective Assessment:   Bilateral calcaneous frax  9/23 closed reduction left calcenous, ext. fixator, splint right calcanous  9/26 ORIF right calcaneous     Action/Plan:   home with [facility] services   Anticipated DC Date:  11/19/2011   Anticipated DC Plan:  HOME W HOME HEALTH SERVICES      DC Planning Services  CM consult      PAC Choice  DURABLE MEDICAL EQUIPMENT  HOME HEALTH   Choice offered to / List presented to:     DME arranged  HOSPITAL BED  WHEELCHAIR - MANUAL  TUB BENCH  3-N-1      DME agency  OTHER - SEE NOTE     HH arranged  HH-1 RN  HH-2 PT  HH-3 OT      HH agency  OTHER - SEE NOTE  [facility]   Status of service:  In process, will continue to follow Medicare Important Message given?  NO (If response is "NO", the following Medicare IM given date fields will be blank) Date Medicare IM given:   Date Additional Medicare IM given:    Discharge Disposition:    Per UR Regulation:  Reviewed for med. necessity/level of care/duration of stay  If discussed at Long Length of Stay Meetings, dates discussed:    Comments:  11/18/11 1:00 Vance Peper, RN BSN CM received call from [facility] with [facility]. Patient will receive home Health through [facility]. CM notified Dr. Carie Caddy office-[phone] that they will not have to monitor PT/INR.

## 2011-11-18 NOTE — Progress Notes (Signed)
Subjective: 1 Day Post-Op Procedure(s) (LRB): OPEN REDUCTION INTERNAL FIXATION (ORIF) TIBIA FRACTURE (Left) Patient reports pain as mild.  Taking oxycodone and robaxin.  On coumadin.  Objective: Vital signs in last 24 hours: Temp:  [98.3 F (36.8 C)-101 F (38.3 C)] 98.9 F (37.2 C) (10/02 0654) Pulse Rate:  [96-115] 104  (10/02 0654) Resp:  [16-18] 18  (10/02 0654) BP: (118-144)/(74-80) 144/75 mmHg (10/02 0654) SpO2:  [97 %-98 %] 98 % (10/02 0654)  Intake/Output from previous day: 10/01 0701 - 10/02 0700 In: 1580 [P.O.:480; I.V.:1100] Out: 1800 [Urine:1800] Intake/Output this shift:     Basename 11/18/11 0540  HGB 12.6*    Basename 11/18/11 0540  WBC 15.7*  RBC 4.24  HCT 37.4*  PLT 515*   No results found for this basename: NA:2,K:2,CL:2,CO2:2,BUN:2,CREATININE:2,GLUCOSE:2,CALCIUM:2 in the last 72 hours  Basename 11/18/11 0540  LABPT --  INR 1.18    bilat LEs splinted.  Wiggles toes bilat.  Feels LT.  Brisk cap refill.  Assessment/Plan: 1 Day Post-Op Procedure(s) (LRB): OPEN REDUCTION INTERNAL FIXATION (ORIF) TIBIA FRACTURE (Left) Up with therapy  Plan d/c home tomorrow. Continue NWB B LEs  Larry Randall 11/18/2011, 12:45 PM

## 2011-11-18 NOTE — Progress Notes (Signed)
ANTICOAGULATION CONSULT - COUMADIN  Pharmacy Consult for Coumadin  HPI: The patient is a 61 y.o. male who was admitted after falling off a ladder resulting in significant fractures to both LE's. POD # 2 s/p ORIF L-Tib Fx, removal of Exfix L-Leg/ankle, ORIF L-Tibial Pilon Fx, POD # 3 ORIF of Calcaneous Fx R-Foot. He in on Coumadin for VTE prophylaxis post-op.  Allergies: No Known Allergies  Height/Weight: Height: 5\' 10"  (177.8 cm) Weight: 195 lb (88.451 kg) IBW/kg (Calculated) : 73   Vitals: Blood pressure 144/75, pulse 104, temperature 98.9 F (37.2 C), temperature source Oral, resp. rate 18, height 5\' 10"  (1.778 m), weight 195 lb (88.451 kg), SpO2 98.00%.  Medical / Surgical History: Past Medical History  Diagnosis Date  . Anxiety   . Arthritis   . GERD (gastroesophageal reflux disease)   . Hyperlipemia   . CAD (coronary artery disease) FOLLOWED PCP  DR Barnetta Chapel  [facility])    PT DENIES CARDIAC SYMPTOMS  . History of MI (myocardial infarction) 1996--  S/P ANGIOPLASTY X3 STENTS  . S/P primary angioplasty with coronary stent X3 STENTS  1996  POST MI    PER PT BARE METAL STENTS  . H/O hiatal hernia   . History of CHF (congestive heart failure)   . History of gastric ulcer   . Atypical rash OF MOUTH -- PER PT UNKNOWN ETIOLOGY    RX MOUTHWASH BID-  PT STATES POSSIBLE FROM GERD  . History of kidney stones   . Depression    Past Surgical History  Procedure Date  . Tonsillectomy and adenoidectomy CHILD  . Extracorporeal shock wave lithotripsy 05-02-2009  . Transurethral resection of prostate X2   YRS AGO    BEFORE 1996  . Coronary angioplasty with stent placement 1996    PER PT X3 STENTS (BARE METAL)  . Transurethral resection of prostate 09/07/2011    Procedure: TRANSURETHRAL RESECTION OF THE PROSTATE WITH GYRUS INSTRUMENTS;  Surgeon: Marcine Matar, MD;  Location: [facility];  Service: Urology;  Laterality: N/A;  2 HRS NEEDS A  BED  gyrus loop       . External fixation leg 11/09/2011    Procedure: EXTERNAL FIXATION LEG;  Surgeon: Toni Arthurs, MD;  Location: [facility] OR;  Service: Orthopedics;  Laterality: Left;  . Orif calcaneous fracture 11/12/2011    Procedure: OPEN REDUCTION INTERNAL FIXATION (ORIF) CALCANEOUS FRACTURE;  Surgeon: Toni Arthurs, MD;  Location: [facility] OR;  Service: Orthopedics;  Laterality: Right;  ORIF RIGHT CALCANEOUS FRACTURE    Current Labs:  [facility] 11/18/11 0540  HGB 12.6*  HCT 37.4*  PLT 515*  LABPROT 14.8  INR 1.18   Lab Results  Component Value Date   INR 1.18 11/18/2011   INR 1.18 11/09/2011   Estimated Creatinine Clearance: 85 ml/min (by C-G formula based on Cr of 1.01).  Pertinent Medication History: Medication Sig  . acyclovir (ZOVIRAX) 400 MG tablet Take 400 mg by mouth every 4 (four) hours while awake.  Marland Kitchen Alum & Mag Hydroxide-Simeth (MAGIC MOUTHWASH) SOLN Take 15 mLs by mouth 4 (four) times daily.  . AMBULATORY NON FORMULARY MEDICATION Medication Name: Intestinal Soothe & Build 1 capsule daily  . AMBULATORY NON FORMULARY MEDICATION Medication Name: Gastrex  1 capsule daily  . amitriptyline (ELAVIL) 25 MG tablet Take 25 mg by mouth at bedtime.  . ciprofloxacin (CIPRO) 500 MG tablet Take 500 mg by mouth 2 (two) times daily.  Marland Kitchen esomeprazole (NEXIUM) 40 MG capsule Take 1 capsule (40 mg  total) by mouth daily before breakfast.  . gabapentin (NEURONTIN) 300 MG capsule Take 300 mg by mouth 3 (three) times daily.  . L-GLUTAMINE PO Take 1 capsule by mouth daily.  . Lactobacillus CAPS Take 1 tablet by mouth daily.  . Magnesium 250 MG TABS Take 1 tablet by mouth daily.  . Multiple Vitamins-Minerals (ZINC PO) Take 20 mg by mouth daily.  . Probiotic PHILLIPS COLON HEALTH CAPS Take 1 capsule by mouth daily.  . silver sulfADIAZINE (SILVADENE) 1 % cream Apply 1 application topically daily.  Marland Kitchen sulfamethoxazole-trimethoprim (BACTRIM DS,SEPTRA DS) 800-160 MG per tablet Take 1 tablet by mouth 2  (two) times daily.   Scheduled:    . acyclovir  400 mg Oral Q4H while awake  . amitriptyline  25 mg Oral QHS  . coumadin book  1 each Does not apply Once  . docusate sodium  100 mg Oral BID  . enoxaparin (LOVENOX) injection  40 mg Subcutaneous Q24H  . gabapentin  300 mg Oral TID  . magic mouthwash  15 mL Oral QID  . pantoprazole  40 mg Oral Daily  . thyroid  60 mg Oral QAC breakfast  . warfarin  7.5 mg Oral ONCE-1800   Assessment:  INR 1.18.   H/H/P stable.  No complications noted  Goals:  Target INR of 2-3.  Plan:  Will repeat Coumadin 7.5 mg today.    Daily INR's, CBC.  Warfarin discharge education completed.  Jamice Carreno, Elisha Headland, Pharm. D. 11/18/2011, 10:54 AM

## 2011-11-18 NOTE — Progress Notes (Addendum)
Physical Therapy Progress Note   11/18/11 1541  PT Visit Information  Last PT Received On 11/18/11  Assistance Needed +2  PT/OT Co-Evaluation/Treatment Yes  PT Time Calculation  PT Start Time 1438  PT Stop Time 1500  PT Time Calculation (min) 23 min  Subjective Data  Subjective I am feeling more pain today  Patient Stated Goal To go home  Precautions  Precautions Fall  Restrictions  Weight Bearing Restrictions Yes  RLE Weight Bearing NWB  LLE Weight Bearing NWB  Cognition  Overall Cognitive Status Appears within functional limits for tasks assessed/performed  Arousal/Alertness Awake/alert  Orientation Level Appears intact for tasks assessed  Behavior During Session [facility] for tasks performed  Bed Mobility  Bed Mobility Supine to Sit;Sitting - Scoot to [employer] of Bed;Sit to Supine;Scooting to [facility]  Supine to Sit 5: Supervision  Sitting - Scoot to Edge of Bed 5: Supervision  Sit to Supine 5: Supervision  Scooting to [facility] 4: Min guard  Details for Bed Mobility Assistance Supervision for safety and cues for proper technique  Transfers  Transfers Lateral/Scoot Transfers  Lateral/Scoot Transfers 4: Min assist  Details for Transfer Assistance (A) with supporting bilateral LE and cues for proper technique  Ambulation/Gait  Ambulation/Gait Assistance Not tested (comment)  Engineering geologist Yes  Wheelchair Assistance 5: Supervision  Wheelchair Assistance Details (indicate cue type and reason) (Cues for locating items on w/c.  Sliding board transfer)  Wheelchair Parts Management Supervision/cueing  PT - End of Session  Activity Tolerance Patient tolerated treatment well  Patient left in bed;with call bell/phone within reach  Nurse Communication Mobility status  PT - Assessment/Plan  Comments on Treatment Session Pt able to perform sliding board transfer with w/c.  Plan for next session is to practice transfer and propulsion with own w/c Goals updated for pt.  PT  Plan Discharge plan remains appropriate;Frequency remains appropriate  PT Frequency Min 5X/week  Recommendations for Other Services Other (comment)  Follow Up Recommendations Home health PT  Equipment Recommended Wheelchair (measurements);Wheelchair cushion (measurements);Hospital bed;Other (comment) (elevated leg rests), sliding board  Acute Rehab PT Goals  PT Goal Formulation With patient  Time For Goal Achievement 11/20/11  Potential to Achieve Goals Good  Pt will go Supine/Side to Sit with supervision  PT Goal: Supine/Side to Sit - Progress Goal set today  Pt will Sit at [facility] of Bed with supervision;1-2 min  PT Goal: Sit at Edge Of Bed - Progress Goal set today  Pt will go Sit to Supine/Side with supervision  PT Goal: Sit to Supine/Side - Progress Goal set today  Pt will Transfer Bed to Chair/Chair to Bed with supervision  PT Transfer Goal: Bed to Chair/Chair to Bed - Progress Goal set today  Pt will Perform Home Exercise Program Independently  PT Goal: Perform Home Exercise Program - Progress Goal set today  Pt will Propel Wheelchair 51 - 150 feet;Independently  PT Goal: Propel Wheelchair - Progress Goal set today    Pt report pain 2-3/10 when lying in bed and up to a 7-8/10 when moving legs.  Amy DiTommaso, SPT

## 2011-11-19 ENCOUNTER — Encounter (HOSPITAL_COMMUNITY): Payer: Self-pay | Admitting: Orthopedic Surgery

## 2011-11-19 LAB — CBC
MCH: 30.1 pg (ref 26.0–34.0)
MCHC: 33.7 g/dL (ref 30.0–36.0)
MCV: 89.3 fL (ref 78.0–100.0)
Platelets: 440 10*3/uL — ABNORMAL HIGH (ref 150–400)
RDW: 13.3 % (ref 11.5–15.5)
WBC: 10.2 10*3/uL (ref 4.0–10.5)

## 2011-11-19 MED ORDER — WARFARIN SODIUM 2.5 MG PO TABS
2.5000 mg | ORAL_TABLET | Freq: Once | ORAL | Status: DC
  Filled 2011-11-19: qty 1

## 2011-11-19 NOTE — Progress Notes (Signed)
Physical Therapy Treatment Patient Details Name: Larry Randall MRN: [id number] DOB: 05/22/1950 Today's Date: 11/19/2011 Time: 1308-6578 PT Time Calculation (min): 24 min  PT Assessment / Plan / Recommendation Comments on Treatment Session  Pt educated on home environment and w/c mobility. Pt practiced HEP and was educated on how to complete the exercises.  Pt ready for d/c when deemed medically safe.  Pt refused to practice transfer but able to verbalize correct technique and able perform lateral transfer with sliding board and supervision last session.  Pt will have HHPT to continue to review transfer.  Pt continued to have no further questions.    Follow Up Recommendations  Home health PT    Barriers to Discharge  NONE      Equipment Recommendations  Wheelchair (measurements);Wheelchair cushion (measurements);Hospital bed;Other (comment) ; sliding board   Recommendations for Other Services Other (comment) (None)  Frequency Min 5X/week   Plan Discharge plan remains appropriate;Frequency remains appropriate    Precautions / Restrictions Precautions Precautions: Fall Restrictions Weight Bearing Restrictions: Yes RLE Weight Bearing: Non weight bearing LLE Weight Bearing: Non weight bearing   Pertinent Vitals/Pain 3/10 pain in Bilateral LE.    Mobility  Bed Mobility Bed Mobility: Not assessed Transfers Transfers: Not assessed Ambulation/Gait Ambulation/Gait Assistance: Not tested (comment)    Exercises General Exercises - Lower Extremity Quad Sets: AROM;Both;10 reps Short Arc Quad: AROM;Both;10 reps Heel Slides: AROM;Both;10 reps Hip ABduction/ADduction: AROM;Both;10 reps (Abduction and Pillow Squeeze.) Straight Leg Raises: AROM;10 reps;Both   PT Diagnosis:    PT Problem List:   PT Treatment Interventions:     PT Goals Acute Rehab PT Goals PT Goal Formulation: With patient Time For Goal Achievement: 11/20/11 Potential to Achieve Goals: Good Pt will Perform Home  Exercise Program: Independently PT Goal: Perform Home Exercise Program - Progress: Progressing toward goal  Visit Information  Last PT Received On: 11/19/11 Assistance Needed: +1    Subjective Data  Subjective: Feeling okay ready to leave Patient Stated Goal: To go home   Cognition  Overall Cognitive Status: Appears within functional limits for tasks assessed/performed Arousal/Alertness: Awake/alert Orientation Level: Appears intact for tasks assessed Behavior During Session: [facility] for tasks performed    Balance     End of Session PT - End of Session Activity Tolerance: Patient tolerated treatment well Patient left: in bed;with call bell/phone within reach Nurse Communication: Mobility status   GP     DITOMMASO, AMY 11/19/2011, 1:25 PM  [name], PT DPT [phone]

## 2011-11-19 NOTE — Discharge Summary (Signed)
Physician Discharge Summary  Patient ID: Larry Randall MRN: [id number] DOB/AGE: 63-11-1950 63 y.o.  Admit date: 11/08/2011 Discharge date: 11/19/2011  Admission Diagnoses:  Oral ulcers, R calcaneus fracture, left calcaneus fracture, left tibial pilon fracture, CAD, htn  Discharge Diagnoses:  Same S/p ORIF r calcaneus fracture S/p ORIF L tibial pilon fracture  Discharged Condition: stable  Hospital Course: Pt was admitted on 9/22 and underwent closed reduction and ex fix of L tibial pilon fracture.  He was taken to the OR 3 days later for ORIF of his right calcaneus fracture.  He was taken to the OR on 10/1 for ORIF of his L tibial pilon fracture and removal of his ex fix.  He did well post operatively and was started on coumadin.  He is on lovenox as well until his coumadin is therapeutic.  He is discharged to home in stable condition with [facility] OT, PT and RN.  Consults: pharmacy  Significant Diagnostic Studies: CT scans - bilat calc fxs, L tibial pilon fx  Treatments: surgery: as above  Discharge Exam: Blood pressure 120/76, pulse 99, temperature 99.3 F (37.4 C), temperature source Oral, resp. rate 18, height 5\' 10"  (1.778 m), weight 88.451 kg (195 lb), SpO2 96.00%. wn wd male in nad.  B LEs splinted.  toes with brisk cap refill.  active PF and DF at toes.  Feels LT bilat.  Disposition: 01-Home or Self Care  Discharge Orders    Future Orders Please Complete By Expires   Diet - low sodium heart healthy      Call MD / Call 911      Comments:   If you experience chest pain or shortness of breath, CALL 911 and be transported to the hospital emergency room.  If you develope a fever above 101 F, pus (white drainage) or increased drainage or redness at the wound, or calf pain, call your surgeon's office.   Constipation Prevention      Comments:   Drink plenty of fluids.  Prune juice may be helpful.  You may use a stool softener, such as Colace (over the counter) 100 mg twice a day.   Use MiraLax (over the counter) for constipation as needed.   Increase activity slowly as tolerated          Medication List     As of 11/19/2011 10:46 AM    STOP taking these medications         ciprofloxacin 500 MG tablet   Commonly known as: CIPRO      sulfamethoxazole-trimethoprim 800-160 MG per tablet   Commonly known as: BACTRIM DS,SEPTRA DS      TAKE these medications         acyclovir 400 MG tablet   Commonly known as: ZOVIRAX   Take 400 mg by mouth every 4 (four) hours while awake.      AMBULATORY NON FORMULARY MEDICATION   Medication Name: Intestinal Soothe & Build  1 capsule daily      AMBULATORY NON FORMULARY MEDICATION   Medication Name: Gastrex   1 capsule daily      amitriptyline 25 MG tablet   Commonly known as: ELAVIL   Take 25 mg by mouth at bedtime.      DIGESTIVE HEALTH PROBIOTIC Caps   Take 1 tablet by mouth daily.      DSS 100 MG Caps   Take 100 mg by mouth 2 (two) times daily.      enoxaparin 40 MG/0.4ML injection   Commonly  known as: LOVENOX   Inject 0.4 mLs (40 mg total) into the skin daily.      esomeprazole 40 MG capsule   Commonly known as: NEXIUM   Take 1 capsule (40 mg total) by mouth daily before breakfast.      gabapentin 300 MG capsule   Commonly known as: NEURONTIN   Take 300 mg by mouth 3 (three) times daily.      L-GLUTAMINE PO   Take 1 capsule by mouth daily.      magic mouthwash Soln   Take 15 mLs by mouth 4 (four) times daily.      Magnesium 250 MG Tabs   Take 1 tablet by mouth daily.      methocarbamol 500 MG tablet   Commonly known as: ROBAXIN   Take 1 tablet (500 mg total) by mouth every 6 (six) hours as needed.      oxyCODONE 5 MG immediate release tablet   Commonly known as: Oxy IR/ROXICODONE   Take 1-2 tablets (5-10 mg total) by mouth every 4 (four) hours as needed.      PHILLIPS COLON HEALTH Caps   Take 1 capsule by mouth daily.      senna 8.6 MG tablet   Commonly known as: SENOKOT   Take 2  tablets (17.2 mg total) by mouth daily.      silver sulfADIAZINE 1 % cream   Commonly known as: SILVADENE   Apply 1 application topically daily.      warfarin 5 MG tablet   Commonly known as: COUMADIN   Take 1 tablet (5 mg total) by mouth one time only at 6 PM.      ZINC PO   Take 20 mg by mouth daily.           Follow-up Information    Follow up with Corneshia Hines, Jonny Ruiz, MD. Schedule an appointment as soon as possible for a visit in 2 weeks.   Contact information:   [address], [address] [place] [address] [phone]          Signed: Toni Arthurs 11/19/2011, 10:46 AM

## 2011-11-19 NOTE — Consult Note (Signed)
ANTICOAGULATION CONSULT - COUMADIN  Pharmacy Consult for Coumadin  HPI: The patient is a 61 y.o. male who was admitted after falling off a ladder resulting in significant fractures to both LE's. POD # 2 s/p ORIF L-Tib Fx, removal of Exfix L-Leg/ankle, ORIF L-Tibial Pilon Fx, POD # 3 ORIF of Calcaneous Fx R-Foot. He in on Coumadin for VTE prophylaxis post-op.  Height/Weight: Height: 5\' 10"  (177.8 cm) Weight: 195 lb (88.451 kg) IBW/kg (Calculated) : 73   Vitals: Blood pressure 120/76, pulse 99, temperature 99.3 F (37.4 C), temperature source Oral, resp. rate 18, height 5\' 10"  (1.778 m), weight 195 lb (88.451 kg), SpO2 96.00%.  Medical / Surgical History: Past Medical History  Diagnosis Date  . Anxiety   . Arthritis   . GERD (gastroesophageal reflux disease)   . Hyperlipemia   . CAD (coronary artery disease) FOLLOWED PCP  DR Barnetta Chapel  [facility])    PT DENIES CARDIAC SYMPTOMS  . History of MI (myocardial infarction) 1996--  S/P ANGIOPLASTY X3 STENTS  . S/P primary angioplasty with coronary stent X3 STENTS  1996  POST MI    PER PT BARE METAL STENTS  . H/O hiatal hernia   . History of CHF (congestive heart failure)   . History of gastric ulcer   . Atypical rash OF MOUTH -- PER PT UNKNOWN ETIOLOGY    RX MOUTHWASH BID-  PT STATES POSSIBLE FROM GERD  . History of kidney stones   . Depression    Past Surgical History  Procedure Date  . Tonsillectomy and adenoidectomy CHILD  . Extracorporeal shock wave lithotripsy 05-02-2009  . Transurethral resection of prostate X2   YRS AGO    BEFORE 1996  . Coronary angioplasty with stent placement 1996    PER PT X3 STENTS (BARE METAL)  . Transurethral resection of prostate 09/07/2011  . External fixation leg 11/09/2011    Procedure: EXTERNAL FIXATION LEG;  Surgeon: Toni Arthurs, MD;  Location: [facility] OR;  Service: Orthopedics;  Laterality: Left;  . Orif calcaneous fracture 11/12/2011    Procedure: OPEN REDUCTION INTERNAL  FIXATION (ORIF) CALCANEOUS FRACTURE;  Surgeon: Toni Arthurs, MD;  Location: [facility] OR;  Service: Orthopedics;  Laterality: Right;  ORIF RIGHT CALCANEOUS FRACTURE    Current Labs:  [facility] 11/19/11 0450 11/18/11 0540  HGB 11.8* 12.6*  HCT 35.0* 37.4*  PLT 440* 515*  LABPROT 19.6* 14.8  INR 1.72* 1.18    Lab Results  Component Value Date   INR 1.72* 11/19/2011   INR 1.18 11/18/2011   INR 1.18 11/09/2011   Estimated Creatinine Clearance: 85 ml/min (by C-G formula based on Cr of 1.01).  Pertinent Medication History: Scheduled:    . acyclovir  400 mg Oral Q4H while awake  . amitriptyline  25 mg Oral QHS  . coumadin book  1 each Does not apply Once  . docusate sodium  100 mg Oral BID  . enoxaparin (LOVENOX) injection  40 mg Subcutaneous Q24H  . gabapentin  300 mg Oral TID  . magic mouthwash  15 mL Oral QID  . pantoprazole  40 mg Oral Daily  . senna  2 tablet Oral BID  . thyroid  60 mg Oral QAC breakfast  . warfarin  7.5 mg Oral ONCE-1800  . warfarin  1 each Does not apply Once  . Warfarin - Pharmacist Dosing Inpatient   Does not apply q1800    Assessment:  Large increase in INR 1.18  >  1.72.  No bleeding complications noted.Marland Kitchen  Goals:  Target INR of 2-3.  Plan:  Will reduce Coumadin to 2.5 mg today.    Continue daily INR's, CBC.  Avalin Briley, Elisha Headland, Pharm. D. 11/19/2011, 2:19 PM

## 2011-11-19 NOTE — Progress Notes (Signed)
Per MD- patient is stable for d/c home today- he will stay with his daughter Larry Randall during recovery period. Spoke with worker's comp Sports coach regarding ambulance authorization- she contacted a Financial controller company called [employer] and arranged for [employer].  Spoke with pt's daughterMorrie Randall- the family is building a ramp which should be completed by 4 pm.  The [employer] company will pick up patient around 4 pm.  Notified patient and his nurse- Nettie Elm of above. RNCM has arranged HH and DME. No further CSW needs identified.  Lorri Frederick. [place]  [phone]

## 2011-11-20 NOTE — Care Management Note (Signed)
    Page 1 of 2   11/20/2011     10:53:39 AM   CARE MANAGEMENT NOTE 11/20/2011  Patient:  Larry Randall, Larry Randall   Account Number:  [id number]  Date Initiated:  11/12/2011  Documentation initiated by:  Anette Guarneri  Subjective/Objective Assessment:   Bilateral calcaneous frax  9/23 closed reduction left calcenous, ext. fixator, splint right calcanous  9/26 ORIF right calcaneous     Action/Plan:   home with [facility] services   Anticipated DC Date:  11/19/2011   Anticipated DC Plan:  HOME W HOME HEALTH SERVICES      DC Planning Services  CM consult      PAC Choice  DURABLE MEDICAL EQUIPMENT  HOME HEALTH   Choice offered to / List presented to:     DME arranged  HOSPITAL BED  WHEELCHAIR - MANUAL  TUB BENCH  3-N-1  OTHER - SEE COMMENT      DME agency  OTHER - SEE NOTE     HH arranged  HH-1 RN  HH-2 PT  HH-3 OT      HH agency  OTHER - SEE NOTE  [facility]   Status of service:  Completed, signed off Medicare Important Message given?  NO (If response is "NO", the following Medicare IM given date fields will be blank) Date Medicare IM given:   Date Additional Medicare IM given:    Discharge Disposition:  HOME W HOME HEALTH SERVICES  Per UR Regulation:  Reviewed for med. necessity/level of care/duration of stay  If discussed at Long Length of Stay Meetings, dates discussed:    Comments:  11/20/11  10:00  Anette Guarneri RN/CM Faxed order for sliding board to fax#[phone], att: Erin Hearing.  11/18/11 1:00 Vance Peper, RN BSN CM received call from [facility] with [facility]. Patient will receive home Health through [facility]. CM notified Dr. Carie Caddy office-[phone] that they will not have to monitor PT/INR.     11/17/11 1500 Vance Peper, RN BSN CM spoke with patient's daughterMorrie Sheldon. Mr. Kerkhoff will be going to her home- [address] [place], [place], [place] [address]. Home: [phone], Cell [phone]. [name] states ramp is being built and will  be  completed in time for patient discharge on Thursday. Notified Erin Hearing with [facility]. Patient will need ambulance transportation home. [name] stated that home health needs to contact her for directions as most GPS do not give accurate directions to her home.   11/17/11 11:00 Vance Peper, RN BSN Case Manager CM faxed home health orders and DME orders to CM/UR Christain [place] @ [phone]. Dr. Victorino Dike stated patient will need RN for PT/INR and  [facility] coumadin management, informed Liborio Nixon that [facility] and [facility] can monitor coumadin. Will follow.  Worker comp/[employer] WC/UR Erin Hearing cell ph# [phone], ext. [phone] fax # [phone]  Worker comp adjuster is Ron Parker, claim# [id number] ph# [phone] ext. [phone] Workers Comp CM is [employer].

## 2011-11-22 NOTE — Progress Notes (Signed)
Agree with above 

## 2011-12-10 ENCOUNTER — Emergency Department (HOSPITAL_COMMUNITY)
Admission: EM | Admit: 2011-12-10 | Discharge: 2011-12-11 | Disposition: A | Discharge status: Home / Self Care | Payer: Worker's Compensation | Attending: Emergency Medicine | Admitting: Emergency Medicine

## 2011-12-10 ENCOUNTER — Encounter (HOSPITAL_COMMUNITY): Payer: Self-pay

## 2011-12-10 DIAGNOSIS — K219 Gastro-esophageal reflux disease without esophagitis: Secondary | ICD-10-CM | POA: Insufficient documentation

## 2011-12-10 DIAGNOSIS — Z79899 Other long term (current) drug therapy: Secondary | ICD-10-CM | POA: Insufficient documentation

## 2011-12-10 DIAGNOSIS — Z8719 Personal history of other diseases of the digestive system: Secondary | ICD-10-CM | POA: Insufficient documentation

## 2011-12-10 DIAGNOSIS — Z87898 Personal history of other specified conditions: Secondary | ICD-10-CM | POA: Insufficient documentation

## 2011-12-10 DIAGNOSIS — I82409 Acute embolism and thrombosis of unspecified deep veins of unspecified lower extremity: Secondary | ICD-10-CM | POA: Insufficient documentation

## 2011-12-10 DIAGNOSIS — N390 Urinary tract infection, site not specified: Secondary | ICD-10-CM | POA: Insufficient documentation

## 2011-12-10 DIAGNOSIS — Z8739 Personal history of other diseases of the musculoskeletal system and connective tissue: Secondary | ICD-10-CM | POA: Insufficient documentation

## 2011-12-10 DIAGNOSIS — R319 Hematuria, unspecified: Secondary | ICD-10-CM | POA: Insufficient documentation

## 2011-12-10 DIAGNOSIS — Z8679 Personal history of other diseases of the circulatory system: Secondary | ICD-10-CM | POA: Insufficient documentation

## 2011-12-10 DIAGNOSIS — F3289 Other specified depressive episodes: Secondary | ICD-10-CM | POA: Insufficient documentation

## 2011-12-10 DIAGNOSIS — Z87442 Personal history of urinary calculi: Secondary | ICD-10-CM | POA: Insufficient documentation

## 2011-12-10 DIAGNOSIS — Z7901 Long term (current) use of anticoagulants: Secondary | ICD-10-CM | POA: Insufficient documentation

## 2011-12-10 DIAGNOSIS — I259 Chronic ischemic heart disease, unspecified: Secondary | ICD-10-CM | POA: Insufficient documentation

## 2011-12-10 DIAGNOSIS — E785 Hyperlipidemia, unspecified: Secondary | ICD-10-CM | POA: Insufficient documentation

## 2011-12-10 DIAGNOSIS — Z8659 Personal history of other mental and behavioral disorders: Secondary | ICD-10-CM | POA: Insufficient documentation

## 2011-12-10 DIAGNOSIS — Z87828 Personal history of other (healed) physical injury and trauma: Secondary | ICD-10-CM | POA: Insufficient documentation

## 2011-12-10 DIAGNOSIS — Z87891 Personal history of nicotine dependence: Secondary | ICD-10-CM | POA: Insufficient documentation

## 2011-12-10 DIAGNOSIS — F329 Major depressive disorder, single episode, unspecified: Secondary | ICD-10-CM | POA: Insufficient documentation

## 2011-12-10 NOTE — ED Notes (Signed)
Patient presents with hematuria x 1 hour.  Patient has no penile or flank pain.  Patient is not ambulatory from bilateral lower extremity fractures that occurred 1 month ago.  Patient had a urine culture 1 week ago from PCP office.  Vitals stable.

## 2011-12-10 NOTE — ED Notes (Signed)
Pt states that he began experiencing hematuria 2 hours ago. Pt denies any flank or genital pain. Pt denies any blood in his stool.

## 2011-12-11 ENCOUNTER — Other Ambulatory Visit: Payer: Self-pay | Admitting: Internal Medicine

## 2011-12-11 LAB — URINALYSIS, ROUTINE W REFLEX MICROSCOPIC
Bilirubin Urine: NEGATIVE
Ketones, ur: NEGATIVE mg/dL
Nitrite: NEGATIVE
Protein, ur: NEGATIVE mg/dL
Urobilinogen, UA: 0.2 mg/dL (ref 0.0–1.0)

## 2011-12-11 LAB — PROTIME-INR: Prothrombin Time: 25.3 seconds — ABNORMAL HIGH (ref 11.6–15.2)

## 2011-12-11 LAB — CBC WITH DIFFERENTIAL/PLATELET
Basophils Absolute: 0 10*3/uL (ref 0.0–0.1)
HCT: 39.5 % (ref 39.0–52.0)
Hemoglobin: 13.5 g/dL (ref 13.0–17.0)
Lymphocytes Relative: 21 % (ref 12–46)
Lymphs Abs: 2.1 10*3/uL (ref 0.7–4.0)
Monocytes Absolute: 0.7 10*3/uL (ref 0.1–1.0)
Monocytes Relative: 7 % (ref 3–12)
Neutro Abs: 6.7 10*3/uL (ref 1.7–7.7)
RBC: 4.53 MIL/uL (ref 4.22–5.81)
RDW: 13.3 % (ref 11.5–15.5)
WBC: 9.8 10*3/uL (ref 4.0–10.5)

## 2011-12-11 LAB — BASIC METABOLIC PANEL
CO2: 26 mEq/L (ref 19–32)
Chloride: 102 mEq/L (ref 96–112)
Creatinine, Ser: 0.75 mg/dL (ref 0.50–1.35)
Glucose, Bld: 113 mg/dL — ABNORMAL HIGH (ref 70–99)

## 2011-12-11 LAB — URINE MICROSCOPIC-ADD ON

## 2011-12-11 MED ORDER — NITROGLYCERIN IN D5W 200-5 MCG/ML-% IV SOLN: 10.0000 ug/min | INTRAVENOUS | Status: DC

## 2011-12-11 MED ORDER — CEPHALEXIN 500 MG PO CAPS: 500.0000 mg | ORAL_CAPSULE | Freq: Four times a day (QID) | ORAL | Status: DC

## 2011-12-11 NOTE — ED Notes (Addendum)
plz disregard troponin test ran at 0110, medical record number scanned in error. Edit sheet sent to lab to delete

## 2011-12-11 NOTE — ED Notes (Signed)
Report given to PTAR. Pt preparing for transport.

## 2011-12-11 NOTE — ED Provider Notes (Signed)
History     CSN: [id number]  Arrival date & time 12/10/11  2340   First MD Initiated Contact with Patient 12/11/11 0102      Chief Complaint  Patient presents with  . Hematuria    (Consider location/radiation/quality/duration/timing/severity/associated sxs/prior treatment) HPI This patient is a 61 year old man who is currently anticoagulated with Coumadin at a dose of 5 mg each night for DVT prophylaxis. The patient sustained a left tibia fracture and a comminuted right calcaneus fracture as the result of an industrial accident. He is thus and mobile following surgery on each leg.  The patient presents today by ambulance from home after he noticed a single episode of gross hematuria. His daughter says that his urine looked by cranberry juice. The patient has no history of hematuria. He states that he has a history of prostate disease. No history of bladder cancer. Remote history of kidney stones.  Patient denies any dysuria, difficulty voiding, abdominal or flank pain. He did not pass any clots of blood. He has not had any melena or hematochezia or bruising of the skin.  Past Medical History  Diagnosis Date  . Anxiety   . Arthritis   . GERD (gastroesophageal reflux disease)   . Hyperlipemia   . CAD (coronary artery disease) FOLLOWED PCP  DR Barnetta Chapel  [facility])    PT DENIES CARDIAC SYMPTOMS  . History of MI (myocardial infarction) 1996--  S/P ANGIOPLASTY X3 STENTS  . S/P primary angioplasty with coronary stent X3 STENTS  1996  POST MI    PER PT BARE METAL STENTS  . H/O hiatal hernia   . History of CHF (congestive heart failure)   . History of gastric ulcer   . Atypical rash OF MOUTH -- PER PT UNKNOWN ETIOLOGY    RX MOUTHWASH BID-  PT STATES POSSIBLE FROM GERD  . History of kidney stones   . Depression     Past Surgical History  Procedure Date  . Tonsillectomy and adenoidectomy CHILD  . Extracorporeal shock wave lithotripsy 05-02-2009  . Transurethral  resection of prostate X2   YRS AGO    BEFORE 1996  . Coronary angioplasty with stent placement 1996    PER PT X3 STENTS (BARE METAL)  . Transurethral resection of prostate 09/07/2011    Procedure: TRANSURETHRAL RESECTION OF THE PROSTATE WITH GYRUS INSTRUMENTS;  Surgeon: Marcine Matar, MD;  [facility];  Service: Urology;  Laterality: N/A;  2 HRS NEEDS A BED  gyrus loop       . External fixation leg 11/09/2011    Procedure: EXTERNAL FIXATION LEG;  Surgeon: Toni Arthurs, MD;  Location[facility] OR;  Service: Orthopedics;  Laterality: Left;  . Orif calcaneous fracture 11/12/2011    Procedure: OPEN REDUCTION INTERNAL FIXATION (ORIF) CALCANEOUS FRACTURE;  Surgeon: Toni Arthurs, MD;  Location: [facility] OR;  Service: Orthopedics;  Laterality: Right;  ORIF RIGHT CALCANEOUS FRACTURE  . Orif tibia fracture 11/17/2011    Procedure: OPEN REDUCTION INTERNAL FIXATION (ORIF) TIBIA FRACTURE;  Surgeon: Toni Arthurs, MD;  Location: [facility] OR;  Service: Orthopedics;  Laterality: Left;  REMOVAL OF EXFIX, ORIF LEFT TIBIAL PILON FRACTURE    Family History  Problem Relation Age of Onset  . Heart disease Father   . Colon cancer Neg Hx   . Esophageal cancer Neg Hx   . Rectal cancer Neg Hx   . Stomach cancer Neg Hx     History  Substance Use Topics  . Smoking status: Former Smoker --  1.0 packs/day for 30 years    Types: Cigarettes    Quit date: 09/02/1994  . Smokeless tobacco: Never Used  . Alcohol Use: No      Review of Systems Gen: no weight loss, fevers, chills, night sweats Eyes: no discharge or drainage, no occular pain or visual changes Nose: no epistaxis or rhinorrhea Mouth: no dental pain, no sore throat Neck: no neck pain Lungs: no SOB, cough, wheezing CV: no chest pain, palpitations, dependent edema or orthopnea Abd: no abdominal pain, nausea, vomiting GU: As per history of present illness, otherwise negative MSK: As per history of present illness, otherwise negative Neuro:  no headache, no focal neurologic deficits Skin: no rash Psyche: negative.  Allergies  Review of patient's allergies indicates no known allergies.  Home Medications   Current Outpatient Rx  Name Route Sig Dispense Refill  . ACYCLOVIR 400 MG PO TABS Oral Take 400 mg by mouth every 4 (four) hours while awake.    Marland Kitchen MAGIC MOUTHWASH Oral Take 15 mLs by mouth 4 (four) times daily. 1800 mL 0  . AMBULATORY NON FORMULARY MEDICATION  Medication Name: Intestinal Soothe & Build 1 capsule daily    . AMBULATORY NON FORMULARY MEDICATION  Medication Name: Gastrex  1 capsule daily    . AMITRIPTYLINE HCL 25 MG PO TABS Oral Take 25 mg by mouth at bedtime.    . DSS 100 MG PO CAPS Oral Take 100 mg by mouth 2 (two) times daily. 60 capsule 0  . ESOMEPRAZOLE MAGNESIUM 40 MG PO CPDR Oral Take 1 capsule (40 mg total) by mouth daily before breakfast. 30 capsule 1  . GABAPENTIN 300 MG PO CAPS Oral Take 300 mg by mouth 3 (three) times daily.    . L-GLUTAMINE PO Oral Take 1 capsule by mouth daily.    Marland Kitchen DIGESTIVE HEALTH PROBIOTIC PO CAPS Oral Take 1 tablet by mouth daily.    Marland Kitchen MAGNESIUM 250 MG PO TABS Oral Take 1 tablet by mouth daily.    Marland Kitchen METHOCARBAMOL 500 MG PO TABS Oral Take 1 tablet (500 mg total) by mouth every 6 (six) hours as needed. 60 tablet 0  . ZINC PO Oral Take 20 mg by mouth daily.    . OXYCODONE HCL 5 MG PO TABS Oral Take 1-2 tablets (5-10 mg total) by mouth every 4 (four) hours as needed. 60 tablet 0  . PHILLIPS COLON HEALTH PO CAPS Oral Take 1 capsule by mouth daily.    . SENNOSIDES 8.6 MG PO TABS Oral Take 2 tablets (17.2 mg total) by mouth daily. 120 tablet 0  . SILVER SULFADIAZINE 1 % EX CREA Topical Apply 1 application topically daily.    . WARFARIN SODIUM 5 MG PO TABS Oral Take 1 tablet (5 mg total) by mouth one time only at 6 PM. 30 tablet 1    Dose to be adjusted by primary care MD.  . ZOLPIDEM TARTRATE 5 MG PO TABS Oral Take 5 mg by mouth at bedtime.      BP 130/85  Pulse 79  Temp 98.3  F (36.8 C) (Oral)  Resp 20  SpO2 98%  Physical Exam Gen: well developed and well nourished appearing Head: NCAT Eyes: PERL, EOMI Nose: no epistaixis or rhinorrhea Mouth/throat: mucosa is moist and pink Neck: supple, no stridor Lungs: CTA B, no wheezing, rhonchi or rales Abd: soft, notender, nondistended Back: no ttp, no cva ttp, Skin: no rash, no purpura or echymosis Ext: both lower legs are set in casts of  the lower leg and feet, no calf ttp. Neuro: CN ii-xii grossly intact, no focal deficits Psyche; normal affect,  calm and cooperative.   ED Course  Procedures (including critical care time)   Results for orders placed during the hospital encounter of 12/10/11 (from the past 24 hour(s))  CBC WITH DIFFERENTIAL     Status: Normal   Collection Time   12/11/11  1:08 AM      Component Value Range   WBC 9.8  4.0 - 10.5 K/uL   RBC 4.53  4.22 - 5.81 MIL/uL   Hemoglobin 13.5  13.0 - 17.0 g/dL   HCT 16.1  09.6 - 04.5 %   MCV 87.2  78.0 - 100.0 fL   MCH 29.8  26.0 - 34.0 pg   MCHC 34.2  30.0 - 36.0 g/dL   RDW 40.9  81.1 - 91.4 %   Platelets 233  150 - 400 K/uL   Neutrophils Relative 69  43 - 77 %   Neutro Abs 6.7  1.7 - 7.7 K/uL   Lymphocytes Relative 21  12 - 46 %   Lymphs Abs 2.1  0.7 - 4.0 K/uL   Monocytes Relative 7  3 - 12 %   Monocytes Absolute 0.7  0.1 - 1.0 K/uL   Eosinophils Relative 3  0 - 5 %   Eosinophils Absolute 0.2  0.0 - 0.7 K/uL   Basophils Relative 0  0 - 1 %   Basophils Absolute 0.0  0.0 - 0.1 K/uL  BASIC METABOLIC PANEL     Status: Abnormal   Collection Time   12/11/11  1:08 AM      Component Value Range   Sodium 137  135 - 145 mEq/L   Potassium 4.2  3.5 - 5.1 mEq/L   Chloride 102  96 - 112 mEq/L   CO2 26  19 - 32 mEq/L   Glucose, Bld 113 (*) 70 - 99 mg/dL   BUN 16  6 - 23 mg/dL   Creatinine, Ser 7.82  0.50 - 1.35 mg/dL   Calcium 9.5  8.4 - 95.6 mg/dL   GFR calc non Af Amer >90  >90 mL/min   GFR calc Af Amer >90  >90 mL/min  PROTIME-INR      Status: Abnormal   Collection Time   12/11/11  1:08 AM      Component Value Range   Prothrombin Time 25.3 (*) 11.6 - 15.2 seconds   INR 2.43 (*) 0.00 - 1.49  URINALYSIS, ROUTINE W REFLEX MICROSCOPIC     Status: Abnormal   Collection Time   12/11/11  1:24 AM      Component Value Range   Color, Urine AMBER (*) YELLOW   APPearance CLOUDY (*) CLEAR   Specific Gravity, Urine 1.023  1.005 - 1.030   pH 6.0  5.0 - 8.0   Glucose, UA NEGATIVE  NEGATIVE mg/dL   Hgb urine dipstick LARGE (*) NEGATIVE   Bilirubin Urine NEGATIVE  NEGATIVE   Ketones, ur NEGATIVE  NEGATIVE mg/dL   Protein, ur NEGATIVE  NEGATIVE mg/dL   Urobilinogen, UA 0.2  0.0 - 1.0 mg/dL   Nitrite NEGATIVE  NEGATIVE   Leukocytes, UA SMALL (*) NEGATIVE  URINE MICROSCOPIC-ADD ON     Status: Normal   Collection Time   12/11/11  1:24 AM      Component Value Range   Squamous Epithelial / LPF RARE  RARE   WBC, UA 3-6  <3 WBC/hpf   RBC / HPF  TOO NUMEROUS TO COUNT  <3 RBC/hpf   Bacteria, UA RARE  RARE         MDM  Patient is therapeutic on Coumadin with INR of 2.4.  Urinalysis is suggestive of possible UTI. We will send for cx and tx empirically with Keflex.  Patient will schedule a follow up appt with his urologist.  Hematuria has resolved here in the ED without intervention. He is counseled to return to the ED for fever, worsening sx or any urgent health concerns.         Brandt Loosen, MD 12/11/11 0330

## 2011-12-12 LAB — URINE CULTURE

## 2011-12-13 NOTE — ED Notes (Signed)
+  Urine. Patient given Keflex. No sensitivities listed. Chart sent to EDP office for review. °

## 2011-12-15 NOTE — ED Notes (Signed)
Chart returned from EDP office . No treatment necessary per Lovenia Kim

## 2012-01-25 ENCOUNTER — Telehealth: Payer: Self-pay | Admitting: Internal Medicine

## 2012-01-25 NOTE — Telephone Encounter (Signed)
Pt states he has been having a sour-bitter taste in his mouth for about a year along with abdominal pain. States his stomach hurts in the mornings. Pt had EGD and colon in September. Pt scheduled to see Dr. Marina Goodell 02/01/12@9 :15am. Pt aware of appt date and time.

## 2012-01-29 ENCOUNTER — Encounter (HOSPITAL_COMMUNITY): Payer: Self-pay | Admitting: Emergency Medicine

## 2012-01-29 ENCOUNTER — Emergency Department (HOSPITAL_COMMUNITY)
Admission: EM | Admit: 2012-01-29 | Discharge: 2012-01-29 | Disposition: A | Discharge status: Home / Self Care | Payer: PRIVATE HEALTH INSURANCE | Attending: Emergency Medicine | Admitting: Emergency Medicine

## 2012-01-29 DIAGNOSIS — R1013 Epigastric pain: Secondary | ICD-10-CM | POA: Insufficient documentation

## 2012-01-29 DIAGNOSIS — Z87442 Personal history of urinary calculi: Secondary | ICD-10-CM | POA: Insufficient documentation

## 2012-01-29 DIAGNOSIS — I509 Heart failure, unspecified: Secondary | ICD-10-CM | POA: Insufficient documentation

## 2012-01-29 DIAGNOSIS — R42 Dizziness and giddiness: Secondary | ICD-10-CM | POA: Insufficient documentation

## 2012-01-29 DIAGNOSIS — R61 Generalized hyperhidrosis: Secondary | ICD-10-CM | POA: Insufficient documentation

## 2012-01-29 DIAGNOSIS — I252 Old myocardial infarction: Secondary | ICD-10-CM | POA: Insufficient documentation

## 2012-01-29 DIAGNOSIS — Z9861 Coronary angioplasty status: Secondary | ICD-10-CM | POA: Insufficient documentation

## 2012-01-29 DIAGNOSIS — Z8781 Personal history of (healed) traumatic fracture: Secondary | ICD-10-CM | POA: Insufficient documentation

## 2012-01-29 DIAGNOSIS — Z860100 Personal history of colon polyps, unspecified: Secondary | ICD-10-CM | POA: Insufficient documentation

## 2012-01-29 DIAGNOSIS — Z8659 Personal history of other mental and behavioral disorders: Secondary | ICD-10-CM | POA: Insufficient documentation

## 2012-01-29 DIAGNOSIS — Z8719 Personal history of other diseases of the digestive system: Secondary | ICD-10-CM | POA: Insufficient documentation

## 2012-01-29 DIAGNOSIS — E785 Hyperlipidemia, unspecified: Secondary | ICD-10-CM | POA: Insufficient documentation

## 2012-01-29 DIAGNOSIS — Z79899 Other long term (current) drug therapy: Secondary | ICD-10-CM | POA: Insufficient documentation

## 2012-01-29 DIAGNOSIS — G8929 Other chronic pain: Secondary | ICD-10-CM | POA: Insufficient documentation

## 2012-01-29 DIAGNOSIS — Z9889 Other specified postprocedural states: Secondary | ICD-10-CM | POA: Insufficient documentation

## 2012-01-29 DIAGNOSIS — Z8711 Personal history of peptic ulcer disease: Secondary | ICD-10-CM | POA: Insufficient documentation

## 2012-01-29 DIAGNOSIS — Z8739 Personal history of other diseases of the musculoskeletal system and connective tissue: Secondary | ICD-10-CM | POA: Insufficient documentation

## 2012-01-29 DIAGNOSIS — Z7982 Long term (current) use of aspirin: Secondary | ICD-10-CM | POA: Insufficient documentation

## 2012-01-29 DIAGNOSIS — Z872 Personal history of diseases of the skin and subcutaneous tissue: Secondary | ICD-10-CM | POA: Insufficient documentation

## 2012-01-29 DIAGNOSIS — I2510 Atherosclerotic heart disease of native coronary artery without angina pectoris: Secondary | ICD-10-CM | POA: Insufficient documentation

## 2012-01-29 DIAGNOSIS — R110 Nausea: Secondary | ICD-10-CM | POA: Insufficient documentation

## 2012-01-29 DIAGNOSIS — Z87891 Personal history of nicotine dependence: Secondary | ICD-10-CM | POA: Insufficient documentation

## 2012-01-29 LAB — CBC WITH DIFFERENTIAL/PLATELET
HCT: 45.7 % (ref 39.0–52.0)
Hemoglobin: 16.3 g/dL (ref 13.0–17.0)
Lymphocytes Relative: 20 % (ref 12–46)
MCHC: 35.7 g/dL (ref 30.0–36.0)
Monocytes Absolute: 0.5 10*3/uL (ref 0.1–1.0)
Monocytes Relative: 6 % (ref 3–12)
Neutro Abs: 6.4 10*3/uL (ref 1.7–7.7)
WBC: 8.8 10*3/uL (ref 4.0–10.5)

## 2012-01-29 LAB — COMPREHENSIVE METABOLIC PANEL
BUN: 10 mg/dL (ref 6–23)
CO2: 23 mEq/L (ref 19–32)
Chloride: 102 mEq/L (ref 96–112)
Creatinine, Ser: 0.74 mg/dL (ref 0.50–1.35)
GFR calc non Af Amer: >90 mL/min (ref >90)
Glucose, Bld: 121 mg/dL — ABNORMAL HIGH (ref 70–99)
Total Bilirubin: 0.5 mg/dL (ref 0.3–1.2)

## 2012-01-29 LAB — LIPASE, BLOOD: Lipase: 25 U/L (ref 11–59)

## 2012-01-29 MED ORDER — GI COCKTAIL ~~LOC~~
30.0000 mL | Freq: Once | ORAL | Status: AC
  Administered 2012-01-29: 30 mL via ORAL
  Filled 2012-01-29: qty 30

## 2012-01-29 MED ORDER — SUCRALFATE 1 G PO TABS: 1.0000 g | ORAL_TABLET | Freq: Four times a day (QID) | ORAL | Status: DC

## 2012-01-29 NOTE — ED Notes (Signed)
Went to dr and was having his boot removed and he stood up and passed out and got sweaty, having abd pain all over  Has a bad h/a also

## 2012-01-29 NOTE — ED Notes (Signed)
Nausea off and on for a while x 3 months before he broke his leg and fell, has had upper and lower GI

## 2012-01-29 NOTE — ED Provider Notes (Signed)
History  This chart was scribed for Larry Bucco, MD by Shari Heritage, ED Scribe. The patient was seen in room TR10C/TR10C. Patient's care was started at 1533.  CSN: [id number]  Arrival date & time 01/29/12  1303   First MD Initiated Contact with Patient 01/29/12 1533      Chief Complaint  Patient presents with  . Abdominal Pain  . Nausea     The history is provided by the patient. No language interpreter was used.   HPI Comments: DOMINICO Randall is a 61 y.o. male who presents to the Emergency Department complaining of persistent nausea that patient states has been present for 2-3 years. He says that he had a severe episode of nausea earlier today. Patient had an appointment with his orthopedist today and started to feel faint and nauseated after standing up. There was associated lightheadedness and diaphoresis. States that this happened when he stood up in the orthopedist office for the first time without his post op boots and had intense pain to feet from his fractures.  Patient is also reporting persistent, intermittent, burning, epigastric abdominal pain onset 3-4 months ago. Patient states that he has an appointment with a gastroenterologist on Monday and has already had an upper and lower GI done. Patient says he was also told that he has a lot of gas in his stomach. Patient denies chest pain, SOB, cough, rhinorrhea, congestion, hematemesis, or difficulty urinaiting. Patient has a medical history of coronary artery disease, CHF, gastric ulcer, hiatal hernia, kidney stones, hyperlipidemia, and GERD. His surgical history includes coronary angioplasty with stent (1996). He is a former smoker (quit date: 09/02/1994).  Gastroentrologist - Marina Goodell  Past Medical History  Diagnosis Date  . Anxiety   . Arthritis   . GERD (gastroesophageal reflux disease)   . Hyperlipemia   . CAD (coronary artery disease) FOLLOWED PCP  DR Barnetta Chapel  [facility])    PT DENIES CARDIAC SYMPTOMS   . History of MI (myocardial infarction) 1996--  S/P ANGIOPLASTY X3 STENTS  . S/P primary angioplasty with coronary stent X3 STENTS  1996  POST MI    PER PT BARE METAL STENTS  . H/O hiatal hernia   . History of CHF (congestive heart failure)   . History of gastric ulcer   . Atypical rash OF MOUTH -- PER PT UNKNOWN ETIOLOGY    RX MOUTHWASH BID-  PT STATES POSSIBLE FROM GERD  . History of kidney stones   . Depression   . Colon polyp     Past Surgical History  Procedure Date  . Tonsillectomy and adenoidectomy CHILD  . Extracorporeal shock wave lithotripsy 05-02-2009  . Transurethral resection of prostate X2   YRS AGO    BEFORE 1996  . Coronary angioplasty with stent placement 1996    PER PT X3 STENTS (BARE METAL)  . Transurethral resection of prostate 09/07/2011    Procedure: TRANSURETHRAL RESECTION OF THE PROSTATE WITH GYRUS INSTRUMENTS;  Surgeon: Marcine Matar, MD;  Location: [facility];  Service: Urology;  Laterality: N/A;  2 HRS NEEDS A BED  gyrus loop       . External fixation leg 11/09/2011    Procedure: EXTERNAL FIXATION LEG;  Surgeon: Toni Arthurs, MD;  Location: [facility] OR;  Service: Orthopedics;  Laterality: Left;  . Orif calcaneous fracture 11/12/2011    Procedure: OPEN REDUCTION INTERNAL FIXATION (ORIF) CALCANEOUS FRACTURE;  Surgeon: Toni Arthurs, MD;  Location: [facility] OR;  Service: Orthopedics;  Laterality: Right;  ORIF RIGHT  CALCANEOUS FRACTURE  . Orif tibia fracture 11/17/2011    Procedure: OPEN REDUCTION INTERNAL FIXATION (ORIF) TIBIA FRACTURE;  Surgeon: Toni Arthurs, MD;  Location: [facility] OR;  Service: Orthopedics;  Laterality: Left;  REMOVAL OF EXFIX, ORIF LEFT TIBIAL PILON FRACTURE    Family History  Problem Relation Age of Onset  . Heart disease Father   . Colon cancer Neg Hx   . Esophageal cancer Neg Hx   . Rectal cancer Neg Hx   . Stomach cancer Neg Hx     History  Substance Use Topics  . Smoking status: Former Smoker -- 1.0 packs/day for 30 years     Types: Cigarettes    Quit date: 09/02/1994  . Smokeless tobacco: Never Used  . Alcohol Use: No      Review of Systems  Constitutional: Positive for diaphoresis. Negative for fever, chills and fatigue.  HENT: Negative for congestion, rhinorrhea and sneezing.   Eyes: Negative.   Respiratory: Negative for cough, chest tightness and shortness of breath.   Cardiovascular: Negative for chest pain and leg swelling.  Gastrointestinal: Positive for nausea and abdominal pain. Negative for vomiting, diarrhea and blood in stool.  Genitourinary: Negative for frequency, hematuria, flank pain and difficulty urinating.  Musculoskeletal: Negative for back pain and arthralgias.  Skin: Negative for rash.  Neurological: Positive for light-headedness. Negative for dizziness, speech difficulty, weakness, numbness and headaches.    Allergies  Review of patient's allergies indicates no known allergies.  Home Medications   Current Outpatient Rx  Name  Route  Sig  Dispense  Refill  . ASPIRIN EC 325 MG PO TBEC   Oral   Take 325 mg by mouth daily.         Marland Kitchen CALCIUM-MAGNESIUM-VITAMIN D PO   Oral   Take 1 tablet by mouth daily.         Marland Kitchen HYDROCODONE-ACETAMINOPHEN 7.5-325 MG PO TABS   Oral   Take 0.5 tablets by mouth every 6 (six) hours as needed. For pain         . HYDROCODONE-ACETAMINOPHEN 5-325 MG PO TABS   Oral   Take 1 tablet by mouth every 8 (eight) hours as needed. For pain         . DIGESTIVE HEALTH PROBIOTIC PO CAPS   Oral   Take 1 tablet by mouth daily.         Bernadette Hoit SODIUM 8.6-50 MG PO TABS   Oral   Take 1 tablet by mouth daily as needed. For constipation         . SUCRALFATE 1 G PO TABS   Oral   Take 1 tablet (1 g total) by mouth 4 (four) times daily.   20 tablet   0     Triage Vitals: BP 131/80  Pulse 82  Temp 97.5 F (36.4 C)  Resp 16  SpO2 98%  Physical Exam  Constitutional: He is oriented to person, place, and time. He appears  well-developed and well-nourished.  HENT:  Head: Normocephalic and atraumatic.  Eyes: Pupils are equal, round, and reactive to light.  Neck: Normal range of motion. Neck supple.  Cardiovascular: Normal rate, regular rhythm and normal heart sounds.   Pulmonary/Chest: Effort normal and breath sounds normal. No respiratory distress. He has no wheezes. He has no rales. He exhibits no tenderness.  Abdominal: Soft. Bowel sounds are normal. There is tenderness in the epigastric area. There is no rebound and no guarding.  Musculoskeletal: Normal range of motion. He exhibits no edema.  Lymphadenopathy:    He has no cervical adenopathy.  Neurological: He is alert and oriented to person, place, and time.  Skin: Skin is warm and dry. No rash noted.  Psychiatric: He has a normal mood and affect.    ED Course  Procedures (including critical care time) DIAGNOSTIC STUDIES: Oxygen Saturation is 98% on room air, normal by my interpretation.    COORDINATION OF CARE: 3:56 PM- Patient informed of current plan for treatment and evaluation and agrees with plan at this time.  Results for orders placed during the hospital encounter of 01/29/12  CBC WITH DIFFERENTIAL      Component Value Range   WBC 8.8  4.0 - 10.5 K/uL   RBC 5.46  4.22 - 5.81 MIL/uL   Hemoglobin 16.3  13.0 - 17.0 g/dL   HCT 95.2  84.1 - 32.4 %   MCV 83.7  78.0 - 100.0 fL   MCH 29.9  26.0 - 34.0 pg   MCHC 35.7  30.0 - 36.0 g/dL   RDW 40.1  02.7 - 25.3 %   Platelets 375  150 - 400 K/uL   Neutrophils Relative 73  43 - 77 %   Neutro Abs 6.4  1.7 - 7.7 K/uL   Lymphocytes Relative 20  12 - 46 %   Lymphs Abs 1.8  0.7 - 4.0 K/uL   Monocytes Relative 6  3 - 12 %   Monocytes Absolute 0.5  0.1 - 1.0 K/uL   Eosinophils Relative 1  0 - 5 %   Eosinophils Absolute 0.1  0.0 - 0.7 K/uL   Basophils Relative 1  0 - 1 %   Basophils Absolute 0.1  0.0 - 0.1 K/uL  COMPREHENSIVE METABOLIC PANEL      Component Value Range   Sodium 140  135 - 145 mEq/L    Potassium 4.0  3.5 - 5.1 mEq/L   Chloride 102  96 - 112 mEq/L   CO2 23  19 - 32 mEq/L   Glucose, Bld 121 (*) 70 - 99 mg/dL   BUN 10  6 - 23 mg/dL   Creatinine, Ser 6.64  0.50 - 1.35 mg/dL   Calcium 40.3  8.4 - 47.4 mg/dL   Total Protein 7.9  6.0 - 8.3 g/dL   Albumin 4.3  3.5 - 5.2 g/dL   AST 17  0 - 37 U/L   ALT 13  0 - 53 U/L   Alkaline Phosphatase 149 (*) 39 - 117 U/L   Total Bilirubin 0.5  0.3 - 1.2 mg/dL   GFR calc non Af Amer >90  >90 mL/min   GFR calc Af Amer >90  >90 mL/min  LIPASE, BLOOD      Component Value Range   Lipase 25  11 - 59 U/L    No results found.   1. Epigastric pain       MDM  Patient presents with nausea and epigastric pain appears fairly chronic. He has appointment to see Dr. Marina Goodell on Monday. He was previously taking Nexium which he was told to stop by an ENT because they did not see any evidence of reflux. He also states the Nexium did not really help. He denies any new type symptoms. Denies any chest pain, shortness of breath or anything that sounds related to acute coronary syndrome. He was given a GI cocktail here with improvement of symptoms. His labs look okay. Was advised to followup with Dr. Marina Goodell on Monday or return here as needed for any  worsening symptoms he was given a prescription for Carafate to try.      I personally performed the services described in this documentation, which was scribed in my presence.  The recorded information has been reviewed and considered.   Larry Bucco, MD 01/29/12 [phone]

## 2012-02-01 ENCOUNTER — Ambulatory Visit (INDEPENDENT_AMBULATORY_CARE_PROVIDER_SITE_OTHER): Payer: PRIVATE HEALTH INSURANCE | Admitting: Internal Medicine

## 2012-02-01 ENCOUNTER — Encounter: Payer: Self-pay | Admitting: Internal Medicine

## 2012-02-01 DIAGNOSIS — R110 Nausea: Secondary | ICD-10-CM

## 2012-02-01 DIAGNOSIS — R141 Gas pain: Secondary | ICD-10-CM

## 2012-02-01 DIAGNOSIS — K219 Gastro-esophageal reflux disease without esophagitis: Secondary | ICD-10-CM

## 2012-02-01 DIAGNOSIS — R1084 Generalized abdominal pain: Secondary | ICD-10-CM

## 2012-02-01 MED ORDER — ONDANSETRON HCL 4 MG PO TABS: 4.0000 mg | ORAL_TABLET | ORAL | Status: DC | PRN

## 2012-02-01 MED ORDER — ALIGN PO CAPS: 1.0000 | ORAL_CAPSULE | Freq: Every day | ORAL | Status: DC

## 2012-02-01 NOTE — Patient Instructions (Addendum)
We have sent the following medications to your pharmacy for you to pick up at your convenience: Zofran  We have given you samples of Align. This puts good bacteria back into your colon. You should take 1 capsule by mouth once daily. If this works well for you, it can be purchased over the counter.  We have given you some information on gas to read.

## 2012-02-01 NOTE — Progress Notes (Signed)
HISTORY OF PRESENT ILLNESS:  Larry Randall is a 61 y.o. male with multiple medical problems as listed below. He was evaluated 08/12/2011 regarding a bitter taste in his mouth (burning sensation in the nose and mouth) and generalized abdominal discomfort. See that dictation for details. Other complaints included dysphagia and increased intestinal gas. He subsequently underwent screening colonoscopy and upper endoscopy with esophageal dilation on 10/29/2011. A diminutive colon polyp was removed and moderate diverticulosis present. Upper endoscopy was normal but he was empirically dilated with a 54 [place] dilator. He presents today with his brother-in-law, Larry Randall. Patient complains of increased intestinal gas, abdominal discomfort, and burning sensation in his tongue, as previous. He states that the symptoms have been going on for some time. No vomiting or weight loss. No dysphagia. No active reflux symptoms. He was seen the other day in the emergency room for complaints of abdominal discomfort. Negative evaluation. Prescribe liquid Carafate, which he thinks helps. He has had bilateral foot surgery after traumatic fall. He does report some GI intolerance to narcotic pain medication. He is not taking a PPI, though has a prior history of GERD.  REVIEW OF SYSTEMS:  All non-GI ROS negative except for  Past Medical History  Diagnosis Date  . Anxiety   . Arthritis   . GERD (gastroesophageal reflux disease)   . Hyperlipemia   . CAD (coronary artery disease) FOLLOWED PCP  DR Barnetta Chapel  [facility])    PT DENIES CARDIAC SYMPTOMS  . History of MI (myocardial infarction) 1996--  S/P ANGIOPLASTY X3 STENTS  . S/P primary angioplasty with coronary stent X3 STENTS  1996  POST MI    PER PT BARE METAL STENTS  . H/O hiatal hernia   . History of CHF (congestive heart failure)   . History of gastric ulcer   . Atypical rash OF MOUTH -- PER PT UNKNOWN ETIOLOGY    RX MOUTHWASH BID-   PT STATES POSSIBLE FROM GERD  . History of kidney stones   . Depression   . Colon polyp     Past Surgical History  Procedure Date  . Tonsillectomy and adenoidectomy CHILD  . Extracorporeal shock wave lithotripsy 05-02-2009  . Transurethral resection of prostate X2   YRS AGO    BEFORE 1996  . Coronary angioplasty with stent placement 1996    PER PT X3 STENTS (BARE METAL)  . Transurethral resection of prostate 09/07/2011    Procedure: TRANSURETHRAL RESECTION OF THE PROSTATE WITH GYRUS INSTRUMENTS;  Surgeon: Marcine Matar, MD;  Location: [facility];  Service: Urology;  Laterality: N/A;  2 HRS NEEDS A BED  gyrus loop       . External fixation leg 11/09/2011    Procedure: EXTERNAL FIXATION LEG;  Surgeon: Toni Arthurs, MD;  Location: [facility] OR;  Service: Orthopedics;  Laterality: Left;  . Orif calcaneous fracture 11/12/2011    Procedure: OPEN REDUCTION INTERNAL FIXATION (ORIF) CALCANEOUS FRACTURE;  Surgeon: Toni Arthurs, MD;  Location: [facility] OR;  Service: Orthopedics;  Laterality: Right;  ORIF RIGHT CALCANEOUS FRACTURE  . Orif tibia fracture 11/17/2011    Procedure: OPEN REDUCTION INTERNAL FIXATION (ORIF) TIBIA FRACTURE;  Surgeon: Toni Arthurs, MD;  Location: [facility] OR;  Service: Orthopedics;  Laterality: Left;  REMOVAL OF EXFIX, ORIF LEFT TIBIAL PILON FRACTURE    Social History Larry Randall  reports that he quit smoking about 17 years ago. His smoking use included Cigarettes. He has a 30 pack-year smoking history. He has never used smokeless  tobacco. He reports that he does not drink alcohol or use illicit drugs.  family history includes Heart disease in his father.  There is no history of Colon cancer, and Esophageal cancer, and Rectal cancer, and Stomach cancer, .  No Known Allergies     PHYSICAL EXAMINATION: Vital signs: There were no vitals taken for this visit. General: Well-developed, well-nourished, no acute distress HEENT: Sclerae are anicteric, conjunctiva pink.  Oral mucosa intact Lungs: Clear Heart: Regular Abdomen: soft,with complaints of tenderness to minimal palpation throughout, nondistended, no obvious ascites, no peritoneal signs, normal bowel sounds. No organomegaly. Extremities:bilateral protective orthopedic boots Psychiatric: alert and oriented x3. Cooperative    ASSESSMENT:  #1. Chronic Abdominal pain. Likely functional #2. History of GERD #3. Chronic nausea #4. Increased intestinal gas.   PLAN:  #1. Anti-gas and flatulence dietary sheet #2. Educational information on gas provided #3. Zofran 4 mg every 4 hours as needed prescribed for nausea #4. Four-week samples of probiotic Align provided. This for increased intestinal gas. #5. Screening colonoscopy. Up-to-date. Routine followup 10 years #6. Resume general medical care with PCP. GI followup as needed

## 2012-02-19 ENCOUNTER — Ambulatory Visit
Admission: RE | Admit: 2012-02-19 | Discharge: 2012-02-19 | Disposition: A | Discharge status: Home / Self Care | Payer: PRIVATE HEALTH INSURANCE | Source: Ambulatory Visit | Attending: Otolaryngology | Admitting: Otolaryngology

## 2012-02-19 ENCOUNTER — Other Ambulatory Visit: Payer: Self-pay | Admitting: Otolaryngology

## 2012-02-19 DIAGNOSIS — K146 Glossodynia: Secondary | ICD-10-CM

## 2012-03-11 ENCOUNTER — Encounter: Payer: Self-pay | Admitting: Physical Medicine & Rehabilitation

## 2012-03-29 ENCOUNTER — Encounter: Payer: Worker's Compensation | Attending: Physical Medicine & Rehabilitation

## 2012-03-29 ENCOUNTER — Encounter: Payer: Self-pay | Admitting: Physical Medicine & Rehabilitation

## 2012-03-29 ENCOUNTER — Ambulatory Visit (HOSPITAL_BASED_OUTPATIENT_CLINIC_OR_DEPARTMENT_OTHER): Payer: Worker's Compensation | Admitting: Physical Medicine & Rehabilitation

## 2012-03-29 VITALS — BP 165/93 | HR 79 | Resp 14 | Ht 71.0 in | Wt 198.0 lb

## 2012-03-29 DIAGNOSIS — G90529 Complex regional pain syndrome I of unspecified lower limb: Secondary | ICD-10-CM | POA: Insufficient documentation

## 2012-03-29 DIAGNOSIS — M25579 Pain in unspecified ankle and joints of unspecified foot: Secondary | ICD-10-CM

## 2012-03-29 DIAGNOSIS — I252 Old myocardial infarction: Secondary | ICD-10-CM | POA: Insufficient documentation

## 2012-03-29 DIAGNOSIS — E785 Hyperlipidemia, unspecified: Secondary | ICD-10-CM | POA: Insufficient documentation

## 2012-03-29 DIAGNOSIS — K219 Gastro-esophageal reflux disease without esophagitis: Secondary | ICD-10-CM | POA: Insufficient documentation

## 2012-03-29 DIAGNOSIS — G8921 Chronic pain due to trauma: Secondary | ICD-10-CM

## 2012-03-29 DIAGNOSIS — Z5181 Encounter for therapeutic drug level monitoring: Secondary | ICD-10-CM

## 2012-03-29 DIAGNOSIS — I2510 Atherosclerotic heart disease of native coronary artery without angina pectoris: Secondary | ICD-10-CM | POA: Insufficient documentation

## 2012-03-29 HISTORY — DX: Complex regional pain syndrome i of unspecified lower limb: G90.529

## 2012-03-29 HISTORY — DX: Chronic pain due to trauma: G89.21

## 2012-03-29 MED ORDER — GABAPENTIN 100 MG PO CAPS
100.0000 mg | ORAL_CAPSULE | Freq: Three times a day (TID) | ORAL | Status: DC
Start: 2012-03-29 — End: 2012-04-12

## 2012-03-29 MED ORDER — PREDNISONE (PAK) 10 MG PO TABS: 10.0000 mg | ORAL_TABLET | Freq: Every day | ORAL | Status: DC

## 2012-03-29 NOTE — Patient Instructions (Signed)
You'll take the prednisone burst and taper the instructions will be on the bottle Gabapentin 100 mg 3 times a day. This is a very small dose. It may cause drowsiness. We may need to increase the dose next time I see Work restrictions sedentary duty only Continued physical therapy- Ask to do Desensitization We discussed Complex Regional Pain Syndrome=RSD Complex Regional Pain Syndrome (CRPS) is a nerve disorder that occurs at the site of an injury. It occurs especially after injuries from high-velocity impacts such as those from bullets or shrapnel. However, it may occur without apparent injury. The arms or legs are usually involved. SYMPTOMS  CRPS is a chronic condition characterized by:  Severe burning pain.  Changes in bone and skin.  Excessive sweating.  Tissue swelling.  Extreme sensitivity to touch. One visible sign of CRPS near the site of injury is warm, shiny, red skin that later becomes cool and bluish. The pain that patients report is out of proportion to the severity of the injury. The pain gets worse, rather than better, over time. Eventually the joints become stiff from disuse and the skin, muscles, and bone atrophy. The symptoms of CRPS vary in severity and duration. The cause of CRPS is unknown. The disorder is unique in that it simultaneously affects the:  Nerves.  Skin.  Muscles.  Blood vessels.  Bones. CRPS can strike at any age but is more common between the ages of 49 and 23. However, the number of CRPS cases among adolescents and young adults is increasing. CRPS is diagnosed primarily through observation of the symptoms. Some physicians use thermography to detect changes in body temperature that are common in CRPS. X-rays may also show changes in the bone.  TREATMENT  Treatments include:  Physicians use a variety of drugs to treat CRPS.  Elevation of the extremity and physical therapy are also used to treat CRPS.  Injection of a local anesthetic.  Applying  brief pulses of electricity to nerve endings under the skin (transcutaneous electrical stimulation or TENS).  In some cases, interruption of the affected portion of the sympathetic nervous system (surgical or chemical sympathectomy) is necessary to relieve pain. This involves cutting the nerve or nerves. Pain is destroyed almost instantly, but this treatment may also destroy other sensations. PROGNOSIS Good progress can be made in treating CRPS if treatment is begun early. Ideally treatment should begin within 3 months of the first symptoms. Early treatment often results in remission. If treatment is delayed, the disorder can quickly spread to the entire limb, and changes in bone and muscle may become irreversible. In 50 percent of CRPS cases, pain persists longer than 6 months and sometimes for years. Document Released: 01/23/2002 Document Revised: 04/27/2011 Document Reviewed: 12/14/2007 [facility] Patient Information 2013 [place], [place].  diagnosis of reflex sympathetic dystrophy

## 2012-03-29 NOTE — Progress Notes (Signed)
Subjective:    Patient ID: Larry Randall, male    DOB: 02/21/1950, 62 y.o.   MRN: [id number] DOI:  11/08/2011 Fall off ladder while cleaning off ash bin, minor burns. 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 Required open reduction internal fixation with lateral plate and screws of the right calcaneal fracture. There was a lateral plate and screws of the left tibial fracture. Did not require inpatient rehabilitation. Received home health physical therapy and is now in outpatient physical therapy. Doing aquatic therapy with water treadmill walking. He is currently walking 15 minutes forward and vitamins backwards. She is out of the [employer]. Reviewed orthopedic notes. Evidence of good fracture healing. No hardware complications. Has been taking hydrocodone 1 or 2 tablets per day. Takes one or 2 Baby aspirin a week.Also takes Aleve 2 tablets every 2-3 days left ankle is feeling better however the right foot and ankle are still very painful. Feels like he is standing on a 2 x 4 with his right lateral foot. Performs home exercise program with Achilles stretching. Ankle and foot rotations. Ankle and foot pump exercises. Has not tried other medicines other than hydrocodone. Past medical history significant for heart disease status post stent History of hypertension. Pain Inventory Average Pain 7 Pain Right Now 7 My pain is sharp and burning  In the last 24 hours, has pain interfered with the following? General activity 7 Relation with others 7 Enjoyment of life 7 What TIME of day is your pain at its worst? all Sleep (in general) Fair  Pain is worse with: walking and standing Pain improves with: rest and medication Relief from Meds: 5  Mobility how many minutes can you walk? 15 ability to climb steps?  yes do you drive?  yes  Function employed # of hrs/week wkman comp I need  assistance with the following:  shopping  Neuro/Psych trouble walking depression anxiety  Prior Studies Any changes since last visit?  no  Physicians involved in your care Any changes since last visit?  no   Family History  Problem Relation Age of Onset  . Heart disease Father   . Colon cancer Neg Hx   . Esophageal cancer Neg Hx   . Rectal cancer Neg Hx   . Stomach cancer Neg Hx    History   Social History  . Marital Status: Divorced    Spouse Name: N/A    Number of Children: 3  . Years of Education: N/A   Social History Main Topics  . Smoking status: Former Smoker -- 1.00 packs/day for 30 years    Types: Cigarettes    Quit date: 09/02/1994  . Smokeless tobacco: Never Used  . Alcohol Use: No  . Drug Use: No  . Sexually Active: None   Other Topics Concern  . None   Social History Narrative  . None   Past Surgical History  Procedure Laterality Date  . Tonsillectomy and adenoidectomy  CHILD  . Extracorporeal shock wave lithotripsy  05-02-2009  . Transurethral resection of prostate  X2   YRS AGO    BEFORE 1996  . Coronary angioplasty with stent placement  1996    PER PT X3 STENTS (BARE METAL)  . Transurethral resection of prostate  09/07/2011    Procedure: TRANSURETHRAL RESECTION OF THE PROSTATE WITH GYRUS INSTRUMENTS;  Surgeon: Marcine Matar, MD;  Location: [facility];  Service: Urology;  Laterality: N/A;  2 HRS NEEDS A BED  gyrus loop       . External fixation leg  11/09/2011    Procedure: EXTERNAL FIXATION LEG;  Surgeon: Toni Arthurs, MD;  Location: [facility] OR;  Service: Orthopedics;  Laterality: Left;  . Orif calcaneous fracture  11/12/2011    Procedure: OPEN REDUCTION INTERNAL FIXATION (ORIF) CALCANEOUS FRACTURE;  Surgeon: Toni Arthurs, MD;  Location: [facility] OR;  Service: Orthopedics;  Laterality: Right;  ORIF RIGHT CALCANEOUS FRACTURE  . Orif tibia fracture  11/17/2011    Procedure: OPEN REDUCTION INTERNAL FIXATION (ORIF) TIBIA FRACTURE;   Surgeon: Toni Arthurs, MD;  Location: [facility] OR;  Service: Orthopedics;  Laterality: Left;  REMOVAL OF EXFIX, ORIF LEFT TIBIAL PILON FRACTURE   Past Medical History  Diagnosis Date  . Anxiety   . Arthritis   . GERD (gastroesophageal reflux disease)   . Hyperlipemia   . CAD (coronary artery disease) FOLLOWED PCP  DR Barnetta Chapel  [facility])    PT DENIES CARDIAC SYMPTOMS  . History of MI (myocardial infarction) 1996--  S/P ANGIOPLASTY X3 STENTS  . S/P primary angioplasty with coronary stent X3 STENTS  1996  POST MI    PER PT BARE METAL STENTS  . H/O hiatal hernia   . History of CHF (congestive heart failure)   . History of gastric ulcer   . Atypical rash OF MOUTH -- PER PT UNKNOWN ETIOLOGY    RX MOUTHWASH BID-  PT STATES POSSIBLE FROM GERD  . History of kidney stones   . Depression   . Colon polyp    BP 165/93  Pulse 79  Resp 14  Ht 5\' 11"  (1.803 m)  Wt 198 lb (89.812 kg)  BMI 27.63 kg/m2  SpO2 95%    Review of Systems  Musculoskeletal: Positive for gait problem.       Foot pain  Psychiatric/Behavioral: Positive for dysphoric mood. The patient is nervous/anxious.   All other systems reviewed and are negative.       Objective:   Physical Exam  Nursing note and vitals reviewed. Constitutional: He is oriented to person, place, and time. He appears well-developed and well-nourished.  HENT:  Head: Normocephalic and atraumatic.  Eyes: EOM are normal. Pupils are equal, round, and reactive to light.  Neck: Normal range of motion.  Musculoskeletal:       Right ankle: He exhibits swelling. Tenderness. Lateral malleolus tenderness found. Achilles tendon normal.       Left ankle: He exhibits swelling. Tenderness. Lateral malleolus tenderness found. Achilles tendon normal.       Lumbar back: Normal.  Neurological: He is alert and oriented to person, place, and time. He has normal strength and normal reflexes. A sensory deficit is present. Gait abnormal.  Psychiatric:  He has a normal mood and affect.  83.5deg F Right 80.8 deg F left R L5,S1 decreased pinprick  Range of motion and strength is normal in both foot and ankle Gait- antalgic favoring RLE, Lateral ankle pain with ambulation       Assessment & Plan:  1.  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 Possible RSD LLE Using IASP criteria listed below, Pt symptoms/signs are bolded  must have reports of at least 1 symptom in 3 of 4 categories  sensory - allodynia and/or hyperesthesia Hypoesthesia vasomotor - temperature asymmetry and/or skin color changes and/or skin color asymmetry  sudomotor/edema - edema and/or sweating changes and/or sweating asymmetry  motor/trophic - decreased range of motion and/or  motor dysfunction (weakness, tremor, dystonia) and/or trophic changes (in hair, nails, or skin) must have at least 1 sign at time of evaluation in 2 or more categories  sensory - allodynia (to light touch and/or temperature and/or deep somatic pressure and/or joint movement) and/or hyperalgesia (to pinprick)  vasomotor - evidence of temperature asymmetry (> 1 degree C [1.8 degrees F]) and/or skin color changes and/or skin color asymmetry  sudomotor/edema -evidence of edema and/or sweating changes and/or sweating asymmetry  motor/trophic - evidence of decreased range of motion and/or motor dysfunction (weakness, tremor, dystonia) and/or trophic changes (in hair, nails, or skin)  Recommend prednisone burst and taper, gabapentin 100 mg 3 times a day, continue physical therapy. Sedentary work restrictions Patient is not at [facility] Consider lumbar sympathetic block right L3-L4

## 2012-04-12 ENCOUNTER — Ambulatory Visit (HOSPITAL_BASED_OUTPATIENT_CLINIC_OR_DEPARTMENT_OTHER): Payer: Worker's Compensation | Admitting: Physical Medicine & Rehabilitation

## 2012-04-12 ENCOUNTER — Encounter: Payer: Self-pay | Admitting: Physical Medicine & Rehabilitation

## 2012-04-12 VITALS — BP 143/88 | HR 83 | Resp 14 | Ht 71.0 in | Wt 194.0 lb

## 2012-04-12 DIAGNOSIS — G90521 Complex regional pain syndrome I of right lower limb: Secondary | ICD-10-CM

## 2012-04-12 DIAGNOSIS — G90529 Complex regional pain syndrome I of unspecified lower limb: Secondary | ICD-10-CM

## 2012-04-12 DIAGNOSIS — G8921 Chronic pain due to trauma: Secondary | ICD-10-CM

## 2012-04-12 MED ORDER — GABAPENTIN 100 MG PO CAPS: 300.0000 mg | ORAL_CAPSULE | Freq: Three times a day (TID) | ORAL | Status: DC

## 2012-04-12 NOTE — Patient Instructions (Signed)
Sedentary work restrictions Tibial nerve block right next visit Increase gabapentin to 300 mg 3 times per day Continue physical therapy 3 times per week

## 2012-04-12 NOTE — Progress Notes (Signed)
Subjective:    Patient ID: Larry Randall, male    DOB: 1950/10/07, 62 y.o.   MRN: [id number]  HPI DOI: 11/08/2011  Fall off ladder while cleaning off ash bin, minor burns.  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 Required open reduction internal fixation with lateral plate and screws of the right calcaneal fracture. There was a lateral plate and screws of the left tibial fracture. Did not require inpatient rehabilitation. Received home health physical therapy and is now in outpatient physical therapy. Doing aquatic therapy with water treadmill walking. He is currently walking 15 minutes forward and vitamins backwards. She is out of the [employer]. Reviewed orthopedic notes. Evidence of good fracture healing. No hardware complications. Has been taking hydrocodone 1 or 2 tablets per day. Takes one or 2 Baby aspirin a week.Also takes Aleve 2 tablets every 2-3 days left ankle is feeling better however the right foot and ankle are still very painful. Feels like he is standing on a 2 x 4 with his right lateral foot.  Past medical history significant for heart disease status post stent  History of hypertension.  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 General activity 6 Relation with others 5 Enjoyment of life 7 What TIME of day is your pain at its worst? day and evening Sleep (in general) Fair  Pain is worse with: walking, standing and some activites Pain improves with: rest, heat/ice and therapy/exercise Relief from Meds: 5  Mobility walk without assistance use a walker how many minutes can you walk? 15 ability to climb steps?  no do you drive?   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  no  Physicians involved in your care Any changes since last visit?  no   Family History  Problem Relation Age of Onset  . Heart disease Father   . Colon cancer Neg Hx   . Esophageal cancer Neg Hx   . Rectal cancer Neg Hx   . Stomach cancer Neg Hx    History   Social History  . Marital Status: Divorced    Spouse Name: N/A    Number of Children: 3  . Years of Education: N/A   Social History Main Topics  . Smoking status: Former Smoker -- 1.00 packs/day for 30 years    Types: Cigarettes    Quit date: 09/02/1994  . Smokeless tobacco: Never Used  . Alcohol Use: No  . Drug Use: No  . Sexually Active: None   Other Topics Concern  . None   Social History Narrative  . None   Past Surgical History  Procedure Laterality Date  . Tonsillectomy and adenoidectomy  CHILD  . Extracorporeal shock wave lithotripsy  05-02-2009  . Transurethral resection of prostate  X2   YRS AGO    BEFORE 1996  . Coronary angioplasty with stent placement  1996    PER PT X3 STENTS (BARE METAL)  . Transurethral resection of prostate  09/07/2011    Procedure: TRANSURETHRAL  RESECTION OF THE PROSTATE WITH GYRUS INSTRUMENTS;  Surgeon: Marcine Matar, MD;  [facility];  Service: Urology;  Laterality: N/A;  2 HRS NEEDS A BED  gyrus loop       . External fixation leg  11/09/2011    Procedure: EXTERNAL FIXATION LEG;  Surgeon: Toni Arthurs, MD;  Location: [facility] OR;  Service: Orthopedics;  Laterality: Left;  . Orif calcaneous fracture  11/12/2011    Procedure: OPEN REDUCTION INTERNAL FIXATION (ORIF) CALCANEOUS FRACTURE;  Surgeon: Toni Arthurs, MD;  Location: [facility] OR;  Service: Orthopedics;  Laterality: Right;  ORIF RIGHT CALCANEOUS FRACTURE  . Orif tibia fracture   11/17/2011    Procedure: OPEN REDUCTION INTERNAL FIXATION (ORIF) TIBIA FRACTURE;  Surgeon: Toni Arthurs, MD;  Location: [facility] OR;  Service: Orthopedics;  Laterality: Left;  REMOVAL OF EXFIX, ORIF LEFT TIBIAL PILON FRACTURE   Past Medical History  Diagnosis Date  . Anxiety   . Arthritis   . GERD (gastroesophageal reflux disease)   . Hyperlipemia   . CAD (coronary artery disease) FOLLOWED PCP  DR Barnetta Chapel  [facility])    PT DENIES CARDIAC SYMPTOMS  . History of MI (myocardial infarction) 1996--  S/P ANGIOPLASTY X3 STENTS  . S/P primary angioplasty with coronary stent X3 STENTS  1996  POST MI    PER PT BARE METAL STENTS  . H/O hiatal hernia   . History of CHF (congestive heart failure)   . History of gastric ulcer   . Atypical rash OF MOUTH -- PER PT UNKNOWN ETIOLOGY    RX MOUTHWASH BID-  PT STATES POSSIBLE FROM GERD  . History of kidney stones   . Depression   . Colon polyp    BP 143/88  Pulse 83  Resp 14  Ht 5\' 11"  (1.803 m)  Wt 194 lb (87.998 kg)  BMI 27.07 kg/m2  SpO2 95%     Review of Systems  Musculoskeletal: Positive for gait problem.  All other systems reviewed and are negative.       Objective:   Physical Exam  Right foot no hyperhidrosis or hypohidrosis Normal range of motion in the right lower extremity Decreased sensation right S1 dermatomal distribution General no acute distress Oriented to person place and time Ambulation is with reduced weight bearing on the right lower extremity. Right MTPs without tenderness or swelling Normal pulses in the right foot       Assessment & Plan:  1. Complex regional pain syndrome1 although majority of symptoms follow a tibial nerve distribution. 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 Good response from prednisone burst and taper Patient is not at MMI Increase gabapentin to 300 mg 3 times a day Schedule for right tibial nerve block Continue  physical therapy Sedentary work restrictions Consider lumbar sympathetic  right L3-4 If no significant improvement

## 2012-04-14 ENCOUNTER — Telehealth: Payer: Self-pay

## 2012-04-14 NOTE — Telephone Encounter (Signed)
Left message for patient to call office regarding his inconsistent urine drug screen.

## 2012-04-14 NOTE — Telephone Encounter (Signed)
Message copied by Judd Gaudier on Thu Apr 14, 2012  8:25 AM ------      Message from: Su Monks      Created: Mon Apr 11, 2012 11:27 AM       This is a new patient, please ask whether he gets the soma from a different provider, please call patient and ask where he got it from, and then let Dr. Doroteo Bradford know, and let him make the decision what to do with him, because he is a new patient. ------

## 2012-04-15 NOTE — Telephone Encounter (Signed)
Patient said he does not have a script for this.  Then he said he was given soma after a procedure he had and he had taken some before he came in.  Advised him we did not have record of this and it is important to always give clear medication list.  Patient understood.

## 2012-04-19 ENCOUNTER — Ambulatory Visit: Payer: Self-pay | Admitting: Physical Medicine & Rehabilitation

## 2012-04-26 ENCOUNTER — Ambulatory Visit (HOSPITAL_BASED_OUTPATIENT_CLINIC_OR_DEPARTMENT_OTHER): Payer: Worker's Compensation | Admitting: Physical Medicine & Rehabilitation

## 2012-04-26 ENCOUNTER — Encounter: Payer: Self-pay | Admitting: Physical Medicine & Rehabilitation

## 2012-04-26 ENCOUNTER — Encounter: Payer: Worker's Compensation | Attending: Physical Medicine & Rehabilitation

## 2012-04-26 VITALS — BP 175/87 | HR 83 | Ht 71.0 in | Wt 199.6 lb

## 2012-04-26 DIAGNOSIS — G90529 Complex regional pain syndrome I of unspecified lower limb: Secondary | ICD-10-CM | POA: Insufficient documentation

## 2012-04-26 DIAGNOSIS — I2510 Atherosclerotic heart disease of native coronary artery without angina pectoris: Secondary | ICD-10-CM | POA: Insufficient documentation

## 2012-04-26 DIAGNOSIS — K219 Gastro-esophageal reflux disease without esophagitis: Secondary | ICD-10-CM | POA: Insufficient documentation

## 2012-04-26 DIAGNOSIS — E785 Hyperlipidemia, unspecified: Secondary | ICD-10-CM | POA: Insufficient documentation

## 2012-04-26 DIAGNOSIS — I252 Old myocardial infarction: Secondary | ICD-10-CM | POA: Insufficient documentation

## 2012-04-26 DIAGNOSIS — G90521 Complex regional pain syndrome I of right lower limb: Secondary | ICD-10-CM

## 2012-04-26 MED ORDER — GABAPENTIN 100 MG PO CAPS: 100.0000 mg | ORAL_CAPSULE | Freq: Four times a day (QID) | ORAL | Status: DC

## 2012-04-26 NOTE — Progress Notes (Signed)
Right tibial nerve block Under nerve stimulator guidance Indication causalgia right tibial nerve distribution only partially response to medication management and other conservative care Informed consent was obtained after describing risks and benefits of the procedure with the patient these include bleeding bruising and infection he elects to proceed and has given written consent Patient placed in prone position area marked and prepped with Betadine in the popliteal area. EMG stimulator used to localize plantar flexion twitch. Area marked and prepped with Betadine and alcohol and injured with a 22-gauge 40 mm needle electrode. Under e-stim guidance plantar flexion twitch was obtained and confirmed at 0.5 milliamps followed by injection of 1 cc of 40 mg Depo-Medrol and 4 cc 0.25% Marcaine patient tolerated procedure well

## 2012-04-26 NOTE — Patient Instructions (Signed)
You may have numbness on the bottom of your foot for a day You may have weakness in your calf muscle today Increase gabapentin to 4 times per day

## 2012-05-04 ENCOUNTER — Telehealth: Payer: Self-pay

## 2012-05-04 NOTE — Telephone Encounter (Signed)
Mr Tokunaga misunderstood his directions and has been taking the gabapentin 100mg  (4 capsules) three times per day). The directions on the bottle which he just saw today, is for 1 capsule 4 times per day.  What should he do to proceed? (Dr Wynn Banker out of office til 3/24)

## 2012-05-04 NOTE — Telephone Encounter (Signed)
Patient called.  Did not leave message.

## 2012-05-04 NOTE — Telephone Encounter (Signed)
He has to go down slowly, take 300mg  tid for 3 days then go down to 200mg  tid for 3 days, then can go down to 100mg  tid.  100mg  tid is a very low dose, if he has good relief with the 200 or 300mg  tid, and he tolerates this dose, he should be able to stay on that dose.  But verify with Dr. Doroteo Bradford when he is back , I do not know this patient.

## 2012-05-05 NOTE — Telephone Encounter (Signed)
Larry Randall notified of the directions. He has an appt with Dr Wynn Banker on Monday 3/24

## 2012-05-09 ENCOUNTER — Encounter: Payer: Self-pay | Admitting: Physical Medicine & Rehabilitation

## 2012-05-09 ENCOUNTER — Ambulatory Visit (HOSPITAL_BASED_OUTPATIENT_CLINIC_OR_DEPARTMENT_OTHER): Payer: Worker's Compensation | Admitting: Physical Medicine & Rehabilitation

## 2012-05-09 VITALS — BP 179/96 | HR 112 | Ht 71.0 in | Wt 201.0 lb

## 2012-05-09 DIAGNOSIS — G90521 Complex regional pain syndrome I of right lower limb: Secondary | ICD-10-CM

## 2012-05-09 DIAGNOSIS — G90529 Complex regional pain syndrome I of unspecified lower limb: Secondary | ICD-10-CM

## 2012-05-09 MED ORDER — GABAPENTIN 100 MG PO CAPS: 200.0000 mg | ORAL_CAPSULE | Freq: Four times a day (QID) | ORAL | Status: DC

## 2012-05-09 MED ORDER — LIDOCAINE 5 % EX OINT
TOPICAL_OINTMENT | CUTANEOUS | Status: DC | PRN
Start: 2012-05-09 — End: 2012-08-26

## 2012-05-09 NOTE — Progress Notes (Addendum)
Subjective:    Patient ID: Larry Randall, male    DOB: 03-16-1950, 62 y.o.   MRN: [id number]  HPI DOI: 11/08/2011  Fall off ladder while cleaning off ash bin, minor burns.  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 Required open reduction internal fixation with lateral plate and screws of the right calcaneal fracture. There was a lateral plate and screws of the left tibial fracture. Did not require inpatient rehabilitation. Received home health physical therapy and is now in outpatient physical therapy. Doing aquatic therapy with water treadmill walking. He is currently walking 15 minutes forward and vitamins backwards. She is out of the [employer]. Reviewed orthopedic notes. Evidence of good fracture healing. No hardware complications. Has been taking hydrocodone 1 or 2 tablets per day. Takes one or 2 Baby aspirin a week.Also takes Aleve 2 tablets every 2-3 days left ankle is feeling better however the right foot and ankle are still very painful. Feels like he is standing on a 2 x 4 with his right lateral foot.  Past medical history significant for heart disease status post stent   Right tibial nerve block 04/26/2012- Helpful for for several days.   Neurontin was taken 4 tablets 4 times a day or 400 mg 4 times a day. Patient had side effect of lightheadedness. Was advised to taper off and is now back on 100 mg 4 times per day. He did note improved pain control on the higher dose.  Pain Inventory Average Pain 5 Pain Right Now 5 My pain is burning, dull and aching  In the last 24 hours, has pain interfered with the following? General activity 5 Relation with others 3 Enjoyment of life 7 What TIME of day is your pain at its worst? varies Sleep (in general) Fair  Pain is worse with: walking and standing Pain improves with: rest, heat/ice, therapy/exercise, medication and  injections Relief from Meds: 5  Mobility walk without assistance use a cane how many minutes can you walk? 20 ability to climb steps?  yes do you drive?  yes  Function I need assistance with the following:  shopping  Neuro/Psych numbness trouble walking depression anxiety  Prior Studies Any changes since last visit?  no  Physicians involved in your care Any changes since last visit?  no   Family History  Problem Relation Age of Onset  . Heart disease Father   . Colon cancer Neg Hx   . Esophageal cancer Neg Hx   . Rectal cancer Neg Hx   . Stomach cancer Neg Hx    History   Social History  . Marital Status: Divorced    Spouse Name: N/A    Number of Children: 3  . Years of Education: N/A   Social History Main Topics  . Smoking status: Former Smoker -- 1.00 packs/day for 30 years    Types: Cigarettes    Quit date: 09/02/1994  . Smokeless tobacco: Never Used  . Alcohol Use: No  . Drug Use: No  . Sexually Active: None   Other Topics Concern  . None   Social History Narrative  . None   Past Surgical History  Procedure Laterality Date  . Tonsillectomy and adenoidectomy  CHILD  . Extracorporeal shock wave lithotripsy  05-02-2009  . Transurethral resection of prostate  X2   YRS AGO    BEFORE 1996  . Coronary angioplasty with  stent placement  1996    PER PT X3 STENTS (BARE METAL)  . Transurethral resection of prostate  09/07/2011    Procedure: TRANSURETHRAL RESECTION OF THE PROSTATE WITH GYRUS INSTRUMENTS;  Surgeon: Marcine Matar, MD;  Location: [facility];  Service: Urology;  Laterality: N/A;  2 HRS NEEDS A BED  gyrus loop       . External fixation leg  11/09/2011    Procedure: EXTERNAL FIXATION LEG;  Surgeon: Toni Arthurs, MD;  Location: [facility] OR;  Service: Orthopedics;  Laterality: Left;  . Orif calcaneous fracture  11/12/2011    Procedure: OPEN REDUCTION INTERNAL FIXATION (ORIF) CALCANEOUS FRACTURE;  Surgeon: Toni Arthurs, MD;   Location: [facility] OR;  Service: Orthopedics;  Laterality: Right;  ORIF RIGHT CALCANEOUS FRACTURE  . Orif tibia fracture  11/17/2011    Procedure: OPEN REDUCTION INTERNAL FIXATION (ORIF) TIBIA FRACTURE;  Surgeon: Toni Arthurs, MD;  Location: [facility] OR;  Service: Orthopedics;  Laterality: Left;  REMOVAL OF EXFIX, ORIF LEFT TIBIAL PILON FRACTURE   Past Medical History  Diagnosis Date  . Anxiety   . Arthritis   . GERD (gastroesophageal reflux disease)   . Hyperlipemia   . CAD (coronary artery disease) FOLLOWED PCP  DR Barnetta Chapel  [facility])    PT DENIES CARDIAC SYMPTOMS  . History of MI (myocardial infarction) 1996--  S/P ANGIOPLASTY X3 STENTS  . S/P primary angioplasty with coronary stent X3 STENTS  1996  POST MI    PER PT BARE METAL STENTS  . H/O hiatal hernia   . History of CHF (congestive heart failure)   . History of gastric ulcer   . Atypical rash OF MOUTH -- PER PT UNKNOWN ETIOLOGY    RX MOUTHWASH BID-  PT STATES POSSIBLE FROM GERD  . History of kidney stones   . Depression   . Colon polyp    BP 179/96  Pulse 112  Ht 5\' 11"  (1.803 m)  Wt 201 lb (91.173 kg)  BMI 28.05 kg/m2  SpO2 96%    Review of Systems  Constitutional: Positive for unexpected weight change.  Musculoskeletal: Positive for gait problem.  Neurological: Positive for numbness.  Psychiatric/Behavioral: Positive for dysphoric mood. The patient is nervous/anxious.   All other systems reviewed and are negative.       Objective:   Physical Exam Right foot no hyperhidrosis or hypohidrosis Normal range of motion in the right lower extremity Decreased sensation right S1 dermatomal distribution General no acute distress Oriented to person place and time Ambulation is with reduced weight bearing on the right lower extremity. Right MTPs without tenderness or swelling Normal pulses in the right foot Tenderness over the right lateral heel       Assessment & Plan:   Assessment & Plan:  1. Complex  regional pain syndrome1 although majority of symptoms follow a tibial nerve distribution. 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 Good response from prednisone burst and taper Patient is not at MMI Increase gabapentin to 300 mg 3 times a day Schedule for right tibial nerve block Continue physical therapy Sedentary work restrictions Right L3-L4 lumbar sympathetic injection Dr. Yolanda Bonine  Discussed findings with the patient as well as his case manager Elnita Maxwell coats

## 2012-05-09 NOTE — Patient Instructions (Addendum)
Lidocaine ointment to Foot 3-4 times daily Increase Gabapentin to 200mg   4 times daily Cont. Sedentary restrictions Dr Yolanda Bonine will try lumbar injections

## 2012-05-13 ENCOUNTER — Ambulatory Visit: Payer: Self-pay | Admitting: Physical Medicine & Rehabilitation

## 2012-06-06 ENCOUNTER — Ambulatory Visit: Payer: Self-pay | Admitting: Physical Medicine & Rehabilitation

## 2012-06-17 ENCOUNTER — Encounter: Payer: Self-pay | Admitting: Physical Medicine & Rehabilitation

## 2012-06-17 ENCOUNTER — Ambulatory Visit (HOSPITAL_BASED_OUTPATIENT_CLINIC_OR_DEPARTMENT_OTHER): Payer: Worker's Compensation | Admitting: Physical Medicine & Rehabilitation

## 2012-06-17 ENCOUNTER — Encounter: Payer: Worker's Compensation | Attending: Physical Medicine & Rehabilitation

## 2012-06-17 VITALS — BP 168/95 | HR 80 | Resp 16 | Ht 71.0 in | Wt 201.0 lb

## 2012-06-17 DIAGNOSIS — I2510 Atherosclerotic heart disease of native coronary artery without angina pectoris: Secondary | ICD-10-CM | POA: Insufficient documentation

## 2012-06-17 DIAGNOSIS — G8921 Chronic pain due to trauma: Secondary | ICD-10-CM

## 2012-06-17 DIAGNOSIS — I252 Old myocardial infarction: Secondary | ICD-10-CM | POA: Insufficient documentation

## 2012-06-17 DIAGNOSIS — E785 Hyperlipidemia, unspecified: Secondary | ICD-10-CM | POA: Insufficient documentation

## 2012-06-17 DIAGNOSIS — G90529 Complex regional pain syndrome I of unspecified lower limb: Secondary | ICD-10-CM

## 2012-06-17 DIAGNOSIS — K219 Gastro-esophageal reflux disease without esophagitis: Secondary | ICD-10-CM | POA: Insufficient documentation

## 2012-06-17 DIAGNOSIS — G90521 Complex regional pain syndrome I of right lower limb: Secondary | ICD-10-CM

## 2012-06-17 NOTE — Patient Instructions (Signed)
Continue gabapentin 300 mg 3 times per day May work light duty no more than 20 pound lifting Return 3 weeks DuoDERM or Mepilex dressing to right heel
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
## 2012-07-14 ENCOUNTER — Ambulatory Visit: Payer: Worker's Compensation | Admitting: Physical Medicine & Rehabilitation

## 2012-07-18 ENCOUNTER — Other Ambulatory Visit: Payer: Self-pay | Admitting: *Deleted

## 2012-07-18 MED ORDER — GABAPENTIN 100 MG PO CAPS: 200.0000 mg | ORAL_CAPSULE | Freq: Four times a day (QID) | ORAL | Status: DC

## 2012-07-25 ENCOUNTER — Ambulatory Visit: Payer: Worker's Compensation | Admitting: Physical Medicine & Rehabilitation

## 2012-07-25 ENCOUNTER — Ambulatory Visit: Payer: Worker's Compensation

## 2012-08-01 ENCOUNTER — Encounter: Payer: Self-pay | Admitting: Physical Medicine & Rehabilitation

## 2012-08-01 ENCOUNTER — Ambulatory Visit (HOSPITAL_BASED_OUTPATIENT_CLINIC_OR_DEPARTMENT_OTHER): Payer: Worker's Compensation | Admitting: Physical Medicine & Rehabilitation

## 2012-08-01 ENCOUNTER — Encounter: Payer: Worker's Compensation | Attending: Physical Medicine & Rehabilitation

## 2012-08-01 VITALS — BP 165/97 | HR 92 | Resp 14 | Ht 71.0 in | Wt 207.6 lb

## 2012-08-01 DIAGNOSIS — K219 Gastro-esophageal reflux disease without esophagitis: Secondary | ICD-10-CM | POA: Insufficient documentation

## 2012-08-01 DIAGNOSIS — I2510 Atherosclerotic heart disease of native coronary artery without angina pectoris: Secondary | ICD-10-CM | POA: Insufficient documentation

## 2012-08-01 DIAGNOSIS — I252 Old myocardial infarction: Secondary | ICD-10-CM | POA: Insufficient documentation

## 2012-08-01 DIAGNOSIS — G90529 Complex regional pain syndrome I of unspecified lower limb: Secondary | ICD-10-CM | POA: Insufficient documentation

## 2012-08-01 DIAGNOSIS — E785 Hyperlipidemia, unspecified: Secondary | ICD-10-CM | POA: Insufficient documentation

## 2012-08-01 DIAGNOSIS — G90521 Complex regional pain syndrome I of right lower limb: Secondary | ICD-10-CM

## 2012-08-01 DIAGNOSIS — G8921 Chronic pain due to trauma: Secondary | ICD-10-CM

## 2012-08-01 NOTE — Patient Instructions (Signed)
Pre op Lumbar sympathetic block with Dr Yolanda Bonine  Reduce Gabapentin to 100mg  3 a day,   Restrict to 40 hours per wk  5 work days per week  35 lb max occ lifting

## 2012-08-01 NOTE — Progress Notes (Signed)
Subjective:    Patient ID: Larry Randall, male    DOB: 04-15-1950, 62 y.o.   MRN: [id number]  HPI Left tibial pilon fracture, right calcaneal fracture sustained in a work related injury above. Required open reduction internal fixation with lateral plate and screws of the right calcaneal fracture. There was a lateral plate and screws of the left tibial fracture. Did not require inpatient rehabilitation. Received home health physical therapy and is now in outpatient physical therapy. Doing aquatic therapy with water treadmill walking. He is currently walking 15 minutes forward and vitamins backwards. She is out of the [employer]. Reviewed orthopedic notes. Evidence of good fracture healing. No hardware complications.  Right ankle hardware removal July 29 , left ankle hardware removal should be ~ 6 weeks later Anesthesiologist recommends preoperative for lumbar sympathetic block  Overall doing well. Not sure whether gabapentin is helping.  Sore at the ankles over the weekend  Works about 8 hours per day, only has taken 2 days off in the last 5 weeks            Pain Inventory Average Pain 3 Pain Right Now 3 My pain is burning, dull and aching  In the last 24 hours, has pain interfered with the following? General activity 2 Relation with others 3 Enjoyment of life 4 What TIME of day is your pain at its worst? varies Sleep (in general) Fair  Pain is worse with: walking and standing Pain improves with: heat/ice and injections Relief from Meds: 4  Mobility walk without assistance how many minutes can you walk? 30 ability to climb steps?  yes do you drive?  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  no  Physicians involved in your care Any changes since last visit?  no   Family History  Problem Relation Age of Onset  . Heart disease Father   . Colon cancer Neg Hx    . Esophageal cancer Neg Hx   . Rectal cancer Neg Hx   . Stomach cancer Neg Hx    History   Social History  . Marital Status: Divorced    Spouse Name: N/A    Number of Children: 3  . Years of Education: N/A   Social History Main Topics  . Smoking status: Former Smoker -- 1.00 packs/day for 30 years    Types: Cigarettes    Quit date: 09/02/1994  . Smokeless tobacco: Never Used  . Alcohol Use: No  . Drug Use: No  . Sexually Active: None   Other Topics Concern  . None   Social History Narrative  . None   Past Surgical History  Procedure Laterality Date  . Tonsillectomy and adenoidectomy  CHILD  . Extracorporeal shock wave lithotripsy  05-02-2009  . Transurethral resection of prostate  X2   YRS AGO    BEFORE 1996  . Coronary angioplasty with stent placement  1996    PER PT X3 STENTS (BARE METAL)  . Transurethral resection of prostate  09/07/2011    Procedure: TRANSURETHRAL RESECTION OF THE PROSTATE WITH GYRUS INSTRUMENTS;  Surgeon: Marcine Matar, MD;  Location: [facility];  Service: Urology;  Laterality: N/A;  2 HRS NEEDS A BED  gyrus loop       . External fixation leg  11/09/2011    Procedure: EXTERNAL FIXATION LEG;  Surgeon: Toni Arthurs, MD;  Location: [facility] OR;  Service: Orthopedics;  Laterality: Left;  . Orif calcaneous fracture  11/12/2011    Procedure: OPEN REDUCTION INTERNAL FIXATION (ORIF) CALCANEOUS FRACTURE;  Surgeon: Toni Arthurs, MD;  Location: [facility] OR;  Service: Orthopedics;  Laterality: Right;  ORIF RIGHT CALCANEOUS FRACTURE  . Orif tibia fracture  11/17/2011    Procedure: OPEN REDUCTION INTERNAL FIXATION (ORIF) TIBIA FRACTURE;  Surgeon: Toni Arthurs, MD;  Location: [facility] OR;  Service: Orthopedics;  Laterality: Left;  REMOVAL OF EXFIX, ORIF LEFT TIBIAL PILON FRACTURE   Past Medical History  Diagnosis Date  . Anxiety   . Arthritis   . GERD (gastroesophageal reflux disease)   . Hyperlipemia   . CAD (coronary artery disease) FOLLOWED PCP  DR  Barnetta Chapel  [facility])    PT DENIES CARDIAC SYMPTOMS  . History of MI (myocardial infarction) 1996--  S/P ANGIOPLASTY X3 STENTS  . S/P primary angioplasty with coronary stent X3 STENTS  1996  POST MI    PER PT BARE METAL STENTS  . H/O hiatal hernia   . History of CHF (congestive heart failure)   . History of gastric ulcer   . Atypical rash OF MOUTH -- PER PT UNKNOWN ETIOLOGY    RX MOUTHWASH BID-  PT STATES POSSIBLE FROM GERD  . History of kidney stones   . Depression   . Colon polyp    BP 165/97  Pulse 92  Resp 14  Ht 5\' 11"  (1.803 m)  Wt 207 lb 9.6 oz (94.167 kg)  BMI 28.97 kg/m2  SpO2 95%   Review of Systems  Constitutional: Positive for unexpected weight change.  Psychiatric/Behavioral: Positive for dysphoric mood.       Objective:   Physical Exam Right foot no hyperhidrosis or hypohidrosis  Normal range of motion in the right lower extremity  Decreased sensation right S1 dermatomal distribution  General no acute distress  Oriented to person place and time  Ambulation is with reduced weight bearing on the right lower extremity.  Right MTPs without tenderness or swelling  Normal pulses in the right foot  Tenderness over the right lateral heel and right medial heel        Assessment & Plan:  1. Complex regional pain syndrome1 although majority of symptoms follow a tibial nerve distribution. 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 Good response from prednisone burst and taper  Patient is not at MMI  Decrease gabapentin reduce to one tab TID Patient to followup with orthopedic ankle and foot surgeon to discuss possible hardware removal  Continue physical therapy  Light duty 35lb lifting work restrictions  Restrict work to 8 hrs per day 5 days per week Right L3-L4 lumbar sympathetic injection Dr. Yolanda Bonine - recommend pre op block within 1 wk of surgery Discussed findings with the patient as well as  his case manager Noralee Chars facts [phone]

## 2012-08-26 ENCOUNTER — Ambulatory Visit (HOSPITAL_BASED_OUTPATIENT_CLINIC_OR_DEPARTMENT_OTHER): Payer: Worker's Compensation | Admitting: Physical Medicine & Rehabilitation

## 2012-08-26 ENCOUNTER — Encounter: Payer: Self-pay | Admitting: Physical Medicine & Rehabilitation

## 2012-08-26 ENCOUNTER — Encounter: Payer: Worker's Compensation | Attending: Physical Medicine & Rehabilitation

## 2012-08-26 VITALS — BP 170/99 | HR 84 | Resp 14 | Ht 71.0 in | Wt 206.4 lb

## 2012-08-26 DIAGNOSIS — G8921 Chronic pain due to trauma: Secondary | ICD-10-CM

## 2012-08-26 DIAGNOSIS — G90521 Complex regional pain syndrome I of right lower limb: Secondary | ICD-10-CM

## 2012-08-26 DIAGNOSIS — G90529 Complex regional pain syndrome I of unspecified lower limb: Secondary | ICD-10-CM

## 2012-08-26 DIAGNOSIS — I2510 Atherosclerotic heart disease of native coronary artery without angina pectoris: Secondary | ICD-10-CM | POA: Insufficient documentation

## 2012-08-26 DIAGNOSIS — K219 Gastro-esophageal reflux disease without esophagitis: Secondary | ICD-10-CM | POA: Insufficient documentation

## 2012-08-26 DIAGNOSIS — E785 Hyperlipidemia, unspecified: Secondary | ICD-10-CM | POA: Insufficient documentation

## 2012-08-26 DIAGNOSIS — I252 Old myocardial infarction: Secondary | ICD-10-CM | POA: Insufficient documentation

## 2012-08-26 MED ORDER — LIDOCAINE 5 % EX OINT: TOPICAL_OINTMENT | Freq: Three times a day (TID) | CUTANEOUS | Status: DC

## 2012-08-26 NOTE — Patient Instructions (Addendum)
Postoperatively please get Oxycodone from orthopedic surgeon Increase gabapentin to 200 mg 4 times a day Resume lidocaine cream 3 times a day Return to clinic in 3 months Continue current restrictions 35 pounds lifting 8 hour days 5 days per week Lumbar sympathetic injection one week prior to hardware removal already scheduled

## 2012-08-26 NOTE — Progress Notes (Signed)
Subjective:  DOI:  11/08/2011  Patient ID: Larry Randall, male    DOB: [birth year], 62 y.o.   MRN: [id number] Left tibial pilon fracture, right calcaneal fracture sustained in a work related injury above. Required open reduction internal fixation with lateral plate and screws of the right calcaneal fracture. There was a lateral plate and screws of the left tibial fracture. Did not require inpatient rehabilitation. Received home health physical therapy and is now in outpatient physical therapy. Doing aquatic therapy with water treadmill walking. He is currently walking 15 minutes forward and vitamins backwards. She is out of the [employer]. Reviewed orthopedic notes. Evidence of good fracture healing. No hardware complications.     HPI Right ankle hardware removal July 29 , left ankle hardware removal should be ~ 6 weeks later  Anesthesiologist recommends preoperative for lumbar sympathetic block which is scheduled 09/07/2012 Pain Inventory Average Pain 4 Pain Right Now 5 My pain is burning and aching  In the last 24 hours, has pain interfered with the following? General activity 4 Relation with others 3 Enjoyment of life 4 What TIME of day is your pain at its worst? morning daytime evening Sleep (in general) Fair  Pain is worse with: walking and standing Pain improves with: rest Relief from Meds: 5  Mobility walk without assistance how many minutes can you walk? 30 ability to climb steps?  yes do you drive?  yes  Function employed # of hrs/week 48  Neuro/Psych trouble walking depression  Prior Studies Any changes since last visit?  no  Physicians involved in your care Any changes since last visit?  no   Family History  Problem Relation Age of Onset  . Heart disease Father   . Colon cancer Neg Hx   . Esophageal cancer Neg Hx   . Rectal cancer Neg Hx   . Stomach cancer Neg Hx    History   Social History  . Marital Status: Divorced    Spouse Name: N/A     Number of Children: 3  . Years of Education: N/A   Social History Main Topics  . Smoking status: Former Smoker -- 1.00 packs/day for 30 years    Types: Cigarettes    Quit date: 09/02/1994  . Smokeless tobacco: Never Used  . Alcohol Use: No  . Drug Use: No  . Sexually Active: None   Other Topics Concern  . None   Social History Narrative  . None   Past Surgical History  Procedure Laterality Date  . Tonsillectomy and adenoidectomy  CHILD  . Extracorporeal shock wave lithotripsy  05-02-2009  . Transurethral resection of prostate  X2   YRS AGO    BEFORE 1996  . Coronary angioplasty with stent placement  1996    PER PT X3 STENTS (BARE METAL)  . Transurethral resection of prostate  09/07/2011    Procedure: TRANSURETHRAL RESECTION OF THE PROSTATE WITH GYRUS INSTRUMENTS;  Surgeon: Marcine Matar, MD;  Location[facility];  Service: Urology;  Laterality: N/A;  2 HRS NEEDS A BED  gyrus loop       . External fixation leg  11/09/2011    Procedure: EXTERNAL FIXATION LEG;  Surgeon: Toni Arthurs, MD;  Location: [facility] OR;  Service: Orthopedics;  Laterality: Left;  . Orif calcaneous fracture  11/12/2011    Procedure: OPEN REDUCTION INTERNAL FIXATION (ORIF) CALCANEOUS FRACTURE;  Surgeon: Toni Arthurs, MD;  Location: [facility] OR;  Service: Orthopedics;  Laterality: Right;  ORIF RIGHT CALCANEOUS FRACTURE  .  Orif tibia fracture  11/17/2011    Procedure: OPEN REDUCTION INTERNAL FIXATION (ORIF) TIBIA FRACTURE;  Surgeon: Toni Arthurs, MD;  Location: [facility] OR;  Service: Orthopedics;  Laterality: Left;  REMOVAL OF EXFIX, ORIF LEFT TIBIAL PILON FRACTURE   Past Medical History  Diagnosis Date  . Anxiety   . Arthritis   . GERD (gastroesophageal reflux disease)   . Hyperlipemia   . CAD (coronary artery disease) FOLLOWED PCP  DR Barnetta Chapel  [facility])    PT DENIES CARDIAC SYMPTOMS  . History of MI (myocardial infarction) 1996--  S/P ANGIOPLASTY X3 STENTS  . S/P primary  angioplasty with coronary stent X3 STENTS  1996  POST MI    PER PT BARE METAL STENTS  . H/O hiatal hernia   . History of CHF (congestive heart failure)   . History of gastric ulcer   . Atypical rash OF MOUTH -- PER PT UNKNOWN ETIOLOGY    RX MOUTHWASH BID-  PT STATES POSSIBLE FROM GERD  . History of kidney stones   . Depression   . Colon polyp    BP 170/99  Pulse 84  Resp 14  Ht 5\' 11"  (1.803 m)  Wt 206 lb 6.4 oz (93.622 kg)  BMI 28.8 kg/m2  SpO2 96%     Review of Systems  Constitutional: Positive for unexpected weight change.  Musculoskeletal: Positive for gait problem.  Psychiatric/Behavioral: Positive for dysphoric mood.  All other systems reviewed and are negative.       Objective:   Physical Exam  Right foot no hyperhidrosis or hypohidrosis  Normal range of motion in the right lower extremity ankle and foot, mildly diminished Left ankle DF Decreased sensation bilateral L5 and  S1 dermatomal distribution  General no acute distress  Oriented to person place and time  Ambulation is with reduced weight bearing on the right lower extremity.  Right MTPs without tenderness or swelling  Normal pulses in the right foot  Tenderness over the right lateral heel and right medial heel Left heel without plantar sensory loss      Assessment & Plan:  1. Complex regional pain syndrome1 although majority of symptoms follow a tibial nerve distribution. 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 Yolanda Bonine - recommend pre op block within 1 wk of surgery  Discussed  findings with the patient as well as his case manager Noralee Chars facts [phone]  If patient experiences flareup postoperatively may benefit from prednisone burst and taper

## 2012-10-27 ENCOUNTER — Other Ambulatory Visit: Payer: Self-pay

## 2012-10-27 MED ORDER — GABAPENTIN 100 MG PO CAPS: 200.0000 mg | ORAL_CAPSULE | Freq: Four times a day (QID) | ORAL | Status: DC

## 2012-11-25 ENCOUNTER — Encounter: Payer: Self-pay | Admitting: Physical Medicine & Rehabilitation

## 2012-11-25 ENCOUNTER — Ambulatory Visit (HOSPITAL_BASED_OUTPATIENT_CLINIC_OR_DEPARTMENT_OTHER): Payer: Worker's Compensation | Admitting: Physical Medicine & Rehabilitation

## 2012-11-25 ENCOUNTER — Encounter: Payer: Worker's Compensation | Attending: Physical Medicine & Rehabilitation

## 2012-11-25 VITALS — BP 155/91 | HR 77 | Resp 14 | Ht 71.0 in | Wt 206.0 lb

## 2012-11-25 DIAGNOSIS — K219 Gastro-esophageal reflux disease without esophagitis: Secondary | ICD-10-CM | POA: Insufficient documentation

## 2012-11-25 DIAGNOSIS — E785 Hyperlipidemia, unspecified: Secondary | ICD-10-CM | POA: Insufficient documentation

## 2012-11-25 DIAGNOSIS — I2510 Atherosclerotic heart disease of native coronary artery without angina pectoris: Secondary | ICD-10-CM | POA: Insufficient documentation

## 2012-11-25 DIAGNOSIS — G90529 Complex regional pain syndrome I of unspecified lower limb: Secondary | ICD-10-CM

## 2012-11-25 DIAGNOSIS — G8921 Chronic pain due to trauma: Secondary | ICD-10-CM

## 2012-11-25 DIAGNOSIS — I252 Old myocardial infarction: Secondary | ICD-10-CM | POA: Insufficient documentation

## 2012-11-25 DIAGNOSIS — G90521 Complex regional pain syndrome I of right lower limb: Secondary | ICD-10-CM

## 2012-11-25 NOTE — Patient Instructions (Addendum)
1. Complex regional pain syndrome1 although majority of symptoms follow a tibial nerve distribution. 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.Pain score has improved from 7 to a 5.Patient is not at MMI  Decreased gabapentin 100mg  2 tab TID but nerve pain increased, will resume 200mg  QID  Patient to followup with orthopedic ankle and foot surgeon to discuss possible hardware removal  Continue physical therapy  sedentary work restrictions per Dr Victorino Dike Standing 15 min every hour Restrict work to 8 hrs per day 5 days per week  No further blocks or PT recommended Discussed findings with the patient as well as his case manager Noralee Chars facts [phone]  If patient experiences flareup postoperatively may benefit from prednisone burst and taper  Return to clinic 6 weeks

## 2012-11-25 NOTE — Progress Notes (Signed)
Subjective:    Patient ID: Larry Randall, male    DOB: 1950-12-13, 62 y.o.   MRN: [id number]  HPI DOI: 11/08/2011  Patient ID: Larry Randall, male DOB: 03/18/1950, 62 y.o. MRN: [id number]  Left tibial pilon fracture, right calcaneal fracture sustained in a work related injury above. Required open reduction internal fixation with lateral plate and screws of the right calcaneal fracture. There was a lateral plate and screws of the left tibial fracture. Did not require inpatient rehabilitation. Received home health physical therapy and is now in outpatient physical therapy. Doing aquatic therapy with water treadmill walking. He is currently walking 15 minutes forward and vitamins backwards. She is out of the [employer]. Reviewed orthopedic notes. Evidence of good fracture healing. No hardware complications.  HPI  Right ankle hardware removal July 29,2014 , left ankle hardware removal Sept 23,2014   Has had some increase in right foot pain since the left ankle hardware removal on 11/08/2012. Is placed on light duty by orthopedics.  Stopped using lidocaine ointment prior to surgery, has not restarted this yet  Gabapentin 200 mg, patient's taking 3 times a day. We discussed that he should take it 4 times a day.  Pain Inventory Average Pain 4 Pain Right Now 4 My pain is burning and aching  In the last 24 hours, has pain interfered with the following? General activity 4 Relation with others 6 Enjoyment of life 4 What TIME of day is your pain at its worst? day and morning Sleep (in general) Fair  Pain is worse with: walking and standing Pain improves with: rest and medication Relief from Meds: 5  Mobility walk without assistance how many minutes can you walk? 30 ability to climb steps?  yes do you drive?  yes Do you have any goals in this area?  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  no  Physicians involved in your care Any changes since last visit?  no   Family History  Problem Relation Age of Onset  . Heart disease Father   . Colon cancer Neg Hx   . Esophageal cancer Neg Hx   . Rectal cancer Neg Hx   . Stomach cancer Neg Hx    History   Social History  . Marital Status: Divorced    Spouse Name: N/A    Number of Children: 3  . Years of Education: N/A   Social History Main Topics  . Smoking status: Former Smoker -- 1.00 packs/day for 30 years    Types: Cigarettes    Quit date: 09/02/1994  . Smokeless tobacco: Never Used  . Alcohol Use: No  . Drug Use: No  . Sexual Activity: None   Other Topics Concern  . None   Social History Narrative  . None   Past Surgical History  Procedure Laterality Date  . Tonsillectomy and adenoidectomy  CHILD  . Extracorporeal shock wave lithotripsy  05-02-2009  . Transurethral resection of prostate  X2   YRS AGO    BEFORE 1996  . Coronary angioplasty with stent placement  1996    PER PT X3 STENTS (BARE METAL)  . Transurethral resection of prostate  09/07/2011    Procedure: TRANSURETHRAL RESECTION OF THE PROSTATE WITH GYRUS INSTRUMENTS;  Surgeon: Marcine Matar, MD;  Location: [facility];  Service: Urology;  Laterality: N/A;  2 HRS NEEDS A BED  gyrus loop       .  External fixation leg  11/09/2011    Procedure: EXTERNAL FIXATION LEG;  Surgeon: Toni Arthurs, MD;  Location: [facility] OR;  Service: Orthopedics;  Laterality: Left;  . Orif calcaneous fracture  11/12/2011    Procedure: OPEN REDUCTION INTERNAL FIXATION (ORIF) CALCANEOUS FRACTURE;  Surgeon: Toni Arthurs, MD;  Location: [facility] OR;  Service: Orthopedics;  Laterality: Right;  ORIF RIGHT CALCANEOUS FRACTURE  . Orif tibia fracture  11/17/2011    Procedure: OPEN REDUCTION INTERNAL FIXATION (ORIF) TIBIA FRACTURE;  Surgeon: Toni Arthurs, MD;  Location: [facility] OR;  Service: Orthopedics;  Laterality: Left;  REMOVAL OF EXFIX, ORIF LEFT TIBIAL PILON  FRACTURE   Past Medical History  Diagnosis Date  . Anxiety   . Arthritis   . GERD (gastroesophageal reflux disease)   . Hyperlipemia   . CAD (coronary artery disease) FOLLOWED PCP  DR Barnetta Chapel  [facility])    PT DENIES CARDIAC SYMPTOMS  . History of MI (myocardial infarction) 1996--  S/P ANGIOPLASTY X3 STENTS  . S/P primary angioplasty with coronary stent X3 STENTS  1996  POST MI    PER PT BARE METAL STENTS  . H/O hiatal hernia   . History of CHF (congestive heart failure)   . History of gastric ulcer   . Atypical rash OF MOUTH -- PER PT UNKNOWN ETIOLOGY    RX MOUTHWASH BID-  PT STATES POSSIBLE FROM GERD  . History of kidney stones   . Depression   . Colon polyp    BP 155/91  Pulse 77  Resp 14  Ht 5\' 11"  (1.803 m)  Wt 206 lb (93.441 kg)  BMI 28.74 kg/m2  SpO2 96%     Review of Systems  Constitutional: Positive for unexpected weight change.  Respiratory: Positive for apnea.   Musculoskeletal: Positive for gait problem.  Psychiatric/Behavioral: Positive for dysphoric mood.  All other systems reviewed and are negative.       Objective:   Physical Exam  Right foot no hyperhidrosis or hypohidrosis  Normal range of motion in the right lower extremity ankle and foot, mildly diminished Left ankle DF  Decreased sensation bilateral L5 and S1 dermatomal distribution  General no acute distress  Oriented to person place and time  Ambulation is with reduced weight bearing on the right lower extremity.  Right MTPs without tenderness or swelling  Normal pulses in the right foot  Tenderness over the right lateral heel and right medial heel  Left heel without plantar sensory loss     Assessment & Plan:  1. Complex regional pain syndrome1 although majority of symptoms follow a tibial nerve distribution. 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.Pain score has improved from 7 to a 5.Patient is not at MMI  Decreased gabapentin 100mg  2 tab TID but  nerve pain increased, will resume 200mg  QID  Patient to followup with orthopedic ankle and foot surgeon to discuss possible hardware removal  Continue physical therapy  sedentary work restrictions per Dr Victorino Dike Standing 15 min every hour Restrict work to 8 hrs per day 5 days per week  No further blocks or PT recommended Discussed findings with the patient as well as his case manager Noralee Chars facts [phone]  If patient experiences flareup postoperatively may benefit from prednisone burst and taper  Return to clinic 6 weeks

## 2012-12-09 ENCOUNTER — Ambulatory Visit (HOSPITAL_COMMUNITY)
Admission: RE | Admit: 2012-12-09 | Discharge: 2012-12-09 | Disposition: A | Discharge status: Home / Self Care | Payer: Worker's Compensation | Source: Ambulatory Visit | Attending: Cardiovascular Disease | Admitting: Cardiovascular Disease

## 2012-12-09 ENCOUNTER — Encounter (HOSPITAL_COMMUNITY): Payer: Self-pay

## 2012-12-09 ENCOUNTER — Other Ambulatory Visit (HOSPITAL_COMMUNITY): Payer: Self-pay | Admitting: Orthopedic Surgery

## 2012-12-09 DIAGNOSIS — G90529 Complex regional pain syndrome I of unspecified lower limb: Secondary | ICD-10-CM

## 2012-12-09 DIAGNOSIS — Z9889 Other specified postprocedural states: Secondary | ICD-10-CM

## 2012-12-09 DIAGNOSIS — G8921 Chronic pain due to trauma: Secondary | ICD-10-CM

## 2012-12-09 DIAGNOSIS — M79609 Pain in unspecified limb: Secondary | ICD-10-CM | POA: Insufficient documentation

## 2012-12-09 NOTE — Progress Notes (Signed)
Left Lower Extremity Venous Duplex Completed. Negative for DVT. Brianna L Mazza,RVT

## 2012-12-16 ENCOUNTER — Telehealth: Payer: Self-pay | Admitting: *Deleted

## 2012-12-16 NOTE — Telephone Encounter (Signed)
Larry Randall called to let us know that Larry Randall is is na great deal of pain.  Dr Victorino Dike who removed hardware will not rx since he is seeing Dr Wynn Banker.  I called Larry Randall back aand informed her that unfortunately Dr Wynn Banker is out of the office and will not return until Monday and since he has not been prescribed anything or seen our PA there is nothing we can do at this moment.  Since he is not under contract because we have not prescribed a controlled substance, then if she wants to give him the ol to go to urgent care then that is the only option for now.  Left msg for her to call if further questions.

## 2012-12-19 NOTE — Telephone Encounter (Signed)
Please request Dr Angelica Pou notes per Dr Wynn Banker previous message.

## 2012-12-19 NOTE — Telephone Encounter (Signed)
Agree, I assume Pt had recent hardware removal?  No notes from [place].  Please obtain.  As usual post op pain management per surgeon

## 2012-12-22 ENCOUNTER — Other Ambulatory Visit: Payer: Self-pay

## 2012-12-23 ENCOUNTER — Other Ambulatory Visit: Payer: Self-pay | Admitting: Physical Medicine & Rehabilitation

## 2012-12-26 ENCOUNTER — Telehealth: Payer: Self-pay | Admitting: *Deleted

## 2012-12-26 MED ORDER — METHYLPREDNISOLONE 4 MG PO KIT: PACK | ORAL | Status: DC

## 2012-12-26 NOTE — Telephone Encounter (Signed)
I spoke with Mr Larry Randall and he is still having a lot of pain in his leg. Per Dr Wynn Banker note I wil call him in a Medrol Dosepak to [facility] [place].

## 2013-01-06 ENCOUNTER — Ambulatory Visit (HOSPITAL_BASED_OUTPATIENT_CLINIC_OR_DEPARTMENT_OTHER): Payer: Worker's Compensation | Admitting: Physical Medicine & Rehabilitation

## 2013-01-06 ENCOUNTER — Encounter: Payer: Self-pay | Admitting: Physical Medicine & Rehabilitation

## 2013-01-06 ENCOUNTER — Encounter: Payer: Worker's Compensation | Attending: Physical Medicine & Rehabilitation

## 2013-01-06 VITALS — BP 164/96 | HR 95 | Resp 16 | Ht 71.0 in | Wt 210.0 lb

## 2013-01-06 DIAGNOSIS — G5790 Unspecified mononeuropathy of unspecified lower limb: Secondary | ICD-10-CM | POA: Insufficient documentation

## 2013-01-06 DIAGNOSIS — G8921 Chronic pain due to trauma: Secondary | ICD-10-CM

## 2013-01-06 DIAGNOSIS — G90529 Complex regional pain syndrome I of unspecified lower limb: Secondary | ICD-10-CM | POA: Insufficient documentation

## 2013-01-06 DIAGNOSIS — M79609 Pain in unspecified limb: Secondary | ICD-10-CM | POA: Insufficient documentation

## 2013-01-06 DIAGNOSIS — R209 Unspecified disturbances of skin sensation: Secondary | ICD-10-CM

## 2013-01-06 DIAGNOSIS — Y838 Other surgical procedures as the cause of abnormal reaction of the patient, or of later complication, without mention of misadventure at the time of the procedure: Secondary | ICD-10-CM | POA: Insufficient documentation

## 2013-01-06 DIAGNOSIS — G90521 Complex regional pain syndrome I of right lower limb: Secondary | ICD-10-CM

## 2013-01-06 MED ORDER — LIDOCAINE 5 % EX OINT: TOPICAL_OINTMENT | Freq: Three times a day (TID) | CUTANEOUS | Status: DC

## 2013-01-06 MED ORDER — GABAPENTIN 300 MG PO CAPS: 300.0000 mg | ORAL_CAPSULE | Freq: Four times a day (QID) | ORAL | Status: DC

## 2013-01-06 NOTE — Progress Notes (Signed)
Subjective:    Patient ID: Larry Randall, male    DOB: 1950-05-14, 62 y.o.   MRN: [id number] HPI  DOI: 11/08/2011  Patient ID: Larry Randall, male DOB: 14-Apr-1950, 62 y.o. MRN: [id number]  Left tibial pilon fracture, right calcaneal fracture sustained in a work related injury above. Required open reduction internal fixation with lateral plate and screws of the right calcaneal fracture. There was a lateral plate and screws of the left tibial fracture. Did not require inpatient rehabilitation. Received home health physical therapy and is now in outpatient physical therapy. Doing aquatic therapy with water treadmill walking. He is currently walking 15 minutes forward and vitamins backwards. She is out of the [employer]. Reviewed orthopedic notes. Evidence of good fracture healing. No hardware complications.  HPI  Right ankle hardware removal July 29,2014 , left ankle hardware removal Sept 23,2014   HPI Flareup of left foot and ankle pain after her left ankle hardware removal on September 23. Was reevaluated by orthopedic surgery and sent for venous Doppler. This is negative for deep venous thrombosis   Pain Inventory Average Pain 5 Pain Right Now 4 My pain is sharp, stabbing and aching  In the last 24 hours, has pain interfered with the following? General activity 8 Relation with others 8 Enjoyment of life 5 What TIME of day is your pain at its worst? morning, day and evening Sleep (in general) Fair  Pain is worse with: walking, standing and some activites Pain improves with: rest and heat/ice Relief from Meds: 5  Mobility how many minutes can you walk? 45 ability to climb steps?  yes do you drive?  yes Do you have any goals in this area?  yes  Function employed # of hrs/week 48+  Neuro/Psych trouble walking depression anxiety  Prior Studies Any changes since last visit?  yes  Physicians involved in your care Any changes since last visit?  no   Family  History  Problem Relation Age of Onset  . Heart disease Father   . Colon cancer Neg Hx   . Esophageal cancer Neg Hx   . Rectal cancer Neg Hx   . Stomach cancer Neg Hx    History   Social History  . Marital Status: Divorced    Spouse Name: N/A    Number of Children: 3  . Years of Education: N/A   Social History Main Topics  . Smoking status: Former Smoker -- 1.00 packs/day for 30 years    Types: Cigarettes    Quit date: 09/02/1994  . Smokeless tobacco: Never Used  . Alcohol Use: No  . Drug Use: No  . Sexual Activity: None   Other Topics Concern  . None   Social History Narrative  . None   Past Surgical History  Procedure Laterality Date  . Tonsillectomy and adenoidectomy  CHILD  . Extracorporeal shock wave lithotripsy  05-02-2009  . Transurethral resection of prostate  X2   YRS AGO    BEFORE 1996  . Coronary angioplasty with stent placement  1996    PER PT X3 STENTS (BARE METAL)  . Transurethral resection of prostate  09/07/2011    Procedure: TRANSURETHRAL RESECTION OF THE PROSTATE WITH GYRUS INSTRUMENTS;  Surgeon: Marcine Matar, MD;  Location: [facility];  Service: Urology;  Laterality: N/A;  2 HRS NEEDS A BED  gyrus loop       . External fixation leg  11/09/2011    Procedure: EXTERNAL FIXATION LEG;  Surgeon: Toni Arthurs,  MD;  Location: [facility] OR;  Service: Orthopedics;  Laterality: Left;  . Orif calcaneous fracture  11/12/2011    Procedure: OPEN REDUCTION INTERNAL FIXATION (ORIF) CALCANEOUS FRACTURE;  Surgeon: Toni Arthurs, MD;  Location: [facility] OR;  Service: Orthopedics;  Laterality: Right;  ORIF RIGHT CALCANEOUS FRACTURE  . Orif tibia fracture  11/17/2011    Procedure: OPEN REDUCTION INTERNAL FIXATION (ORIF) TIBIA FRACTURE;  Surgeon: Toni Arthurs, MD;  Location: [facility] OR;  Service: Orthopedics;  Laterality: Left;  REMOVAL OF EXFIX, ORIF LEFT TIBIAL PILON FRACTURE   Past Medical History  Diagnosis Date  . Anxiety   . Arthritis   . GERD (gastroesophageal  reflux disease)   . Hyperlipemia   . CAD (coronary artery disease) FOLLOWED PCP  DR Barnetta Chapel  [facility])    PT DENIES CARDIAC SYMPTOMS  . History of MI (myocardial infarction) 1996--  S/P ANGIOPLASTY X3 STENTS  . S/P primary angioplasty with coronary stent X3 STENTS  1996  POST MI    PER PT BARE METAL STENTS  . H/O hiatal hernia   . History of CHF (congestive heart failure)   . History of gastric ulcer   . Atypical rash OF MOUTH -- PER PT UNKNOWN ETIOLOGY    RX MOUTHWASH BID-  PT STATES POSSIBLE FROM GERD  . History of kidney stones   . Depression   . Colon polyp    BP 164/96  Pulse 95  Resp 16  Ht 5\' 11"  (1.803 m)  Wt 210 lb (95.255 kg)  BMI 29.30 kg/m2  SpO2 96%     Review of Systems  Constitutional: Positive for unexpected weight change.  Musculoskeletal: Positive for gait problem.  Psychiatric/Behavioral: Positive for dysphoric mood. The patient is nervous/anxious.   All other systems reviewed and are negative.       Objective:   Physical Exam  Musculoskeletal:       Right ankle: He exhibits decreased range of motion. He exhibits no swelling, no ecchymosis and no deformity. Tenderness. Lateral malleolus tenderness found. Achilles tendon normal.       Left ankle: He exhibits decreased range of motion. He exhibits no swelling, no ecchymosis and no deformity. No tenderness. Achilles tendon normal.   Decreased sensation first dorsal web space of the left foot Decreased sensation to pinprick entire dorsum of foot on the right as well as little toe  Motor strength right foot 4 minus ankle dorsiflexion plantar flexion 3 minus toe flexion-extension Motor strength left foot 4/5 ankle dorsiflexion and plantarflexion as well as toe flexion and extension Pain inhibition on the left side.  Ambulates without evidence of toe drag or knee instability Mood and affect are appropriate         Assessment & Plan:  1. Complex regional pain syndrome1  although majority of symptoms follow a tibial nerve distribution. 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 Postoperative pain in the left foot and ankle. Some of this is musculoskeletal secondary to scarring and some of this is probably neuropathic but will need to evaluate with EMG Increased gabapentin 300mg  1tab QID but nerve pain increased,  Continue lidocaine ointment 5% 3 times per day to both feet Patient to followup with orthopedic ankle and foot surgeon to discuss possible hardware removal  Continue physical therapy  Light duty work restrictions per Dr Victorino Dike  No ladder climbing Restrict work to 8 hrs per day 5 days  per week  No further blocks or PT recommended  Discussed findings and treatment plan with the patient as well as his case manager Noralee Chars facts [phone]   Return to clinic 3 weeks for EMG of the left foot to evaluate for peroneal neuropathy

## 2013-01-06 NOTE — Patient Instructions (Signed)
Assessment & Plan:  1. Complex regional pain syndrome1 although majority of symptoms follow a tibial nerve distribution. 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 Postoperative pain in the left foot and ankle. Some of this is musculoskeletal secondary to scarring and some of this is probably neuropathic but will need to evaluate with EMG Increased gabapentin 300mg  1tab QID but nerve pain increased,  Continue lidocaine ointment 5% 3 times per day to both feet Patient to followup with orthopedic ankle and foot surgeon to discuss possible hardware removal  Continue physical therapy  Light duty work restrictions per Dr Victorino Dike  No ladder climbing Restrict work to 8 hrs per day 5 days per week  No further blocks or PT recommended  Discussed findings with the patient as well as his case manager Noralee Chars facts [phone]   Return to clinic 3 weeks for EMG of the left foot to evaluate for peroneal neuropathy

## 2013-02-17 ENCOUNTER — Ambulatory Visit (HOSPITAL_BASED_OUTPATIENT_CLINIC_OR_DEPARTMENT_OTHER): Payer: Worker's Compensation | Admitting: Physical Medicine & Rehabilitation

## 2013-02-17 ENCOUNTER — Encounter: Payer: Self-pay | Admitting: Physical Medicine & Rehabilitation

## 2013-02-17 ENCOUNTER — Encounter: Payer: Worker's Compensation | Attending: Physical Medicine & Rehabilitation

## 2013-02-17 VITALS — BP 168/91 | HR 94 | Resp 14 | Ht 71.0 in | Wt 213.6 lb

## 2013-02-17 DIAGNOSIS — E785 Hyperlipidemia, unspecified: Secondary | ICD-10-CM | POA: Diagnosis not present

## 2013-02-17 DIAGNOSIS — G90529 Complex regional pain syndrome I of unspecified lower limb: Secondary | ICD-10-CM | POA: Insufficient documentation

## 2013-02-17 DIAGNOSIS — I252 Old myocardial infarction: Secondary | ICD-10-CM | POA: Insufficient documentation

## 2013-02-17 DIAGNOSIS — IMO0002 Reserved for concepts with insufficient information to code with codable children: Secondary | ICD-10-CM

## 2013-02-17 DIAGNOSIS — I2510 Atherosclerotic heart disease of native coronary artery without angina pectoris: Secondary | ICD-10-CM | POA: Diagnosis not present

## 2013-02-17 DIAGNOSIS — S346XXS Injury of peripheral nerve(s) at abdomen, lower back and pelvis level, sequela: Secondary | ICD-10-CM

## 2013-02-17 DIAGNOSIS — K219 Gastro-esophageal reflux disease without esophagitis: Secondary | ICD-10-CM | POA: Insufficient documentation

## 2013-02-17 DIAGNOSIS — R209 Unspecified disturbances of skin sensation: Secondary | ICD-10-CM

## 2013-02-17 DIAGNOSIS — S8412XS Injury of peroneal nerve at lower leg level, left leg, sequela: Secondary | ICD-10-CM

## 2013-02-17 NOTE — Progress Notes (Signed)
EMG performed 02/17/2013.  See EMG report under media tab.  Right peroneal neuropathy (nerve contusion) expect good recovery over the next 1-2 mo.   gabapentin 331m 1tab QID but nerve pain increased,  Continue lidocaine ointment 5% 3 times per day to both feet  Not at [facility] Met with pt and case manager CAundra DubinRN to discuss results and plan

## 2013-02-17 NOTE — Patient Instructions (Signed)
Agree with work restrictions by Dr. Doran Durand  Continue Gabapentin 300mg  4 times a day  Cont.  Lidocaine gel to feet twice a day  Not at [place]  Return 1 month to discuss tapering gabapentin

## 2013-03-20 ENCOUNTER — Ambulatory Visit (HOSPITAL_BASED_OUTPATIENT_CLINIC_OR_DEPARTMENT_OTHER): Payer: Worker's Compensation | Admitting: Physical Medicine & Rehabilitation

## 2013-03-20 ENCOUNTER — Encounter: Payer: Worker's Compensation | Attending: Physical Medicine & Rehabilitation

## 2013-03-20 ENCOUNTER — Encounter: Payer: Self-pay | Admitting: Physical Medicine & Rehabilitation

## 2013-03-20 VITALS — BP 158/87 | HR 82 | Resp 14 | Ht 71.0 in | Wt 217.0 lb

## 2013-03-20 DIAGNOSIS — Z860100 Personal history of colon polyps, unspecified: Secondary | ICD-10-CM | POA: Insufficient documentation

## 2013-03-20 DIAGNOSIS — G90529 Complex regional pain syndrome I of unspecified lower limb: Secondary | ICD-10-CM | POA: Insufficient documentation

## 2013-03-20 DIAGNOSIS — I252 Old myocardial infarction: Secondary | ICD-10-CM | POA: Insufficient documentation

## 2013-03-20 DIAGNOSIS — I2510 Atherosclerotic heart disease of native coronary artery without angina pectoris: Secondary | ICD-10-CM | POA: Insufficient documentation

## 2013-03-20 DIAGNOSIS — F411 Generalized anxiety disorder: Secondary | ICD-10-CM | POA: Insufficient documentation

## 2013-03-20 DIAGNOSIS — F329 Major depressive disorder, single episode, unspecified: Secondary | ICD-10-CM | POA: Insufficient documentation

## 2013-03-20 DIAGNOSIS — G8921 Chronic pain due to trauma: Secondary | ICD-10-CM

## 2013-03-20 DIAGNOSIS — I509 Heart failure, unspecified: Secondary | ICD-10-CM | POA: Insufficient documentation

## 2013-03-20 DIAGNOSIS — R209 Unspecified disturbances of skin sensation: Secondary | ICD-10-CM | POA: Insufficient documentation

## 2013-03-20 DIAGNOSIS — E785 Hyperlipidemia, unspecified: Secondary | ICD-10-CM | POA: Insufficient documentation

## 2013-03-20 DIAGNOSIS — S8410XA Injury of peroneal nerve at lower leg level, unspecified leg, initial encounter: Secondary | ICD-10-CM

## 2013-03-20 DIAGNOSIS — G609 Hereditary and idiopathic neuropathy, unspecified: Secondary | ICD-10-CM | POA: Insufficient documentation

## 2013-03-20 DIAGNOSIS — Z87891 Personal history of nicotine dependence: Secondary | ICD-10-CM | POA: Insufficient documentation

## 2013-03-20 DIAGNOSIS — M25579 Pain in unspecified ankle and joints of unspecified foot: Secondary | ICD-10-CM | POA: Insufficient documentation

## 2013-03-20 DIAGNOSIS — Z87442 Personal history of urinary calculi: Secondary | ICD-10-CM | POA: Insufficient documentation

## 2013-03-20 DIAGNOSIS — K219 Gastro-esophageal reflux disease without esophagitis: Secondary | ICD-10-CM | POA: Insufficient documentation

## 2013-03-20 DIAGNOSIS — F3289 Other specified depressive episodes: Secondary | ICD-10-CM | POA: Insufficient documentation

## 2013-03-20 NOTE — Patient Instructions (Signed)
1.  left deep peroneal distal neuropathy, improved sensation. Overall good prognosis for this over the next 1-2 months should it back to baseline. 2. Reflex sympathetic dystrophy of the lower limb appears to be affecting both lower limbs. , venous Dopplers negative for any type of clot. No further workup needed. Continue gabapentin but increased 400 mg 4 times per day, lidocaine ointment 2-3 times per day as needed #3. Musculoskeletal pain related to ankle fusion. Recommend diclofenac gel. Recommend regular strength Tylenol 1-2 tablets 4 times per day as needed  Return to clinic one month

## 2013-03-20 NOTE — Progress Notes (Signed)
Subjective:    Patient ID: Larry Randall, male    DOB: [birth year], 63 y.o.   MRN: [id number]  HPI Permanent restrictions per Dr Doran Durand, with PPD.  Has seen Dr. Mayer Camel for rating, no change in rating.  50% in each ankle.  Patient still using lidocaine appointment with some benefit Has not tried diclofenac gel Also has aching pain, not trying any over-the-counter medications at this time. We discussed Tylenol   Pain Inventory Average Pain 5 Pain Right Now 5 My pain is sharp, burning and aching  In the last 24 hours, has pain interfered with the following? General activity 6 Relation with others 6 Enjoyment of life 8 What TIME of day is your pain at its worst? morning, day and evening Sleep (in general) Fair  Pain is worse with: walking, standing and some activites Pain improves with: rest, heat/ice, therapy/exercise and medication Relief from Meds: 5  Mobility walk without assistance how many minutes can you walk? 30 ability to climb steps?  yes do you drive?  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  no  Physicians involved in your care Any changes since last visit?  no   Family History  Problem Relation Age of Onset  . Heart disease Father   . Colon cancer Neg Hx   . Esophageal cancer Neg Hx   . Rectal cancer Neg Hx   . Stomach cancer Neg Hx    History   Social History  . Marital Status: Divorced    Spouse Name: N/A    Number of Children: 3  . Years of Education: N/A   Social History Main Topics  . Smoking status: Former Smoker -- 1.00 packs/day for 30 years    Types: Cigarettes    Quit date: 09/02/1994  . Smokeless tobacco: Never Used  . Alcohol Use: No  . Drug Use: No  . Sexual Activity: None   Other Topics Concern  . None   Social History Narrative  . None   Past Surgical History  Procedure Laterality Date  .  Tonsillectomy and adenoidectomy  CHILD  . Extracorporeal shock wave lithotripsy  05-02-2009  . Transurethral resection of prostate  X2   YRS AGO    BEFORE 1996  . Coronary angioplasty with stent placement  1996    PER PT X3 STENTS (BARE METAL)  . Transurethral resection of prostate  09/07/2011    Procedure: TRANSURETHRAL RESECTION OF THE PROSTATE WITH GYRUS INSTRUMENTS;  Surgeon: Franchot Gallo, MD;  Location[facility];  Service: Urology;  Laterality: N/A;  2 HRS NEEDS A BED  gyrus loop       . External fixation leg  11/09/2011    Procedure: EXTERNAL FIXATION LEG;  Surgeon: Wylene Simmer, MD;  Location: [facility];  Service: Orthopedics;  Laterality: Left;  . Orif calcaneous fracture  11/12/2011    Procedure: OPEN REDUCTION INTERNAL FIXATION (ORIF) CALCANEOUS FRACTURE;  Surgeon: Wylene Simmer, MD;  Location: [place];  Service: Orthopedics;  Laterality: Right;  ORIF RIGHT CALCANEOUS FRACTURE  . Orif tibia fracture  11/17/2011    Procedure: OPEN REDUCTION INTERNAL FIXATION (ORIF) TIBIA FRACTURE;  Surgeon: Wylene Simmer, MD;  Location: [place];  Service: Orthopedics;  Laterality: Left;  REMOVAL OF EXFIX, ORIF LEFT TIBIAL PILON FRACTURE   Past Medical History  Diagnosis Date  . Anxiety   . Arthritis   . GERD (gastroesophageal reflux  disease)   . Hyperlipemia   . CAD (coronary artery disease) FOLLOWED PCP  DR Georga Bora  [facility])    PT DENIES CARDIAC SYMPTOMS  . History of MI (myocardial infarction) 1996--  S/P ANGIOPLASTY X3 STENTS  . S/P primary angioplasty with coronary stent X3 STENTS  1996  POST MI    PER PT BARE METAL STENTS  . H/O hiatal hernia   . History of CHF (congestive heart failure)   . History of gastric ulcer   . Atypical rash OF MOUTH -- PER PT UNKNOWN ETIOLOGY    RX MOUTHWASH BID-  PT STATES POSSIBLE FROM GERD  . History of kidney stones   . Depression   . Colon polyp    BP 158/87  Pulse 82  Resp 14  Ht 5\' 11"  (1.803 m)  Wt 217 lb  (98.431 kg)  BMI 30.28 kg/m2  SpO2 95%  Opioid Risk Score:   Fall Risk Score: Moderate Fall Risk (6-13 points) (patient educated hangout declined)   Review of Systems  Constitutional: Positive for unexpected weight change.  Musculoskeletal: Positive for gait problem.  Neurological: Positive for numbness.  Psychiatric/Behavioral: Positive for dysphoric mood. The patient is nervous/anxious.   All other systems reviewed and are negative.       Objective:   Physical Exam Sensation intact to pinprick in bilateral feet 4 minus/5 left EHL5/5 right EHL 4 minus bilateral ankle dorsiflexor plantar flexor Swelling in the left leg and foot, mild erythema Walks with No Evidence of Toe Drag or Knee Instability       Assessment & Plan:  1.  left deep peroneal distal neuropathy, improved sensation. Overall good prognosis for this over the next 1-2 months should it back to baseline. 2. Reflex sympathetic dystrophy of the lower limb appears to be affecting both lower limbs. , venous Dopplers negative for any type of clot. No further workup needed. Continue gabapentin but increased 400 mg 4 times per day, lidocaine ointment 2-3 times per day as needed #3. Musculoskeletal pain related to ankle fusion. Recommend diclofenac gel. Recommend regular strength Tylenol 1-2 tablets 4 times per day as needed  Return to clinic one month [place], [place] [phone] fax [employer] Senior medical case manager [address] [place]

## 2013-04-14 ENCOUNTER — Other Ambulatory Visit: Payer: Self-pay | Admitting: Physical Medicine & Rehabilitation

## 2013-04-21 ENCOUNTER — Encounter: Payer: Self-pay | Admitting: Physical Medicine & Rehabilitation

## 2013-04-21 ENCOUNTER — Encounter: Payer: Worker's Compensation | Attending: Physical Medicine & Rehabilitation

## 2013-04-21 ENCOUNTER — Ambulatory Visit (HOSPITAL_BASED_OUTPATIENT_CLINIC_OR_DEPARTMENT_OTHER): Payer: Worker's Compensation | Admitting: Physical Medicine & Rehabilitation

## 2013-04-21 VITALS — BP 170/87 | HR 91 | Resp 14 | Ht 71.0 in | Wt 212.0 lb

## 2013-04-21 DIAGNOSIS — G90529 Complex regional pain syndrome I of unspecified lower limb: Secondary | ICD-10-CM | POA: Insufficient documentation

## 2013-04-21 DIAGNOSIS — E785 Hyperlipidemia, unspecified: Secondary | ICD-10-CM | POA: Insufficient documentation

## 2013-04-21 DIAGNOSIS — I2510 Atherosclerotic heart disease of native coronary artery without angina pectoris: Secondary | ICD-10-CM | POA: Insufficient documentation

## 2013-04-21 DIAGNOSIS — Z87891 Personal history of nicotine dependence: Secondary | ICD-10-CM | POA: Insufficient documentation

## 2013-04-21 DIAGNOSIS — I252 Old myocardial infarction: Secondary | ICD-10-CM | POA: Insufficient documentation

## 2013-04-21 DIAGNOSIS — G8921 Chronic pain due to trauma: Secondary | ICD-10-CM

## 2013-04-21 DIAGNOSIS — K219 Gastro-esophageal reflux disease without esophagitis: Secondary | ICD-10-CM | POA: Insufficient documentation

## 2013-04-21 MED ORDER — METHYLPREDNISOLONE 4 MG PO KIT: PACK | ORAL | Status: DC

## 2013-04-21 NOTE — Patient Instructions (Signed)
diagnosed left foot and ankle reflex sympathetic dystrophy. This is the same condition you had in your right foot and ankle. We will start with Medrol Dosepak and then refer you to an anesthesiologist for  lumbar sympathetic blocks on the left side  Restriction No squatting Otherwise may do full-time job

## 2013-04-21 NOTE — Progress Notes (Signed)
Subjective:    Patient ID: Larry Randall, male    DOB: 07-Nov-1950, 63 y.o.   MRN: [id number]  HPI Permanent restrictions per Dr Doran Durand, with PPD. Has seen Dr. Mayer Camel for rating, no change in rating. 50% in each ankle. Patient reports increased swelling in the left ankle as well as increased pain. Has had Doppler venous ultrasound to  evaluate which was negative. Patient increased pain with standing Pain is mainly on lateral aspect of the ankle.  Patient also notes increasing difficulty with squatting, needs assist with getting back up again  Pain Inventory Average Pain 7 Pain Right Now 7 My pain is sharp, burning, stabbing and aching  In the last 24 hours, has pain interfered with the following? General activity 8 Relation with others 7 Enjoyment of life 9 What TIME of day is your pain at its worst? constant Sleep (in general) Poor  Pain is worse with: walking, standing and some activites Pain improves with: rest, heat/ice, therapy/exercise and medication Relief from Meds: 5  Mobility how many minutes can you walk? 30 or less ability to climb steps?  yes do you drive?  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  no  Physicians involved in your care Any changes since last visit?  no   Family History  Problem Relation Age of Onset  . Heart disease Father   . Colon cancer Neg Hx   . Esophageal cancer Neg Hx   . Rectal cancer Neg Hx   . Stomach cancer Neg Hx    History   Social History  . Marital Status: Divorced    Spouse Name: N/A    Number of Children: 3  . Years of Education: N/A   Social History Main Topics  . Smoking status: Former Smoker -- 1.00 packs/day for 30 years    Types: Cigarettes    Quit date: 09/02/1994  . Smokeless tobacco: Never Used  . Alcohol Use: No  . Drug Use: No  . Sexual Activity: None   Other Topics  Concern  . None   Social History Narrative  . None   Past Surgical History  Procedure Laterality Date  . Tonsillectomy and adenoidectomy  CHILD  . Extracorporeal shock wave lithotripsy  05-02-2009  . Transurethral resection of prostate  X2   YRS AGO    BEFORE 1996  . Coronary angioplasty with stent placement  1996    PER PT X3 STENTS (BARE METAL)  . Transurethral resection of prostate  09/07/2011    Procedure: TRANSURETHRAL RESECTION OF THE PROSTATE WITH GYRUS INSTRUMENTS;  Surgeon: Franchot Gallo, MD;  [facility];  Service: Urology;  Laterality: N/A;  2 HRS NEEDS A BED  gyrus loop       . External fixation leg  11/09/2011    Procedure: EXTERNAL FIXATION LEG;  Surgeon: Wylene Simmer, MD;  Location: [place];  Service: Orthopedics;  Laterality: Left;  . Orif calcaneous fracture  11/12/2011    Procedure: OPEN REDUCTION INTERNAL FIXATION (ORIF) CALCANEOUS FRACTURE;  Surgeon: Wylene Simmer, MD;  Location: [place];  Service: Orthopedics;  Laterality: Right;  ORIF RIGHT CALCANEOUS FRACTURE  . Orif tibia fracture  11/17/2011    Procedure: OPEN REDUCTION INTERNAL FIXATION (ORIF) TIBIA FRACTURE;  Surgeon: Wylene Simmer, MD;  Location: [place];  Service: Orthopedics;  Laterality: Left;  REMOVAL OF EXFIX, ORIF LEFT TIBIAL PILON FRACTURE  Past Medical History  Diagnosis Date  . Anxiety   . Arthritis   . GERD (gastroesophageal reflux disease)   . Hyperlipemia   . CAD (coronary artery disease) FOLLOWED PCP  DR Georga Bora  [facility])    PT DENIES CARDIAC SYMPTOMS  . History of MI (myocardial infarction) 1996--  S/P ANGIOPLASTY X3 STENTS  . S/P primary angioplasty with coronary stent X3 STENTS  1996  POST MI    PER PT BARE METAL STENTS  . H/O hiatal hernia   . History of CHF (congestive heart failure)   . History of gastric ulcer   . Atypical rash OF MOUTH -- PER PT UNKNOWN ETIOLOGY    RX MOUTHWASH BID-  PT STATES POSSIBLE FROM GERD  . History of kidney  stones   . Depression   . Colon polyp    BP 170/87  Pulse 91  Resp 14  Ht 5\' 11"  (1.803 m)  Wt 212 lb (96.163 kg)  BMI 29.58 kg/m2  SpO2 96%  Opioid Risk Score:   Fall Risk Score: Moderate Fall Risk (6-13 points) (patient educated handout given)   Review of Systems  Constitutional: Positive for unexpected weight change.  Cardiovascular: Positive for leg swelling.  Musculoskeletal: Positive for gait problem.  Neurological: Positive for weakness and numbness.  Psychiatric/Behavioral: Positive for dysphoric mood.  All other systems reviewed and are negative.       Objective:   Physical Exam  Left foot and ankle with swelling. Also swelling about the boot. There is no erythema in the leg mild erythema in the left ankle. No swelling in the right ankle no erythema in the right ankle. Pedal and posterior tibial pulses are normal No hyperhidrosis or hypo-hyper emesis. There is pain with light palpation on the lateral aspect of the left foot and ankle.       Assessment & Plan:  1. Reflex sympathetic dystrophy left lower extremity. This happened post removal of hardware. Because the patient had excellent response to lumbar sympathetic injections in the right lower ext,  I recommend that he has lumbar sympathetic injections by Dr. Isaiah Blakes in the near future.    Add no squatting restriction  Aundra Dubin RN, [place] [phone] fax  [employer]  Senior medical case manager  [place] [place]

## 2013-05-26 DIAGNOSIS — G500 Trigeminal neuralgia: Secondary | ICD-10-CM | POA: Insufficient documentation

## 2013-05-26 HISTORY — DX: Trigeminal neuralgia: G50.0

## 2013-06-02 ENCOUNTER — Ambulatory Visit: Payer: Self-pay | Admitting: Physical Medicine & Rehabilitation

## 2013-06-27 ENCOUNTER — Encounter: Payer: Worker's Compensation | Attending: Physical Medicine & Rehabilitation

## 2013-06-27 ENCOUNTER — Ambulatory Visit: Payer: Self-pay | Admitting: Physical Medicine & Rehabilitation

## 2013-06-27 DIAGNOSIS — F329 Major depressive disorder, single episode, unspecified: Secondary | ICD-10-CM | POA: Insufficient documentation

## 2013-06-27 DIAGNOSIS — F411 Generalized anxiety disorder: Secondary | ICD-10-CM | POA: Insufficient documentation

## 2013-06-27 DIAGNOSIS — E785 Hyperlipidemia, unspecified: Secondary | ICD-10-CM | POA: Insufficient documentation

## 2013-06-27 DIAGNOSIS — Z87891 Personal history of nicotine dependence: Secondary | ICD-10-CM | POA: Insufficient documentation

## 2013-06-27 DIAGNOSIS — K219 Gastro-esophageal reflux disease without esophagitis: Secondary | ICD-10-CM | POA: Insufficient documentation

## 2013-06-27 DIAGNOSIS — R209 Unspecified disturbances of skin sensation: Secondary | ICD-10-CM | POA: Insufficient documentation

## 2013-06-27 DIAGNOSIS — G90529 Complex regional pain syndrome I of unspecified lower limb: Secondary | ICD-10-CM | POA: Insufficient documentation

## 2013-06-27 DIAGNOSIS — I509 Heart failure, unspecified: Secondary | ICD-10-CM | POA: Insufficient documentation

## 2013-06-27 DIAGNOSIS — Z87442 Personal history of urinary calculi: Secondary | ICD-10-CM | POA: Insufficient documentation

## 2013-06-27 DIAGNOSIS — Z860100 Personal history of colon polyps, unspecified: Secondary | ICD-10-CM | POA: Insufficient documentation

## 2013-06-27 DIAGNOSIS — G609 Hereditary and idiopathic neuropathy, unspecified: Secondary | ICD-10-CM | POA: Insufficient documentation

## 2013-06-27 DIAGNOSIS — M25579 Pain in unspecified ankle and joints of unspecified foot: Secondary | ICD-10-CM | POA: Insufficient documentation

## 2013-06-27 DIAGNOSIS — I252 Old myocardial infarction: Secondary | ICD-10-CM | POA: Insufficient documentation

## 2013-06-27 DIAGNOSIS — I2510 Atherosclerotic heart disease of native coronary artery without angina pectoris: Secondary | ICD-10-CM | POA: Insufficient documentation

## 2013-06-27 DIAGNOSIS — F3289 Other specified depressive episodes: Secondary | ICD-10-CM | POA: Insufficient documentation

## 2013-07-24 ENCOUNTER — Ambulatory Visit: Payer: Self-pay | Admitting: Physical Medicine & Rehabilitation

## 2013-07-24 ENCOUNTER — Encounter: Payer: PRIVATE HEALTH INSURANCE | Attending: Physical Medicine & Rehabilitation

## 2013-08-14 ENCOUNTER — Emergency Department (HOSPITAL_COMMUNITY): Payer: PRIVATE HEALTH INSURANCE

## 2013-08-14 ENCOUNTER — Emergency Department (HOSPITAL_COMMUNITY)
Admission: EM | Admit: 2013-08-14 | Discharge: 2013-08-14 | Disposition: A | Discharge status: Home / Self Care | Payer: PRIVATE HEALTH INSURANCE | Attending: Emergency Medicine | Admitting: Emergency Medicine

## 2013-08-14 ENCOUNTER — Encounter (HOSPITAL_COMMUNITY): Payer: Self-pay | Admitting: Emergency Medicine

## 2013-08-14 DIAGNOSIS — M545 Low back pain, unspecified: Secondary | ICD-10-CM | POA: Diagnosis not present

## 2013-08-14 DIAGNOSIS — Z860100 Personal history of colon polyps, unspecified: Secondary | ICD-10-CM | POA: Insufficient documentation

## 2013-08-14 DIAGNOSIS — N3090 Cystitis, unspecified without hematuria: Secondary | ICD-10-CM | POA: Diagnosis not present

## 2013-08-14 DIAGNOSIS — M129 Arthropathy, unspecified: Secondary | ICD-10-CM | POA: Insufficient documentation

## 2013-08-14 DIAGNOSIS — F329 Major depressive disorder, single episode, unspecified: Secondary | ICD-10-CM | POA: Insufficient documentation

## 2013-08-14 DIAGNOSIS — N179 Acute kidney failure, unspecified: Secondary | ICD-10-CM | POA: Diagnosis not present

## 2013-08-14 DIAGNOSIS — Z87891 Personal history of nicotine dependence: Secondary | ICD-10-CM | POA: Insufficient documentation

## 2013-08-14 DIAGNOSIS — I2510 Atherosclerotic heart disease of native coronary artery without angina pectoris: Secondary | ICD-10-CM | POA: Diagnosis not present

## 2013-08-14 DIAGNOSIS — Z8719 Personal history of other diseases of the digestive system: Secondary | ICD-10-CM | POA: Diagnosis not present

## 2013-08-14 DIAGNOSIS — Z9861 Coronary angioplasty status: Secondary | ICD-10-CM | POA: Insufficient documentation

## 2013-08-14 DIAGNOSIS — Z79899 Other long term (current) drug therapy: Secondary | ICD-10-CM | POA: Insufficient documentation

## 2013-08-14 DIAGNOSIS — I509 Heart failure, unspecified: Secondary | ICD-10-CM | POA: Insufficient documentation

## 2013-08-14 DIAGNOSIS — N3091 Cystitis, unspecified with hematuria: Secondary | ICD-10-CM

## 2013-08-14 DIAGNOSIS — F3289 Other specified depressive episodes: Secondary | ICD-10-CM | POA: Insufficient documentation

## 2013-08-14 DIAGNOSIS — F411 Generalized anxiety disorder: Secondary | ICD-10-CM | POA: Insufficient documentation

## 2013-08-14 DIAGNOSIS — Z792 Long term (current) use of antibiotics: Secondary | ICD-10-CM | POA: Insufficient documentation

## 2013-08-14 DIAGNOSIS — R319 Hematuria, unspecified: Secondary | ICD-10-CM | POA: Diagnosis present

## 2013-08-14 DIAGNOSIS — Z7982 Long term (current) use of aspirin: Secondary | ICD-10-CM | POA: Diagnosis not present

## 2013-08-14 DIAGNOSIS — I252 Old myocardial infarction: Secondary | ICD-10-CM | POA: Insufficient documentation

## 2013-08-14 DIAGNOSIS — Z87442 Personal history of urinary calculi: Secondary | ICD-10-CM | POA: Insufficient documentation

## 2013-08-14 HISTORY — DX: Cystitis, unspecified with hematuria: N30.91

## 2013-08-14 LAB — CBC WITH DIFFERENTIAL/PLATELET
Basophils Absolute: 0 10*3/uL (ref 0.0–0.1)
Basophils Relative: 0 % (ref 0–1)
Eosinophils Absolute: 0.2 10*3/uL (ref 0.0–0.7)
Eosinophils Relative: 1 % (ref 0–5)
HEMATOCRIT: 46.2 % (ref 39.0–52.0)
Hemoglobin: 16.7 g/dL (ref 13.0–17.0)
LYMPHS PCT: 13 % (ref 12–46)
Lymphs Abs: 1.9 10*3/uL (ref 0.7–4.0)
MCH: 31.9 pg (ref 26.0–34.0)
MCHC: 36.1 g/dL — ABNORMAL HIGH (ref 30.0–36.0)
MCV: 88.3 fL (ref 78.0–100.0)
MONO ABS: 1.6 10*3/uL — AB (ref 0.1–1.0)
Monocytes Relative: 11 % (ref 3–12)
NEUTROS ABS: 11.2 10*3/uL — AB (ref 1.7–7.7)
Neutrophils Relative %: 75 % (ref 43–77)
PLATELETS: 301 10*3/uL (ref 150–400)
RBC: 5.23 MIL/uL (ref 4.22–5.81)
RDW: 14.9 % (ref 11.5–15.5)
WBC: 14.9 10*3/uL — AB (ref 4.0–10.5)

## 2013-08-14 LAB — PROTIME-INR
INR: 0.99 (ref 0.00–1.49)
Prothrombin Time: 13.1 seconds (ref 11.6–15.2)

## 2013-08-14 LAB — BASIC METABOLIC PANEL
BUN: 20 mg/dL (ref 6–23)
CALCIUM: 7.9 mg/dL — AB (ref 8.4–10.5)
CO2: 22 mEq/L (ref 19–32)
Chloride: 106 mEq/L (ref 96–112)
Creatinine, Ser: 1.66 mg/dL — ABNORMAL HIGH (ref 0.50–1.35)
GFR calc Af Amer: 49 mL/min — ABNORMAL LOW (ref >90)
GFR, EST NON AFRICAN AMERICAN: 42 mL/min — AB (ref >90)
GLUCOSE: 82 mg/dL (ref 70–99)
Potassium: 3.8 mEq/L (ref 3.7–5.3)
Sodium: 140 mEq/L (ref 137–147)

## 2013-08-14 LAB — URINALYSIS, ROUTINE W REFLEX MICROSCOPIC
GLUCOSE, UA: NEGATIVE mg/dL
KETONES UR: 40 mg/dL — AB
Nitrite: POSITIVE — AB
Specific Gravity, Urine: 1.015 (ref 1.005–1.030)
Urobilinogen, UA: 1 mg/dL (ref 0.0–1.0)
pH: 5 (ref 5.0–8.0)

## 2013-08-14 LAB — COMPREHENSIVE METABOLIC PANEL
ALT: 45 U/L (ref 0–53)
AST: 37 U/L (ref 0–37)
Albumin: 4 g/dL (ref 3.5–5.2)
Alkaline Phosphatase: 130 U/L — ABNORMAL HIGH (ref 39–117)
BILIRUBIN TOTAL: 0.9 mg/dL (ref 0.3–1.2)
BUN: 22 mg/dL (ref 6–23)
CHLORIDE: 98 meq/L (ref 96–112)
CO2: 23 meq/L (ref 19–32)
Calcium: 9.2 mg/dL (ref 8.4–10.5)
Creatinine, Ser: 1.92 mg/dL — ABNORMAL HIGH (ref 0.50–1.35)
GFR calc Af Amer: 41 mL/min — ABNORMAL LOW (ref >90)
GFR, EST NON AFRICAN AMERICAN: 35 mL/min — AB (ref >90)
Glucose, Bld: 130 mg/dL — ABNORMAL HIGH (ref 70–99)
Potassium: 3.6 mEq/L — ABNORMAL LOW (ref 3.7–5.3)
Sodium: 136 mEq/L — ABNORMAL LOW (ref 137–147)
Total Protein: 7.2 g/dL (ref 6.0–8.3)

## 2013-08-14 LAB — URINE MICROSCOPIC-ADD ON

## 2013-08-14 LAB — CK: Total CK: 62 U/L (ref 7–232)

## 2013-08-14 LAB — LIPASE, BLOOD: Lipase: 36 U/L (ref 11–59)

## 2013-08-14 MED ORDER — ONDANSETRON HCL 4 MG PO TABS: 4.0000 mg | ORAL_TABLET | Freq: Four times a day (QID) | ORAL | Status: DC

## 2013-08-14 MED ORDER — CIPROFLOXACIN HCL 500 MG PO TABS: 500.0000 mg | ORAL_TABLET | Freq: Two times a day (BID) | ORAL | Status: DC

## 2013-08-14 MED ORDER — DEXTROSE 5 % IV SOLN
1.0000 g | Freq: Once | INTRAVENOUS | Status: AC
  Administered 2013-08-14: 1 g via INTRAVENOUS
  Filled 2013-08-14: qty 10

## 2013-08-14 MED ORDER — SODIUM CHLORIDE 0.9 % IV BOLUS (SEPSIS)
1000.0000 mL | Freq: Once | INTRAVENOUS | Status: AC
  Administered 2013-08-14: 1000 mL via INTRAVENOUS

## 2013-08-14 MED ORDER — MORPHINE SULFATE 4 MG/ML IJ SOLN
4.0000 mg | Freq: Once | INTRAMUSCULAR | Status: AC
  Administered 2013-08-14: 4 mg via INTRAVENOUS
  Filled 2013-08-14: qty 1

## 2013-08-14 MED ORDER — ONDANSETRON HCL 4 MG/2ML IJ SOLN
4.0000 mg | Freq: Once | INTRAMUSCULAR | Status: AC
  Administered 2013-08-14: 4 mg via INTRAVENOUS
  Filled 2013-08-14: qty 2

## 2013-08-14 MED ORDER — HYDROCODONE-ACETAMINOPHEN 5-325 MG PO TABS: 2.0000 | ORAL_TABLET | ORAL | Status: DC | PRN

## 2013-08-14 NOTE — ED Notes (Signed)
Pt states blood in urine since Thursday evening.  Pt states hx of prostate problems.  Was seen by MD on Friday.  Pt placed on meds Friday and has had no improvement.  Pt states groin painful.

## 2013-08-14 NOTE — ED Notes (Signed)
Provided pt with 268mL of Coke

## 2013-08-14 NOTE — ED Provider Notes (Signed)
CSN: [id number]     Arrival date & time 08/14/13  3419 History   First MD Initiated Contact with Patient 08/14/13 [phone]     Chief Complaint  Patient presents with  . Hematuria  . Abdominal Pain  . Flank Pain     (Consider location/radiation/quality/duration/timing/severity/associated sxs/prior Treatment) HPI Comments: Patient presents with a 5 day history of gross hematuria every time he urinates. He endorses pain with urination, bilateral groin pain and low back pain. He denies any nausea or vomiting. He denies any fever. He is not any anticoagulation. He saw someone in the urology office 3 days ago and was given Flomax. He reports symptoms have not improved. He endorses feeling generally weak and lightheaded. No chest pain or shortness of breath. No testicular pain. No blood in his stool.  He has a history of partial prostate resection by Dr. Diona Fanti.  The history is provided by the patient.    Past Medical History  Diagnosis Date  . Anxiety   . Arthritis   . GERD (gastroesophageal reflux disease)   . Hyperlipemia   . CAD (coronary artery disease) FOLLOWED PCP  DR Georga Bora  [facility])    PT DENIES CARDIAC SYMPTOMS  . History of MI (myocardial infarction) 1996--  S/P ANGIOPLASTY X3 STENTS  . S/P primary angioplasty with coronary stent X3 STENTS  1996  POST MI    PER PT BARE METAL STENTS  . H/O hiatal hernia   . History of CHF (congestive heart failure)   . History of gastric ulcer   . Atypical rash OF MOUTH -- PER PT UNKNOWN ETIOLOGY    RX MOUTHWASH BID-  PT STATES POSSIBLE FROM GERD  . History of kidney stones   . Depression   . Colon polyp    Past Surgical History  Procedure Laterality Date  . Tonsillectomy and adenoidectomy  CHILD  . Extracorporeal shock wave lithotripsy  05-02-2009  . Transurethral resection of prostate  X2   YRS AGO    BEFORE 1996  . Coronary angioplasty with stent placement  1996    PER PT X3 STENTS (BARE METAL)  .  Transurethral resection of prostate  09/07/2011    Procedure: TRANSURETHRAL RESECTION OF THE PROSTATE WITH GYRUS INSTRUMENTS;  Surgeon: Franchot Gallo, MD;  Location: [facility];  Service: Urology;  Laterality: N/A;  2 HRS NEEDS A BED  gyrus loop       . External fixation leg  11/09/2011    Procedure: EXTERNAL FIXATION LEG;  Surgeon: Wylene Simmer, MD;  Location: [place];  Service: Orthopedics;  Laterality: Left;  . Orif calcaneous fracture  11/12/2011    Procedure: OPEN REDUCTION INTERNAL FIXATION (ORIF) CALCANEOUS FRACTURE;  Surgeon: Wylene Simmer, MD;  Location: [place];  Service: Orthopedics;  Laterality: Right;  ORIF RIGHT CALCANEOUS FRACTURE  . Orif tibia fracture  11/17/2011    Procedure: OPEN REDUCTION INTERNAL FIXATION (ORIF) TIBIA FRACTURE;  Surgeon: Wylene Simmer, MD;  Location: [place];  Service: Orthopedics;  Laterality: Left;  REMOVAL OF EXFIX, ORIF LEFT TIBIAL PILON FRACTURE   Family History  Problem Relation Age of Onset  . Heart disease Father   . Colon cancer Neg Hx   . Esophageal cancer Neg Hx   . Rectal cancer Neg Hx   . Stomach cancer Neg Hx    History  Substance Use Topics  . Smoking status: Former Smoker -- 1.00 packs/day for 30 years    Types: Cigarettes    Quit date: 09/02/1994  .  Smokeless tobacco: Never Used  . Alcohol Use: No    Review of Systems  Constitutional: Positive for activity change, appetite change and fatigue. Negative for fever.  HENT: Negative for congestion and rhinorrhea.   Respiratory: Negative for cough, chest tightness and shortness of breath.   Cardiovascular: Negative for chest pain.  Gastrointestinal: Positive for abdominal pain. Negative for nausea and vomiting.  Genitourinary: Positive for dysuria, hematuria, flank pain and difficulty urinating. Negative for testicular pain.  Musculoskeletal: Positive for arthralgias, back pain and myalgias.  Skin: Negative for rash.  Neurological: Negative for dizziness, weakness and  headaches.   A complete 10 system review of systems was obtained and all systems are negative except as noted in the HPI and PMH.     Allergies  Review of patient's allergies indicates no known allergies.  Home Medications   Prior to Admission medications   Medication Sig Start Date End Date Taking? Authorizing Aashka Salomone  aspirin EC 81 MG tablet Take 81 mg by mouth daily.   Yes Historical Zafiro Routson, MD  bifidobacterium infantis (ALIGN) capsule Take 1 capsule by mouth daily. 02/01/12  Yes Irene Shipper, MD  CALCIUM-MAGNESIUM-VITAMIN D PO Take 1 tablet by mouth daily.   Yes Historical Asyah Candler, MD  cefdinir (OMNICEF) 300 MG capsule Take 300 mg by mouth 2 (two) times daily.   Yes Historical Shenell Rogalski, MD  citalopram (CELEXA) 40 MG tablet Take 40 mg by mouth daily.  11/24/12  Yes Historical Orrie Lascano, MD  dutasteride (AVODART) 0.5 MG capsule Take 0.5 mg by mouth daily.   Yes Historical Kristl Morioka, MD  FIBER FORMULA PO Take 1 capsule by mouth daily.   Yes Historical Jamarquis Crull, MD  fluticasone Asencion Islam) 50 MCG/ACT nasal spray  04/16/12  Yes Historical Quy Lotts, MD  gabapentin (NEURONTIN) 600 MG tablet Take 600 mg by mouth 4 (four) times daily.   Yes Historical Lauralee Waters, MD  hydrochlorothiazide (HYDRODIURIL) 25 MG tablet  02/13/13  Yes Historical Yarissa Reining, MD  HYDROcodone-acetaminophen (NORCO/VICODIN) 5-325 MG per tablet Take 1 tablet by mouth at bedtime.   Yes Historical Jermario Kalmar, MD  losartan (COZAAR) 50 MG tablet  11/24/12  Yes Historical Abria Vannostrand, MD  OVER THE COUNTER MEDICATION Inject 1 each into the skin daily. Every Tuesday.   Yes Historical Kathleen Likins, MD  rosuvastatin (CRESTOR) 10 MG tablet Take 10 mg by mouth daily.   Yes Historical Dreydon Cardenas, MD  tamsulosin (FLOMAX) 0.4 MG CAPS capsule Take 0.4 mg by mouth at bedtime.   Yes Historical Evona Westra, MD  thyroid (ARMOUR) 60 MG tablet Take 60 mg by mouth daily.   Yes Historical Boyd Litaker, MD  ciprofloxacin (CIPRO) 500 MG tablet Take 1 tablet (500 mg total)  by mouth every 12 (twelve) hours. 08/14/13   Ezequiel Essex, MD  HYDROcodone-acetaminophen (NORCO/VICODIN) 5-325 MG per tablet Take 2 tablets by mouth every 4 (four) hours as needed. 08/14/13   Ezequiel Essex, MD  ondansetron (ZOFRAN) 4 MG tablet Take 1 tablet (4 mg total) by mouth every 6 (six) hours. 08/14/13   Ezequiel Essex, MD  testosterone cypionate (DEPOTESTOTERONE CYPIONATE) 200 MG/ML injection  02/02/13   Historical Vernis Cabacungan, MD   BP 125/74  Pulse 78  Temp(Src) 97.9 F (36.6 C) (Oral)  Resp 18  SpO2 94% Physical Exam  Nursing note and vitals reviewed. Constitutional: He is oriented to person, place, and time. He appears well-developed and well-nourished. No distress.  HENT:  Head: Normocephalic and atraumatic.  Mouth/Throat: Oropharynx is clear and moist. No oropharyngeal exudate.  Eyes: Conjunctivae and EOM are normal.  Pupils are equal, round, and reactive to light.  Neck: Normal range of motion. Neck supple.  No meningismus.  Cardiovascular: Normal rate, regular rhythm, normal heart sounds and intact distal pulses.   No murmur heard. Pulmonary/Chest: Effort normal and breath sounds normal. No respiratory distress.  Abdominal: Soft. There is no tenderness. There is no rebound and no guarding.  Genitourinary:  Some gross blood in underwear. No testicular pain  Musculoskeletal: Normal range of motion. He exhibits no edema and no tenderness.  Paraspinal lumbar pain no midline pain   Neurological: He is alert and oriented to person, place, and time. No cranial nerve deficit. He exhibits normal muscle tone. Coordination normal.  No ataxia on finger to nose bilaterally. No pronator drift. 5/5 strength throughout. CN 2-12 intact. Negative Romberg. Equal grip strength. Sensation intact. Gait is normal.   Skin: Skin is warm.  Psychiatric: He has a normal mood and affect. His behavior is normal.    ED Course  Procedures (including critical care time) Labs Review Labs Reviewed   URINALYSIS, ROUTINE W REFLEX MICROSCOPIC - Abnormal; Notable for the following:    Color, Urine RED (*)    APPearance TURBID (*)    Hgb urine dipstick LARGE (*)    Bilirubin Urine LARGE (*)    Ketones, ur 40 (*)    Protein, ur >300 (*)    Nitrite POSITIVE (*)    Leukocytes, UA LARGE (*)    All other components within normal limits  CBC WITH DIFFERENTIAL - Abnormal; Notable for the following:    WBC 14.9 (*)    MCHC 36.1 (*)    Neutro Abs 11.2 (*)    Monocytes Absolute 1.6 (*)    All other components within normal limits  COMPREHENSIVE METABOLIC PANEL - Abnormal; Notable for the following:    Sodium 136 (*)    Potassium 3.6 (*)    Glucose, Bld 130 (*)    Creatinine, Ser 1.92 (*)    Alkaline Phosphatase 130 (*)    GFR calc non Af Amer 35 (*)    GFR calc Af Amer 41 (*)    All other components within normal limits  BASIC METABOLIC PANEL - Abnormal; Notable for the following:    Creatinine, Ser 1.66 (*)    Calcium 7.9 (*)    GFR calc non Af Amer 42 (*)    GFR calc Af Amer 49 (*)    All other components within normal limits  URINE CULTURE  LIPASE, BLOOD  CK  PROTIME-INR  URINE MICROSCOPIC-ADD ON    Imaging Review Ct Abdomen Pelvis Wo Contrast  08/14/2013   CLINICAL DATA:  Two week history of left flank and groin discomfort with hematuria becoming worse ; history of prostatitis and urinary tract stones  EXAM: CT ABDOMEN AND PELVIS WITHOUT CONTRAST  TECHNIQUE: Multidetector CT imaging of the abdomen and pelvis was performed following the standard protocol without IV contrast.  COMPARISON:  Noncontrast CT scan of the abdomen pelvis of December 02, 2012.  FINDINGS: The prostate gland is enlarged and decreases urinary bladder lumen. There is radiodense material within the bladder lumen as well as a small amount of gas and the Foley catheter balloon. No calcified bladder stones. There is parenchymal calcification within the prostate gland.  The kidneys exhibit no obstruction. In the  lateral aspect of the midpole of the left kidney there is a 1.8 cm diameter stable hypodensity. Medially in the midpole on the left there is a 2.3 cm stable hypodensity. The  perinephric fat is within the limits of normal. No calcified kidney or ureteral stones are demonstrated.  There are fatty infiltrative changes of the liver. The gallbladder, pancreas, spleen, nondistended stomach, adrenal glands, and abdominal aorta exhibit no acute abnormalities. Are scattered sigmoid diverticula without evidence of acute diverticulitis. There is no bowel abnormality otherwise. The appendix is normal. There is no free pelvic fluid. The lumbar spine and bony pelvis are unremarkable. The lung bases are clear  IMPRESSION: 1. There is enlargement of the prostate gland which reduces the urinary bladder lumen. This may reflect known prostatitis though underlying hypertrophy or neoplasm is likely present. There is acute blood within the urinary bladder possibly from traumatic catheterization. No bladder stones are evident. 2. No acute abnormality of the upper urinary tracts is demonstrated. 3. Fatty infiltrative changes of the liver. No acute bowel abnormality.   Electronically Signed   By: David  Martinique   On: 08/14/2013 11:02     EKG Interpretation None      MDM   Final diagnoses:  Hemorrhagic cystitis  Acute kidney injury   5 day history of gross hematuria with low back pain and pain with urination. Some dizziness and lightheadedness.  Patient with gross hematuria. Leukocyte esterase and nitrite positive. Probably hemorrhagic cystitis. Creatinine elevated 1.9 from baseline 0.7. WBC 15 Rocephin and IVF given in the ED. Patient will be discharged on cipro.  Foley catheter placed with bloody urine obtained, clears with irrigation.  CT scan shows enlarged prostate gland with blood in the urinary bladder. No bladder stones. No kidney stones.  Case discussed with Dr. Jeffie Pollock of urology. He agrees to treat for  hemorrhagic cystitis. Urine is cleared after irrigation. Foley catheter will be discontinued to avoid clogging it.  On recheck, patient creatinine has improved to 1.6 from 1.9. Pain is controlled.   Still not as his baseline.  No obstruction on CT scan. Patient will need followup for recheck of creatinine this week with his urologist. He is hesitant to go home. Discussed with hospitalist Dr. Wendee Beavers who feels patient's creatinine is down trending and can be discharged for followup as an outpatient. Patient's family in agreement. Followup with urology this week. Catheter removed before discharge.  BP 125/74  Pulse 78  Temp(Src) 97.9 F (36.6 C) (Oral)  Resp 18  SpO2 94%    Ezequiel Essex, MD 08/14/13 1624

## 2013-08-14 NOTE — Discharge Instructions (Signed)
Urinary Tract Infection Followup with Dr. Diona Fanti in the next 2 days. Your need a recheck of her kidney function to make sure it is getting better. Take antibiotics as prescribed. Return to the ED if you develop new or worsening symptoms. Urinary tract infections (UTIs) can develop anywhere along your urinary tract. Your urinary tract is your body's drainage system for removing wastes and extra water. Your urinary tract includes two kidneys, two ureters, a bladder, and a urethra. Your kidneys are a pair of bean-shaped organs. Each kidney is about the size of your fist. They are located below your ribs, one on each side of your spine. CAUSES Infections are caused by microbes, which are microscopic organisms, including fungi, viruses, and bacteria. These organisms are so small that they can only be seen through a microscope. Bacteria are the microbes that most commonly cause UTIs. SYMPTOMS  Symptoms of UTIs may vary by age and gender of the patient and by the location of the infection. Symptoms in young women typically include a frequent and intense urge to urinate and a painful, burning feeling in the bladder or urethra during urination. Older women and men are more likely to be tired, shaky, and weak and have muscle aches and abdominal pain. A fever may mean the infection is in your kidneys. Other symptoms of a kidney infection include pain in your back or sides below the ribs, nausea, and vomiting. DIAGNOSIS To diagnose a UTI, your caregiver will ask you about your symptoms. Your caregiver also will ask to provide a urine sample. The urine sample will be tested for bacteria and white blood cells. White blood cells are made by your body to help fight infection. TREATMENT  Typically, UTIs can be treated with medication. Because most UTIs are caused by a bacterial infection, they usually can be treated with the use of antibiotics. The choice of antibiotic and length of treatment depend on your symptoms and  the type of bacteria causing your infection. HOME CARE INSTRUCTIONS  If you were prescribed antibiotics, take them exactly as your caregiver instructs you. Finish the medication even if you feel better after you have only taken some of the medication.  Drink enough water and fluids to keep your urine clear or pale yellow.  Avoid caffeine, tea, and carbonated beverages. They tend to irritate your bladder.  Empty your bladder often. Avoid holding urine for long periods of time.  Empty your bladder before and after sexual intercourse.  After a bowel movement, women should cleanse from front to back. Use each tissue only once. SEEK MEDICAL CARE IF:   You have back pain.  You develop a fever.  Your symptoms do not begin to resolve within 3 days. SEEK IMMEDIATE MEDICAL CARE IF:   You have severe back pain or lower abdominal pain.  You develop chills.  You have nausea or vomiting.  You have continued burning or discomfort with urination. MAKE SURE YOU:   Understand these instructions.  Will watch your condition.  Will get help right away if you are not doing well or get worse. Document Released: 11/12/2004 Document Revised: 08/04/2011 Document Reviewed: 03/13/2011 [facility] Patient Information 2015 [place], [place]. This information is not intended to replace advice given to you by your health care provider. Make sure you discuss any questions you have with your health care provider.

## 2013-08-14 NOTE — Consult Note (Signed)
[facility] Medical Consultation  Larry Randall PZW:[id number] DOB: April 14, 1950 DOA: 08/14/2013 PCP: Tinnie Gens   Requesting physician: Rancour Date of consultation: 08/14/13 Reason for consultation: Hemorrhagic cystitis   Impression/Recommendations Active Problems:    Hemorrhagic cystitis - Per my discussion with ED physician agree with outpatient evaluation with Urologist for further evaluation and recommendations. - Pt has positive nitrite and large leukocytes, suspect problem most likely 2ary to infectious etiology.  Would consider ciprofloxacin on discharge. - Recommend obtaining urine culture.  Acute renal failure - CT reports no obstruction - improved after IVF's in the ED.  I will followup again tomorrow. Please contact me if I can be of assistance in the meanwhile. Thank you for this consultation.  Chief Complaint: 5 days of gross hematuria  HPI:  Patient is a 63 year old with history of partial prostate resection by Dr. Diona Fanti. Who presents to the ED complaining of 5 days a gross hematuria. The hematuria has been persistent since onset. He denies any fevers or chills. Was seen at the urologist office 3 days ago and given Flomax. Nothing he is aware of makes it better. Since the problem was persistent patient decided to present to the ED for further evaluation.  Review of Systems:  14 point review of systems reviewed and negative unless otherwise mentioned above  Past Medical History  Diagnosis Date  . Anxiety   . Arthritis   . GERD (gastroesophageal reflux disease)   . Hyperlipemia   . CAD (coronary artery disease) FOLLOWED PCP  DR Georga Bora  [facility])    PT DENIES CARDIAC SYMPTOMS  . History of MI (myocardial infarction) 1996--  S/P ANGIOPLASTY X3 STENTS  . S/P primary angioplasty with coronary stent X3 STENTS  1996  POST MI    PER PT BARE METAL STENTS  . H/O hiatal hernia   . History of CHF (congestive heart failure)   . History  of gastric ulcer   . Atypical rash OF MOUTH -- PER PT UNKNOWN ETIOLOGY    RX MOUTHWASH BID-  PT STATES POSSIBLE FROM GERD  . History of kidney stones   . Depression   . Colon polyp    Past Surgical History  Procedure Laterality Date  . Tonsillectomy and adenoidectomy  CHILD  . Extracorporeal shock wave lithotripsy  05-02-2009  . Transurethral resection of prostate  X2   YRS AGO    BEFORE 1996  . Coronary angioplasty with stent placement  1996    PER PT X3 STENTS (BARE METAL)  . Transurethral resection of prostate  09/07/2011    Procedure: TRANSURETHRAL RESECTION OF THE PROSTATE WITH GYRUS INSTRUMENTS;  Surgeon: Franchot Gallo, MD;  Location: [facility];  Service: Urology;  Laterality: N/A;  2 HRS NEEDS A BED  gyrus loop       . External fixation leg  11/09/2011    Procedure: EXTERNAL FIXATION LEG;  Surgeon: Wylene Simmer, MD;  Location: [place];  Service: Orthopedics;  Laterality: Left;  . Orif calcaneous fracture  11/12/2011    Procedure: OPEN REDUCTION INTERNAL FIXATION (ORIF) CALCANEOUS FRACTURE;  Surgeon: Wylene Simmer, MD;  Location: [place];  Service: Orthopedics;  Laterality: Right;  ORIF RIGHT CALCANEOUS FRACTURE  . Orif tibia fracture  11/17/2011    Procedure: OPEN REDUCTION INTERNAL FIXATION (ORIF) TIBIA FRACTURE;  Surgeon: Wylene Simmer, MD;  Location: [place];  Service: Orthopedics;  Laterality: Left;  REMOVAL OF EXFIX, ORIF LEFT TIBIAL PILON FRACTURE   Social History:  reports that he quit  smoking about 18 years ago. His smoking use included Cigarettes. He has a 30 pack-year smoking history. He has never used smokeless tobacco. He reports that he does not drink alcohol or use illicit drugs.  No Known Allergies Family History  Problem Relation Age of Onset  . Heart disease Father   . Colon cancer Neg Hx   . Esophageal cancer Neg Hx   . Rectal cancer Neg Hx   . Stomach cancer Neg Hx     Prior to Admission medications   Medication Sig Start Date End Date  Taking? Authorizing Provider  aspirin EC 81 MG tablet Take 81 mg by mouth daily.   Yes Historical Provider, MD  bifidobacterium infantis (ALIGN) capsule Take 1 capsule by mouth daily. 02/01/12  Yes Irene Shipper, MD  CALCIUM-MAGNESIUM-VITAMIN D PO Take 1 tablet by mouth daily.   Yes Historical Provider, MD  cefdinir (OMNICEF) 300 MG capsule Take 300 mg by mouth 2 (two) times daily.   Yes Historical Provider, MD  citalopram (CELEXA) 40 MG tablet Take 40 mg by mouth daily.  11/24/12  Yes Historical Provider, MD  dutasteride (AVODART) 0.5 MG capsule Take 0.5 mg by mouth daily.   Yes Historical Provider, MD  FIBER FORMULA PO Take 1 capsule by mouth daily.   Yes Historical Provider, MD  fluticasone Asencion Islam) 50 MCG/ACT nasal spray  04/16/12  Yes Historical Provider, MD  gabapentin (NEURONTIN) 600 MG tablet Take 600 mg by mouth 4 (four) times daily.   Yes Historical Provider, MD  hydrochlorothiazide (HYDRODIURIL) 25 MG tablet  02/13/13  Yes Historical Provider, MD  HYDROcodone-acetaminophen (NORCO/VICODIN) 5-325 MG per tablet Take 1 tablet by mouth at bedtime.   Yes Historical Provider, MD  losartan (COZAAR) 50 MG tablet  11/24/12  Yes Historical Provider, MD  OVER THE COUNTER MEDICATION Inject 1 each into the skin daily. Every Tuesday.   Yes Historical Provider, MD  rosuvastatin (CRESTOR) 10 MG tablet Take 10 mg by mouth daily.   Yes Historical Provider, MD  tamsulosin (FLOMAX) 0.4 MG CAPS capsule Take 0.4 mg by mouth at bedtime.   Yes Historical Provider, MD  thyroid (ARMOUR) 60 MG tablet Take 60 mg by mouth daily.   Yes Historical Provider, MD  testosterone cypionate (DEPOTESTOTERONE CYPIONATE) 200 MG/ML injection  02/02/13   Historical Provider, MD   Physical Exam: Blood pressure 110/68, pulse 70, temperature 98 F (36.7 C), temperature source Oral, resp. rate 18, SpO2 99.00%. Filed Vitals:   08/14/13 1400  BP: 110/68  Pulse: 70  Temp:   Resp:      General:  Pt in NAD, alert and  awake  Eyes: EOMI, non icteric  ENT: normal exterior appearance  Neck: supple, no goiter  Cardiovascular: no cyanosis, warm extremities  Respiratory: CTA BL, no wheezes  Abdomen:  ND, NT  Skin: no obvious rashes  Musculoskeletal: no cyanosis or clubbing  Psychiatric: mood and affect appropriate  Neurologic: answers questions appropriately  Labs on Admission:  Basic Metabolic Panel:  Recent Labs Lab 08/14/13 0934 08/14/13 1328  NA 136* 140  K 3.6* 3.8  CL 98 106  CO2 23 22  GLUCOSE 130* 82  BUN 22 20  CREATININE 1.92* 1.66*  CALCIUM 9.2 7.9*   Liver Function Tests:  Recent Labs Lab 08/14/13 0934  AST 37  ALT 45  ALKPHOS 130*  BILITOT 0.9  PROT 7.2  ALBUMIN 4.0    Recent Labs Lab 08/14/13 0934  LIPASE 36   No results found for this  basename: AMMONIA,  in the last 168 hours CBC:  Recent Labs Lab 08/14/13 0934  WBC 14.9*  NEUTROABS 11.2*  HGB 16.7  HCT 46.2  MCV 88.3  PLT 301   Cardiac Enzymes:  Recent Labs Lab 08/14/13 0934  CKTOTAL 62   BNP: No components found with this basename: POCBNP,  CBG: No results found for this basename: GLUCAP,  in the last 168 hours  Radiological Exams on Admission: Ct Abdomen Pelvis Wo Contrast  08/14/2013   CLINICAL DATA:  Two week history of left flank and groin discomfort with hematuria becoming worse ; history of prostatitis and urinary tract stones  EXAM: CT ABDOMEN AND PELVIS WITHOUT CONTRAST  TECHNIQUE: Multidetector CT imaging of the abdomen and pelvis was performed following the standard protocol without IV contrast.  COMPARISON:  Noncontrast CT scan of the abdomen pelvis of December 02, 2012.  FINDINGS: The prostate gland is enlarged and decreases urinary bladder lumen. There is radiodense material within the bladder lumen as well as a small amount of gas and the Foley catheter balloon. No calcified bladder stones. There is parenchymal calcification within the prostate gland.  The kidneys exhibit no  obstruction. In the lateral aspect of the midpole of the left kidney there is a 1.8 cm diameter stable hypodensity. Medially in the midpole on the left there is a 2.3 cm stable hypodensity. The perinephric fat is within the limits of normal. No calcified kidney or ureteral stones are demonstrated.  There are fatty infiltrative changes of the liver. The gallbladder, pancreas, spleen, nondistended stomach, adrenal glands, and abdominal aorta exhibit no acute abnormalities. Are scattered sigmoid diverticula without evidence of acute diverticulitis. There is no bowel abnormality otherwise. The appendix is normal. There is no free pelvic fluid. The lumbar spine and bony pelvis are unremarkable. The lung bases are clear  IMPRESSION: 1. There is enlargement of the prostate gland which reduces the urinary bladder lumen. This may reflect known prostatitis though underlying hypertrophy or neoplasm is likely present. There is acute blood within the urinary bladder possibly from traumatic catheterization. No bladder stones are evident. 2. No acute abnormality of the upper urinary tracts is demonstrated. 3. Fatty infiltrative changes of the liver. No acute bowel abnormality.   Electronically Signed   By: David  Martinique   On: 08/14/2013 11:02     Time spent: > 40 minutes  Velvet Bathe [facility] Pager [phone]  If 7PM-7AM, please contact night-coverage [email or web address] Password [facility] 08/14/2013, 3:23 PM

## 2013-08-14 NOTE — ED Notes (Addendum)
Phlebotomy @ beside

## 2013-08-15 LAB — URINE CULTURE
CULTURE: NO GROWTH
Colony Count: NO GROWTH

## 2013-08-25 ENCOUNTER — Other Ambulatory Visit: Payer: Self-pay | Admitting: Urology

## 2013-08-25 ENCOUNTER — Encounter (HOSPITAL_BASED_OUTPATIENT_CLINIC_OR_DEPARTMENT_OTHER): Payer: Self-pay | Admitting: *Deleted

## 2013-08-28 ENCOUNTER — Encounter (HOSPITAL_BASED_OUTPATIENT_CLINIC_OR_DEPARTMENT_OTHER): Payer: Self-pay | Admitting: *Deleted

## 2013-08-28 NOTE — Progress Notes (Signed)
08/28/13 1253  OBSTRUCTIVE SLEEP APNEA  Have you ever been diagnosed with sleep apnea through a sleep study? No  Do you snore loudly (loud enough to be heard through closed doors)?  1  Do you often feel tired, fatigued, or sleepy during the daytime? 0  Has anyone observed you stop breathing during your sleep? 1  Do you have, or are you being treated for high blood pressure? 1  BMI more than 35 kg/m2? 0  Age over 65 years old? 1  Neck circumference greater than 40 cm/16 inches? 1  Gender: 1  Obstructive Sleep Apnea Score 6  Score 4 or greater  Results sent to PCP

## 2013-08-28 NOTE — Progress Notes (Addendum)
NPO AFTER MN. ARRIVE AT 0745. NEEDS ISTAT 8  AND EKG. WILL TAKE GABAPENTIN, ARMOUR THYROID, AND DO FLONASE NASAL SPRAY AM DOS W/ SIPS OF WATER. IF NEEDED MAY TAKE PAIN/ NAUSEA RX.

## 2013-08-30 NOTE — H&P (Signed)
Urology History and Physical Exam  CC: Blood in urine   HPI: 63 year old male presents for anesthetic cystoscopy and TURBT. He recently presented with gross hematuria, and was found to have a left bladder neck lesion. He presents for TURBT.  PMH: Past Medical History  Diagnosis Date  . Anxiety   . GERD (gastroesophageal reflux disease)   . Hyperlipemia   . History of MI (myocardial infarction) 1996--  S/P ANGIOPLASTY X3 STENTS  . S/P primary angioplasty with coronary stent X3 STENTS  1996  POST MI    PER PT BARE METAL STENTS  . H/O hiatal hernia   . History of CHF (congestive heart failure)   . History of gastric ulcer   . Atypical rash OF MOUTH -- PER PT UNKNOWN ETIOLOGY    RX MOUTHWASH BID-  PT STATES POSSIBLE FROM GERD  . History of kidney stones   . Depression   . History of colon polyps     2013  . BPH (benign prostatic hypertrophy)   . History of urinary retention   . 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 Sigmoid diverticulosis   . Bladder mass     BLADDER NECK AREA  . Arthritis     LEFT LEG AND ANKLE  . At risk for sleep apnea     STOP-BANG=  6   SENT TO PCP 08-28-2013  . Hypertension   . CAD (coronary artery disease) FOLLOWED PCP  DR Dickey Gave  [facility]    PT DENIES CARDIAC SYMPTOMS    PSH: Past Surgical History  Procedure Laterality Date  . Tonsillectomy and adenoidectomy  CHILD  . Extracorporeal shock wave lithotripsy  05-02-2009  . Transurethral resection of prostate  X2   YRS AGO    BEFORE 1996  . Coronary angioplasty with stent placement  1996    PER PT X3 STENTS (BARE METAL)  . Transurethral resection of prostate  09/07/2011    Procedure: TRANSURETHRAL RESECTION OF THE PROSTATE WITH GYRUS INSTRUMENTS;  Surgeon: Franchot Gallo, MD;  Location: [facility];  Service: Urology;  Laterality: N/A;  . External fixation leg  11/09/2011    Procedure: EXTERNAL FIXATION LEG;   Surgeon: Wylene Simmer, MD;  Location: [place];  Service: Orthopedics;  Laterality: Left;  . Orif calcaneous fracture  11/12/2011    Procedure: OPEN REDUCTION INTERNAL FIXATION (ORIF) CALCANEOUS FRACTURE;  Surgeon: Wylene Simmer, MD;  Location: [place];  Service: Orthopedics;  Laterality: Right;  ORIF RIGHT CALCANEOUS FRACTURE  . Orif tibia fracture  11/17/2011    Procedure: OPEN REDUCTION INTERNAL FIXATION (ORIF) TIBIA FRACTURE;  Surgeon: Wylene Simmer, MD;  Location: [place];  Service: Orthopedics;  Laterality: Left;  REMOVAL OF EXFIX, ORIF LEFT TIBIAL PILON FRACTURE  . Colonoscopy with esophagogastroduodenoscopy (egd)  10-29-2011    Allergies: No Known Allergies  Medications: No prescriptions prior to admission     Social History: History   Social History  . Marital Status: Divorced    Spouse Name: N/A    Number of Children: 3  . Years of Education: N/A   Occupational History  . Not on file.   Social History Main Topics  . Smoking status: Former Smoker -- 1.00 packs/day for 30 years    Types: Cigarettes    Quit date: 09/02/1994  . Smokeless tobacco: Never Used  . Alcohol Use: No  . Drug Use: No  . Sexual Activity:  Not on file   Other Topics Concern  . Not on file   Social History Narrative  . No narrative on file    Family History: Family History  Problem Relation Age of Onset  . Heart disease Father   . Colon cancer Neg Hx   . Esophageal cancer Neg Hx   . Rectal cancer Neg Hx   . Stomach cancer Neg Hx     Review of Systems: Positive: Blod in urine Negative:  A further 10 point review of systems was negative except what is listed in the HPI.                  Physical Exam: @VITALS2 @ Constitutional: Well nourished and well developed . No acute distress.  ENT:. The ears and nose are normal in appearance.  Neck: The appearance of the neck is normal and no neck mass is present.  Pulmonary: No respiratory distress and normal respiratory rhythm and effort.   Cardiovascular: Heart rate and rhythm are normal . No peripheral edema.  Abdomen: The abdomen is soft and nontender. No masses are palpated. No CVA tenderness. No hernias are palpable. No hepatosplenomegaly noted.  Rectal: Rectal exam demonstrates normal sphincter tone, no tenderness and no masses. The prostate has no nodularity and is not tender. The left seminal vesicle is nonpalpable. The right seminal vesicle is nonpalpable. The perineum is normal on inspection.  Genitourinary: Examination of the penis demonstrates no discharge, no masses, no lesions and a normal meatus. The scrotum is without lesions. The right epididymis is palpably normal and non-tender. The left epididymis is palpably normal and non-tender. The right testis is non-tender and without masses. The left testis is non-tender and without masses.  Lymphatics: The femoral and inguinal nodes are not enlarged or tender.  Skin: Normal skin turgor, no visible rash and no visible skin lesions.  Neuro/Psych:. Mood and affect are appropriate.      Studies:  No results found for this basename: HGB, WBC, PLT,  in the last 72 hours  No results found for this basename: NA, K, CL, CO2, BUN, CREATININE, CALCIUM, MAGNESIUM, GFRNONAA, GFRAA,  in the last 72 hours   No results found for this basename: PT, INR, APTT,  in the last 72 hours   No components found with this basename: ABG,     Assessment:  Gross hematuria with bladder lesion  Plan: TURBT

## 2013-08-31 ENCOUNTER — Encounter (HOSPITAL_BASED_OUTPATIENT_CLINIC_OR_DEPARTMENT_OTHER): Payer: PRIVATE HEALTH INSURANCE | Admitting: Anesthesiology

## 2013-08-31 ENCOUNTER — Other Ambulatory Visit: Payer: Self-pay

## 2013-08-31 ENCOUNTER — Observation Stay (HOSPITAL_BASED_OUTPATIENT_CLINIC_OR_DEPARTMENT_OTHER)
Admission: RE | Admit: 2013-08-31 | Discharge: 2013-09-01 | Disposition: A | Discharge status: Home / Self Care | Payer: PRIVATE HEALTH INSURANCE | Source: Ambulatory Visit | Attending: Urology | Admitting: Urology

## 2013-08-31 ENCOUNTER — Encounter (HOSPITAL_COMMUNITY)
Admission: RE | Disposition: A | Discharge status: Home / Self Care | Payer: Self-pay | Source: Ambulatory Visit | Attending: Urology

## 2013-08-31 ENCOUNTER — Ambulatory Visit (HOSPITAL_BASED_OUTPATIENT_CLINIC_OR_DEPARTMENT_OTHER): Payer: PRIVATE HEALTH INSURANCE | Admitting: Anesthesiology

## 2013-08-31 ENCOUNTER — Ambulatory Visit (HOSPITAL_COMMUNITY): Payer: PRIVATE HEALTH INSURANCE

## 2013-08-31 ENCOUNTER — Encounter (HOSPITAL_BASED_OUTPATIENT_CLINIC_OR_DEPARTMENT_OTHER): Payer: Self-pay | Admitting: *Deleted

## 2013-08-31 DIAGNOSIS — N138 Other obstructive and reflux uropathy: Secondary | ICD-10-CM

## 2013-08-31 DIAGNOSIS — I252 Old myocardial infarction: Secondary | ICD-10-CM | POA: Insufficient documentation

## 2013-08-31 DIAGNOSIS — N3289 Other specified disorders of bladder: Principal | ICD-10-CM | POA: Insufficient documentation

## 2013-08-31 DIAGNOSIS — F329 Major depressive disorder, single episode, unspecified: Secondary | ICD-10-CM | POA: Diagnosis not present

## 2013-08-31 DIAGNOSIS — N3020 Other chronic cystitis without hematuria: Secondary | ICD-10-CM | POA: Insufficient documentation

## 2013-08-31 DIAGNOSIS — I2510 Atherosclerotic heart disease of native coronary artery without angina pectoris: Secondary | ICD-10-CM | POA: Diagnosis not present

## 2013-08-31 DIAGNOSIS — Z87891 Personal history of nicotine dependence: Secondary | ICD-10-CM | POA: Diagnosis not present

## 2013-08-31 DIAGNOSIS — N401 Enlarged prostate with lower urinary tract symptoms: Secondary | ICD-10-CM | POA: Diagnosis not present

## 2013-08-31 DIAGNOSIS — I10 Essential (primary) hypertension: Secondary | ICD-10-CM | POA: Diagnosis not present

## 2013-08-31 DIAGNOSIS — E785 Hyperlipidemia, unspecified: Secondary | ICD-10-CM | POA: Diagnosis not present

## 2013-08-31 DIAGNOSIS — Z9079 Acquired absence of other genital organ(s): Secondary | ICD-10-CM | POA: Diagnosis not present

## 2013-08-31 DIAGNOSIS — I509 Heart failure, unspecified: Secondary | ICD-10-CM | POA: Insufficient documentation

## 2013-08-31 DIAGNOSIS — K219 Gastro-esophageal reflux disease without esophagitis: Secondary | ICD-10-CM | POA: Diagnosis not present

## 2013-08-31 DIAGNOSIS — N329 Bladder disorder, unspecified: Secondary | ICD-10-CM | POA: Diagnosis present

## 2013-08-31 DIAGNOSIS — F3289 Other specified depressive episodes: Secondary | ICD-10-CM | POA: Insufficient documentation

## 2013-08-31 HISTORY — PX: TRANSURETHRAL RESECTION OF BLADDER TUMOR WITH GYRUS (TURBT-GYRUS): SHX6458

## 2013-08-31 HISTORY — DX: Diverticulosis of large intestine without perforation or abscess without bleeding: K57.30

## 2013-08-31 HISTORY — DX: Benign prostatic hyperplasia with lower urinary tract symptoms: N40.1

## 2013-08-31 HISTORY — DX: Other obstructive and reflux uropathy: N13.8

## 2013-08-31 HISTORY — DX: Injury of peroneal nerve at lower leg level, left leg, initial encounter: S84.12XA

## 2013-08-31 HISTORY — DX: Other specified personal risk factors, not elsewhere classified: Z91.89

## 2013-08-31 HISTORY — DX: Personal history of colonic polyps: Z86.010

## 2013-08-31 HISTORY — DX: Benign prostatic hyperplasia without lower urinary tract symptoms: N40.0

## 2013-08-31 HISTORY — PX: TRANSURETHRAL RESECTION OF PROSTATE: SHX73

## 2013-08-31 HISTORY — DX: Personal history of other specified conditions: Z87.898

## 2013-08-31 HISTORY — DX: Other specified disorders of bladder: N32.89

## 2013-08-31 HISTORY — PX: CYSTOSCOPY W/ RETROGRADES: SHX1426

## 2013-08-31 HISTORY — DX: Personal history of colon polyps, unspecified: Z86.0100

## 2013-08-31 LAB — POCT I-STAT, CHEM 8
BUN: 13 mg/dL (ref 6–23)
CALCIUM ION: 1.21 mmol/L (ref 1.13–1.30)
CREATININE: 0.8 mg/dL (ref 0.50–1.35)
Chloride: 107 mEq/L (ref 96–112)
Glucose, Bld: 106 mg/dL — ABNORMAL HIGH (ref 70–99)
HCT: 44 % (ref 39.0–52.0)
HEMOGLOBIN: 15 g/dL (ref 13.0–17.0)
POTASSIUM: 3.4 meq/L — AB (ref 3.7–5.3)
Sodium: 143 mEq/L (ref 137–147)
TCO2: 23 mmol/L (ref 0–100)

## 2013-08-31 SURGERY — TRANSURETHRAL RESECTION OF BLADDER TUMOR WITH GYRUS (TURBT-GYRUS)
Anesthesia: General | Site: Prostate

## 2013-08-31 MED ORDER — BELLADONNA ALKALOIDS-OPIUM 16.2-60 MG RE SUPP
1.0000 | Freq: Four times a day (QID) | RECTAL | Status: DC | PRN
  Administered 2013-08-31: 1 via RECTAL
  Filled 2013-08-31 (×2): qty 1

## 2013-08-31 MED ORDER — EPHEDRINE SULFATE 50 MG/ML IJ SOLN
INTRAMUSCULAR | Status: DC | PRN
  Administered 2013-08-31: 5 mg via INTRAVENOUS
  Administered 2013-08-31: 10 mg via INTRAVENOUS

## 2013-08-31 MED ORDER — LOSARTAN POTASSIUM 50 MG PO TABS
100.0000 mg | ORAL_TABLET | Freq: Every day | ORAL | Status: DC
  Administered 2013-09-01: 100 mg via ORAL
  Filled 2013-08-31: qty 2

## 2013-08-31 MED ORDER — GENTAMICIN IN SALINE 1.6-0.9 MG/ML-% IV SOLN
80.0000 mg | INTRAVENOUS | Status: DC
  Administered 2013-08-31: 400 mg via INTRAVENOUS
  Filled 2013-08-31: qty 50

## 2013-08-31 MED ORDER — GABAPENTIN 600 MG PO TABS
600.0000 mg | ORAL_TABLET | Freq: Four times a day (QID) | ORAL | Status: DC
  Filled 2013-08-31 (×3): qty 1

## 2013-08-31 MED ORDER — OXYCODONE HCL 5 MG/5ML PO SOLN
5.0000 mg | Freq: Once | ORAL | Status: DC | PRN
  Filled 2013-08-31: qty 5

## 2013-08-31 MED ORDER — FENTANYL CITRATE 0.05 MG/ML IJ SOLN
INTRAMUSCULAR | Status: DC | PRN
Start: 2013-08-31 — End: 2013-08-31
  Administered 2013-08-31 (×4): 50 ug via INTRAVENOUS

## 2013-08-31 MED ORDER — ONDANSETRON HCL 4 MG/2ML IJ SOLN
INTRAMUSCULAR | Status: DC | PRN
  Administered 2013-08-31: 4 mg via INTRAVENOUS

## 2013-08-31 MED ORDER — MIDAZOLAM HCL 5 MG/5ML IJ SOLN
INTRAMUSCULAR | Status: DC | PRN
  Administered 2013-08-31: 2 mg via INTRAVENOUS

## 2013-08-31 MED ORDER — HYDROMORPHONE HCL PF 1 MG/ML IJ SOLN
0.2500 mg | INTRAMUSCULAR | Status: DC | PRN
  Filled 2013-08-31: qty 1

## 2013-08-31 MED ORDER — LIDOCAINE HCL (CARDIAC) 20 MG/ML IV SOLN
INTRAVENOUS | Status: DC | PRN
  Administered 2013-08-31: 80 mg via INTRAVENOUS

## 2013-08-31 MED ORDER — GABAPENTIN 300 MG PO CAPS
600.0000 mg | ORAL_CAPSULE | Freq: Four times a day (QID) | ORAL | Status: DC
  Administered 2013-08-31 – 2013-09-01 (×5): 600 mg via ORAL
  Filled 2013-08-31 (×7): qty 2

## 2013-08-31 MED ORDER — BELLADONNA ALKALOIDS-OPIUM 16.2-60 MG RE SUPP
RECTAL | Status: AC
  Filled 2013-08-31: qty 1

## 2013-08-31 MED ORDER — DSS 100 MG PO CAPS: 100.0000 mg | ORAL_CAPSULE | Freq: Two times a day (BID) | ORAL | Status: DC

## 2013-08-31 MED ORDER — DEXTROSE-NACL 5-0.45 % IV SOLN
INTRAVENOUS | Status: DC
  Administered 2013-08-31 – 2013-09-01 (×2): via INTRAVENOUS
  Filled 2013-08-31: qty 1000

## 2013-08-31 MED ORDER — DOCUSATE SODIUM 100 MG PO CAPS
100.0000 mg | ORAL_CAPSULE | Freq: Two times a day (BID) | ORAL | Status: DC
  Administered 2013-08-31 – 2013-09-01 (×3): 100 mg via ORAL
  Filled 2013-08-31 (×4): qty 1

## 2013-08-31 MED ORDER — LACTATED RINGERS IV SOLN
INTRAVENOUS | Status: DC
  Administered 2013-08-31 (×2): via INTRAVENOUS
  Filled 2013-08-31: qty 1000

## 2013-08-31 MED ORDER — METHYLENE BLUE 1 % INJ SOLN
INTRAMUSCULAR | Status: DC | PRN
  Administered 2013-08-31: 100 mg via INTRAVENOUS

## 2013-08-31 MED ORDER — HYDROCHLOROTHIAZIDE 25 MG PO TABS
25.0000 mg | ORAL_TABLET | Freq: Every morning | ORAL | Status: DC
  Administered 2013-09-01: 25 mg via ORAL
  Filled 2013-08-31 (×2): qty 1

## 2013-08-31 MED ORDER — KETOROLAC TROMETHAMINE 30 MG/ML IJ SOLN
INTRAMUSCULAR | Status: DC | PRN
  Administered 2013-08-31: 30 mg via INTRAVENOUS

## 2013-08-31 MED ORDER — CITALOPRAM HYDROBROMIDE 40 MG PO TABS
40.0000 mg | ORAL_TABLET | Freq: Every day | ORAL | Status: DC
  Administered 2013-09-01: 40 mg via ORAL
  Filled 2013-08-31 (×2): qty 1

## 2013-08-31 MED ORDER — SODIUM CHLORIDE 0.9 % IR SOLN
Status: DC | PRN
  Administered 2013-08-31: 42000 mL
  Administered 2013-08-31: 1500 mL

## 2013-08-31 MED ORDER — MIDAZOLAM HCL 2 MG/2ML IJ SOLN
INTRAMUSCULAR | Status: AC
  Filled 2013-08-31: qty 2

## 2013-08-31 MED ORDER — MEPERIDINE HCL 25 MG/ML IJ SOLN
6.2500 mg | INTRAMUSCULAR | Status: DC | PRN
  Filled 2013-08-31: qty 1

## 2013-08-31 MED ORDER — PROMETHAZINE HCL 25 MG/ML IJ SOLN
6.2500 mg | INTRAMUSCULAR | Status: DC | PRN
  Filled 2013-08-31: qty 1

## 2013-08-31 MED ORDER — FLUTICASONE PROPIONATE 50 MCG/ACT NA SUSP
1.0000 | Freq: Every morning | NASAL | Status: DC
  Filled 2013-08-31: qty 16

## 2013-08-31 MED ORDER — FLUTICASONE PROPIONATE 50 MCG/ACT NA SUSP
1.0000 | Freq: Every day | NASAL | Status: DC
  Administered 2013-09-01: 1 via NASAL
  Filled 2013-08-31: qty 16

## 2013-08-31 MED ORDER — LEVOFLOXACIN 500 MG PO TABS
500.0000 mg | ORAL_TABLET | Freq: Every day | ORAL | Status: DC
  Administered 2013-09-01: 500 mg via ORAL
  Filled 2013-08-31 (×2): qty 1

## 2013-08-31 MED ORDER — FENTANYL CITRATE 0.05 MG/ML IJ SOLN
INTRAMUSCULAR | Status: AC
  Filled 2013-08-31: qty 4

## 2013-08-31 MED ORDER — PROPOFOL 10 MG/ML IV BOLUS
INTRAVENOUS | Status: DC | PRN
  Administered 2013-08-31: 200 mg via INTRAVENOUS

## 2013-08-31 MED ORDER — LEVOFLOXACIN 500 MG PO TABS: 500.0000 mg | ORAL_TABLET | Freq: Every day | ORAL | Status: DC

## 2013-08-31 MED ORDER — IOHEXOL 350 MG/ML SOLN
INTRAVENOUS | Status: DC | PRN
  Administered 2013-08-31: 20 mL

## 2013-08-31 MED ORDER — ACETAMINOPHEN 10 MG/ML IV SOLN
INTRAVENOUS | Status: DC | PRN
  Administered 2013-08-31: 1000 mg via INTRAVENOUS

## 2013-08-31 MED ORDER — ATORVASTATIN CALCIUM 10 MG PO TABS
10.0000 mg | ORAL_TABLET | Freq: Every day | ORAL | Status: DC
  Filled 2013-08-31 (×3): qty 1

## 2013-08-31 MED ORDER — OXYCODONE HCL 5 MG PO TABS
5.0000 mg | ORAL_TABLET | Freq: Once | ORAL | Status: DC | PRN
  Filled 2013-08-31: qty 1

## 2013-08-31 MED ORDER — THYROID 60 MG PO TABS
60.0000 mg | ORAL_TABLET | Freq: Every day | ORAL | Status: DC
  Administered 2013-09-01: 60 mg via ORAL
  Filled 2013-08-31 (×3): qty 1

## 2013-08-31 MED ORDER — PANTOPRAZOLE SODIUM 40 MG PO TBEC
40.0000 mg | DELAYED_RELEASE_TABLET | Freq: Every day | ORAL | Status: DC
  Administered 2013-09-01: 40 mg via ORAL
  Filled 2013-08-31 (×2): qty 1

## 2013-08-31 MED ORDER — GENTAMICIN SULFATE 40 MG/ML IJ SOLN
400.0000 mg | Freq: Once | INTRAVENOUS | Status: DC
  Filled 2013-08-31: qty 10

## 2013-08-31 MED ORDER — BELLADONNA ALKALOIDS-OPIUM 16.2-60 MG RE SUPP
RECTAL | Status: DC | PRN
  Administered 2013-08-31: 1 via RECTAL

## 2013-08-31 MED ORDER — LOSARTAN POTASSIUM 50 MG PO TABS
100.0000 mg | ORAL_TABLET | Freq: Every morning | ORAL | Status: DC
  Filled 2013-08-31: qty 2

## 2013-08-31 MED ORDER — DEXAMETHASONE SODIUM PHOSPHATE 4 MG/ML IJ SOLN
INTRAMUSCULAR | Status: DC | PRN
  Administered 2013-08-31: 10 mg via INTRAVENOUS

## 2013-08-31 MED ORDER — ONDANSETRON HCL 4 MG/2ML IJ SOLN
4.0000 mg | INTRAMUSCULAR | Status: DC | PRN
  Filled 2013-08-31: qty 2

## 2013-08-31 MED ORDER — ONDANSETRON HCL 4 MG PO TABS
4.0000 mg | ORAL_TABLET | Freq: Four times a day (QID) | ORAL | Status: DC
  Administered 2013-09-01: 4 mg via ORAL
  Filled 2013-08-31 (×5): qty 1

## 2013-08-31 SURGICAL SUPPLY — 21 items
BAG DRAIN URO-CYSTO SKYTR STRL (DRAIN) ×4 IMPLANT
BAG URINE DRAINAGE (UROLOGICAL SUPPLIES) ×4 IMPLANT
BAG URINE LEG 19OZ MD ST LTX (BAG) IMPLANT
CANISTER SUCT LVC 12 LTR MEDI- (MISCELLANEOUS) ×16 IMPLANT
CATH FOLEY 3WAY 30CC 22F (CATHETERS) ×4 IMPLANT
CATH INTERMIT  6FR 70CM (CATHETERS) ×4 IMPLANT
CLOTH BEACON ORANGE TIMEOUT ST (SAFETY) ×4 IMPLANT
DRAPE CAMERA CLOSED 9X96 (DRAPES) ×4 IMPLANT
ELECT LOOP MED HF 24F 12D CBL (CLIP) ×4 IMPLANT
EVACUATOR MICROVAS BLADDER (UROLOGICAL SUPPLIES) ×4 IMPLANT
GLOVE BIO SURGEON STRL SZ7.5 (GLOVE) ×4 IMPLANT
GLOVE BIOGEL PI IND STRL 7.5 (GLOVE) ×4 IMPLANT
GLOVE BIOGEL PI INDICATOR 7.5 (GLOVE) ×4
GOWN STRL REUS W/TWL XL LVL3 (GOWN DISPOSABLE) ×8 IMPLANT
GUIDEWIRE STR DUAL SENSOR (WIRE) ×4 IMPLANT
HOLDER FOLEY CATH W/STRAP (MISCELLANEOUS) ×4 IMPLANT
IV NS IRRIG 3000ML ARTHROMATIC (IV SOLUTION) ×56 IMPLANT
NS IRRIG 500ML POUR BTL (IV SOLUTION) ×12 IMPLANT
PACK CYSTOSCOPY (CUSTOM PROCEDURE TRAY) ×4 IMPLANT
SET ASPIRATION TUBING (TUBING) ×4 IMPLANT
SYR 30ML LL (SYRINGE) ×4 IMPLANT

## 2013-08-31 NOTE — Anesthesia Preprocedure Evaluation (Addendum)
Anesthesia Evaluation  Patient identified by MRN, date of birth, ID band Patient awake    Reviewed: Allergy & Precautions, H&P , NPO status , Patient's Chart, lab work & pertinent test results, reviewed documented beta blocker date and time   Airway Mallampati: I TM Distance: >3 FB Neck ROM: Full    Dental  (+) Edentulous Upper, Dental Advisory Given, Lower Dentures   Pulmonary neg pulmonary ROS, former smoker,  breath sounds clear to auscultation        Cardiovascular Exercise Tolerance: Good hypertension, Pt. on medications + CAD Rhythm:Regular Rate:Normal     Neuro/Psych PSYCHIATRIC DISORDERS Anxiety Depression CRPS    GI/Hepatic Neg liver ROS, hiatal hernia, GERD-  Medicated,  Endo/Other  negative endocrine ROS  Renal/GU negative Renal ROS     Musculoskeletal negative musculoskeletal ROS (+)   Abdominal (+)  Abdomen: soft. Bowel sounds: normal.  Peds  Hematology negative hematology ROS (+)   Anesthesia Other Findings MI in 1997, stents x 3, pt. On baby aspirin  Reproductive/Obstetrics                         Anesthesia Physical  Anesthesia Plan  ASA: III  Anesthesia Plan: General   Post-op Pain Management:    Induction: Intravenous  Airway Management Planned: Oral ETT and LMA  Additional Equipment:   Intra-op Plan:   Post-operative Plan: Extubation in OR  Informed Consent: I have reviewed the patients History and Physical, chart, labs and discussed the procedure including the risks, benefits and alternatives for the proposed anesthesia with the patient or authorized representative who has indicated his/her understanding and acceptance.   Dental advisory given  Plan Discussed with: CRNA  Anesthesia Plan Comments:         Anesthesia Quick Evaluation

## 2013-08-31 NOTE — Discharge Instructions (Signed)
Transurethral Resection of the Prostate ° °Care After ° °Refer to this sheet in the next few weeks. These discharge instructions provide you with general information on caring for yourself after you leave the hospital. Your caregiver may also give you specific instructions. Your treatment has been planned according to the most current medical practices available, but unavoidable complications sometimes occur. If you have any problems or questions after discharge, please call your caregiver. ° °HOME CARE INSTRUCTIONS  ° °Medications °· You may receive medicine for pain management. As your level of discomfort decreases, adjustments in your pain medicines may be made.  °· Take all medicines as directed.  °· You may be given a medicine (antibiotic) to kill germs following surgery. Finish all medicines. Let your caregiver know if you have any side effects or problems from the medicine.  °· If you are on aspirin, it would be best not to restart the aspirin until the blood in the urine clears °Hygiene °· You can take a shower after surgery.  °· You should not take a bath while you still have the urethral catheter. °Activity °· You will be encouraged to get out of bed as much as possible and increase your activity level as tolerated.  °· Spend the first week in and around your home. For 3 weeks, avoid the following:  °· Straining.  °· Running.  °· Strenuous work.  °· Walks longer than a few blocks.  °· Riding for extended periods.  °· Sexual relations.  °· Do not lift heavy objects (more than 20 pounds) for at least 1 month. When lifting, use your arms instead of your abdominal muscles.  °· You will be encouraged to walk as tolerated. Do not exert yourself. Increase your activity level slowly. Remember that it is important to keep moving after an operation of any type. This cuts down on the possibility of developing blood clots.  °· Your caregiver will tell you when you can resume driving and light housework. Discuss this  at your first office visit after discharge. °Diet °· No special diet is ordered after a TURP. However, if you are on a special diet for another medical problem, it should be continued.  °· Normal fluid intake is usually recommended.  °· Avoid alcohol and caffeinated drinks for 2 weeks. They irritate the bladder. Decaffeinated drinks are okay.  °· Avoid spicy foods.  °Bladder Function °· For the first 10 days, empty the bladder whenever you feel a definite desire. Do not try to hold the urine for long periods of time.  °· Urinating once or twice a night even after you are healed is not uncommon.  °· You may see some recurrence of blood in the urine after discharge from the hospital. This usually happens within 2 weeks after the procedure.If this occurs, force fluids again as you did in the hospital and reduce your activity.  °Bowel Function °· You may experience some constipation after surgery. This can be minimized by increasing fluids and fiber in your diet. Drink enough water and fluids to keep your urine clear or pale yellow.  °· A stool softener may be prescribed for use at home. Do not strain to move your bowels.  °· If you are requiring increased pain medicine, it is important that you take stool softeners to prevent constipation. This will help to promote proper healing by reducing the need to strain to move your bowels.  °Sexual Activity °· Semen movement in the opposite direction and into the bladder (  retrograde ejaculation) may occur. Since the semen passes into the bladder, cloudy urine can occur the first time you urinate after intercourse. Or, you may not have an ejaculation during erection. Ask your caregiver when you can resume sexual activity. Retrograde ejaculation and reduced semen discharge should not reduce one's pleasure of intercourse.  °Postoperative Visit °· Arrange the date and time of your after surgery visit with your caregiver.  °Return to Work °· After your recovery is complete, you will  be able to return to work and resume all activities. Your caregiver will inform you when you can return to work.  °Foley Catheter Care °A soft, flexible tube (Foley catheter) may have been placed in your bladder to drain urine and fluid. Follow these instructions: °Taking Care of the Catheter °· Keep the area where the catheter leaves your body clean.  °· Attach the catheter to the leg so there is no tension on the catheter.  °· Keep the drainage bag below the level of the bladder, but keep it OFF the floor.  °· Do not take long soaking baths. Your caregiver will give instructions about showering.  °· Wash your hands before touching ANYTHING related to the catheter or bag.  °· Using mild soap and warm water on a washcloth:  °· Clean the area closest to the catheter insertion site using a circular motion around the catheter.  °· Clean the catheter itself by wiping AWAY from the insertion site for several inches down the tube.  °· NEVER wipe upward as this could sweep bacteria up into the urethra (tube in your body that normally drains the bladder) and cause infection.  °· Place a small amount of sterile lubricant at the tip of the penis where the catheter is entering.  °Taking Care of the Drainage Bags °· Two drainage bags may be taken home: a large overnight drainage bag, and a smaller leg bag which fits underneath clothing.  °· It is okay to wear the overnight bag at any time, but NEVER wear the smaller leg bag at night.  °· Keep the drainage bag well below the level of your bladder. This prevents backflow of urine into the bladder and allows the urine to drain freely.  °· Anchor the tubing to your leg to prevent pulling or tension on the catheter. Use tape or a leg strap provided by the hospital.  °· Empty the drainage bag when it is 1/2 to 3/4 full. Wash your hands before and after touching the bag.  °· Periodically check the tubing for kinks to make sure there is no pressure on the tubing which could restrict  the flow of urine.  °Changing the Drainage Bags °· Cleanse both ends of the clean bag with alcohol before changing.  °· Pinch off the rubber catheter to avoid urine spillage during the disconnection.  °· Disconnect the dirty bag and connect the clean one.  °· Empty the dirty bag carefully to avoid a urine spill.  °· Attach the new bag to the leg with tape or a leg strap.  °Cleaning the Drainage Bags °· Whenever a drainage bag is disconnected, it must be cleaned quickly so it is ready for the next use.  °· Wash the bag in warm, soapy water.  °· Rinse the bag thoroughly with warm water.  °· Soak the bag for 30 minutes in a solution of white vinegar and water (1 cup vinegar to 1 quart warm water).  °· Rinse with warm water.  °SEEK MEDICAL   CARE IF:  °· You have chills or night sweats.  °· You are leaking around your catheter or have problems with your catheter. It is not uncommon to have sporadic leakage around your catheter as a result of bladder spasms. If the leakage stops, there is not much need for concern. If you are uncertain, call your caregiver.  °· You develop side effects that you think are coming from your medicines.  °SEEK IMMEDIATE MEDICAL CARE IF:  °· You are suddenly unable to urinate. Check to see if there are any kinks in the drainage tubing that may cause this. If you cannot find any kinks, call your caregiver immediately. This is an emergency.  °· You develop shortness of breath or chest pains.  °· Bleeding persists or clots develop in your urine.  °· You have a fever.  °· You develop pain in your back or over your lower belly (abdomen).  °· You develop pain or swelling in your legs.  °· Any problems you are having get worse rather than better.  °MAKE SURE YOU:  °· Understand these instructions.  °· Will watch your condition.  °· Will get help right away if you are not doing well or get worse.  °Document Released: 02/02/2005 Document Revised: 10/15/2010 Document Reviewed: 09/26/2008 °[employer]®  Patient Information ©2012 [employer].Transurethral Resection of the Prostate °Care After °Refer to this sheet in the next few weeks. These discharge instructions provide you with general information on caring for yourself after you leave the hospital. Your caregiver may also give you specific instructions. Your treatment has been planned according to the most current medical practices available, but unavoidable complications sometimes occur. If you have any problems or questions after discharge, please call your caregiver. °

## 2013-08-31 NOTE — Anesthesia Procedure Notes (Signed)
Procedure Name: LMA Insertion Date/Time: 08/31/2013 9:34 AM Performed by: Wanita Chamberlain Pre-anesthesia Checklist: Patient identified, Emergency Drugs available, Suction available, Patient being monitored and Timeout performed Patient Re-evaluated:Patient Re-evaluated prior to inductionOxygen Delivery Method: Circle system utilized Intubation Type: IV induction Ventilation: Mask ventilation without difficulty LMA: LMA inserted LMA Size: 4.0 Number of attempts: 1 Placement Confirmation: positive ETCO2 Tube secured with: Tape Dental Injury: Teeth and Oropharynx as per pre-operative assessment

## 2013-08-31 NOTE — Transfer of Care (Signed)
Immediate Anesthesia Transfer of Care Note  Patient: Larry Randall  Procedure(s) Performed: Procedure(s): TRANSURETHRAL RESECTION OF BLADDER TUMOR WITH GYRUS (N/A) CYSTOSCOPY WITH bilateral RETROGRADE PYELOGRAM (N/A) TRANSURETHRAL RESECTION OF THE PROSTATE WITH GYRUS  (N/A)  Patient Location: PACU  Anesthesia Type:General  Level of Consciousness: awake, alert , oriented and patient cooperative  Airway & Oxygen Therapy: Patient Spontanous Breathing and Patient connected to nasal cannula oxygen  Post-op Assessment: Report given to PACU RN and Post -op Vital signs reviewed and stable  Post vital signs: Reviewed and stable  Complications: No apparent anesthesia complications

## 2013-08-31 NOTE — OR Nursing (Signed)
Patient family updated.

## 2013-08-31 NOTE — Op Note (Signed)
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 Payge Eppes, M.D.   Anesthesia: Gen.   Complications: None  Specimen(s): 1. Left bladder neck lesion, to pathology  2. Prostate chips, to pathology  Drain(s): 5 French, three-way Foley catheter to CBI  Indications: 63 year old male previously known to me from BPH with voiding symptoms. He underwent a limited TURP of the right lobe of the prostate in the past couple of years. He has a huge prostate, and due to time limit, I was only able to resect the right side of his prostate. This provided adequate improvement of the patient's urinary symptoms. He has been followed routinely. He recently presented with gross hematuria and dysuria, and was seen in the office in my absence by Dr. Phebe Colla. He was found to have a left-sided bladder neck lesion, as well as known left prostatic hypertrophy. He presents at this time for cystoscopy, TURBT of the bladder neck lesion (the patient has a history of smoking as well as chemical dye exposure). Risks and complications of the procedure have been discussed with the patient who understands these and desires to proceed.    Technique and findings: The patient was properly identified in the holding area. He received preoperative IV antibiotics. He was taken to the operating room where general anesthetic was administered with the LMA. He was placed in the dorsolithotomy position. Genitalia and perineum were prepped and draped. Proper timeout was performed.  I passed a cystoscope through the urethra. The prostatic urethra was obstructed, with the left lobe overlying the prostatic fossa. There was one web of scar tissue connecting the left prostatic lobe to the resected right prostatic fossa. This was easily traversed with the scope. The bladder was entered and inspected  circumferentially. There were moderate trabeculations. No discrete bladder lesions were seen except for 2 separate lesions in the left prostatic/bladder neck area. The largest of with his was approximately 15 mm in size. They were on the prostatic lobe. He did have a mildly papillary configuration.  No other bladder lesions were seen.  Bilateral retrograde ureteropyelograms were performed. The left ureteral orifice was somewhat difficult to discern. A 6 French open-ended catheter was used for the retrograde. The right retrograde pyelogram revealed J. hooking of the distal ureter, but no filling defects. Pyelocalyceal system was normal.  Again, the left ureteral orifice was cannulated with the help of the sensor-tip guidewire. Retrograde also revealed J. hooking of the distal ureter. There were no filling defects, no scarring, and the ureter was normal except for the J. hooking. Pyelocalyceal system on the left was also normal.  At this point, the resectoscope was passed with the visual obturator. The resectoscope element and cutting loop were attached. I resected the bladder neck lesions down to muscular fibers. These were sent as "bladder neck lesions". At this point, but TURP was performed. I started the resection anteriorly, and resected the left prostatic lobe basically down to the surgical capsule. The prostatic urethra was quite long, approximate 6-7 cm. Resection took place for about 90 minutes. There was a limited resection of the mild regrowth of the tissue in the right prostatic fossa. Apical tissue and anterior tissue was trimmed. At this point, I spent a significant amount of time achieving hemostasis with the coagulation current. Adequate hemostasis was noted. All chips were evacuated from the bladder and sent for pathology. These  were labeled prostate chips. Following this, a 54 French Foley catheter, three-way was passed. The balloon was filled with 45 cc of water. This was hooked to CBI with  saline. Mild traction was placed on the catheter.  At this point, the patient was awakened and taken to the PACU in stable condition. He tolerated the procedure well.

## 2013-09-01 ENCOUNTER — Encounter (HOSPITAL_BASED_OUTPATIENT_CLINIC_OR_DEPARTMENT_OTHER): Payer: Self-pay | Admitting: Urology

## 2013-09-01 DIAGNOSIS — N3289 Other specified disorders of bladder: Secondary | ICD-10-CM | POA: Diagnosis not present

## 2013-09-01 NOTE — Progress Notes (Signed)
Patient discharged home with daughter, discharge instructions given and explained to patient and he verbalized understanding, denies any pain/distress. Accompanied home by daughter. No wound noted, skin intact. Transported to the car by staff via wheelchair.

## 2013-09-01 NOTE — Anesthesia Postprocedure Evaluation (Signed)
Anesthesia Post Note  Patient: Larry Randall  Procedure(s) Performed: Procedure(s) (LRB): TRANSURETHRAL RESECTION OF BLADDER TUMOR WITH GYRUS (N/A) CYSTOSCOPY WITH bilateral RETROGRADE PYELOGRAM (N/A) TRANSURETHRAL RESECTION OF THE PROSTATE WITH GYRUS  (N/A)  Anesthesia type: General  Patient location: PACU  Post pain: Pain level controlled  Post assessment: Post-op Vital signs reviewed  Last Vitals: BP 113/66  Pulse 76  Temp(Src) 36.7 C (Oral)  Resp 18  Ht 5\' 11"  (1.803 m)  Wt 201 lb (91.173 kg)  BMI 28.05 kg/m2  SpO2 96%  Post vital signs: Reviewed  Level of consciousness: sedated  Complications: No apparent anesthesia complications

## 2013-09-01 NOTE — Progress Notes (Signed)
1 Day Post-Op Subjective: Patient reports no significant pain. He has already voided once this morning.   Objective: Vital signs in last 24 hours: Temp:  [96.9 F (36.1 C)-98.1 F (36.7 C)] 98 F (36.7 C) (07/17 0537) Pulse Rate:  [62-83] 71 (07/17 0537) Resp:  [11-18] 18 (07/17 0537) BP: (90-161)/(50-92) 100/51 mmHg (07/17 0537) SpO2:  [96 %-100 %] 96 % (07/17 0537) Weight:  [91.173 kg (201 lb)] 91.173 kg (201 lb) (07/16 0756)  Intake/Output from previous day: 07/16 0701 - 07/17 0700 In: 28062.5 [P.O.:1140; I.V.:2822.5] Out: 08144 [Urine:27550] Intake/Output this shift:    Physical Exam:  Constitutional: Vital signs reviewed. WD WN in NAD   Eyes: PERRL, No scleral icterus.   Pulmonary/Chest: Normal effort present.  Genitourinary: Extremities: No cyanosis or edema  Urine look bloody, but there no clots Lab Results:  Recent Labs  08/31/13 0822  HGB 15.0  HCT 44.0   BMET  Recent Labs  08/31/13 0822  NA 143  K 3.4*  CL 107  GLUCOSE 106*  BUN 13  CREATININE 0.80   No results found for this basename: LABPT, INR,  in the last 72 hours No results found for this basename: LABURIN,  in the last 72 hours Results for orders placed during the hospital encounter of 08/14/13  URINE CULTURE     Status: None   Collection Time    08/14/13  9:27 AM      Result Value Ref Range Status   Specimen Description URINE, CLEAN CATCH   Final   Special Requests NONE   Final   Culture  Setup Time     Final   Value: 08/14/2013 13:46     Performed at [place]     Final   Value: NO GROWTH     Performed at [facility]   Culture     Final   Value: NO GROWTH     Performed at [facility]   Report Status 08/15/2013 FINAL   Final    Studies/Results: Dg C-arm 1-60 Min-no Report  08/31/2013   CLINICAL DATA: retrogrades in room 1 [facility]   C-ARM 1-60 MINUTES  Fluoroscopy was utilized by the requesting physician.  No radiographic   interpretation.     Assessment/Plan:   Postoperative day 1 from TURP bladder neck lesion as well as TURP. He voided already. I'll see how he does this morning. If he has adequate voids later, we'll consider discharge.   LOS: 1 day   Franchot Gallo M 09/01/2013, 7:28 AM

## 2013-09-01 NOTE — Progress Notes (Signed)
Utilization review completed.  

## 2013-09-04 NOTE — Discharge Summary (Signed)
Patient ID: BASTION BOLGER MRN: [id number] DOB/AGE: 26-Mar-1950 63 y.o.  Admit date: 08/31/2013 Discharge date: 09/04/2013  Primary Care Physician:  Tinnie Gens  Discharge Diagnoses:   Present on Admission:  . Hypertrophy of prostate with urinary obstruction and other lower urinary tract symptoms (LUTS)  Consults:  None   Discharge Medications:   Medication List         bifidobacterium infantis capsule  Take 1 capsule by mouth daily.     citalopram 40 MG tablet  Commonly known as:  CELEXA  Take 40 mg by mouth daily.     DSS 100 MG Caps  Take 100 mg by mouth 2 (two) times daily.     fluticasone 50 MCG/ACT nasal spray  Commonly known as:  FLONASE  Place 1 spray into both nostrils every morning.     gabapentin 600 MG tablet  Commonly known as:  NEURONTIN  Take 600 mg by mouth 4 (four) times daily.     levofloxacin 500 MG tablet  Commonly known as:  LEVAQUIN  Take 1 tablet (500 mg total) by mouth daily.     losartan 100 MG tablet  Commonly known as:  COZAAR  Take 100 mg by mouth every morning.     omeprazole 20 MG capsule  Commonly known as:  PRILOSEC  Take 20 mg by mouth every evening.     ondansetron 4 MG tablet  Commonly known as:  ZOFRAN  Take 1 tablet (4 mg total) by mouth every 6 (six) hours.     PRESCRIPTION MEDICATION  Inject as directed once a week. Allergy injection every Tuesday.     rosuvastatin 10 MG tablet  Commonly known as:  CRESTOR  Take 10 mg by mouth every evening.     thyroid 60 MG tablet  Commonly known as:  ARMOUR  Take 60 mg by mouth daily before breakfast.         Significant Diagnostic Studies:  Dg C-arm 1-60 Min-no Report  08/31/2013   CLINICAL DATA: retrogrades in room 1 [facility]   C-ARM 1-60 MINUTES  Fluoroscopy was utilized by the requesting physician.  No radiographic  interpretation.     Brief H and P: For complete details please refer to admission H and P, but in brief the patient was admitted following TUR  BT/TURP.  Hospital Course:  The patient was admitted following his procedure. At first, his urine was quite bloody and the irrigation needed to be run at a brisk rate. However, this eventually slowed down and his catheter was removed postoperative morning #1. He voided several times, with clearing urine and without clots. He was discharged on postoperative day #1.  Day of Discharge BP 113/66  Pulse 76  Temp(Src) 98 F (36.7 C) (Oral)  Resp 18  Ht 5\' 11"  (1.803 m)  Wt 91.173 kg (201 lb)  BMI 28.05 kg/m2  SpO2 96%  No results found for this or any previous visit (from the past 24 hour(s)).  Physical Exam: General: Alert and awake oriented x3 not in any acute distress. HEENT: anicteric sclera, pupils reactive to light and accommodation CVS: S1-S2 clear no murmur rubs or gallops Chest: clear to auscultation bilaterally, no wheezing rales or rhonchi Abdomen: soft nontender, nondistended, normal bowel sounds, no organomegaly Extremities: no cyanosis, clubbing or edema noted bilaterally Neuro: Cranial nerves II-XII intact, no focal neurological deficits  Disposition:  Home  Diet:  No restrictions  Activity:  Discussed with patient   Disposition and Follow-up:  Discharge Instructions   Discharge patient    Complete by:  As directed             He was discharged to home  TESTS THAT NEED FOLLOW-UP  Review of pathology  DISCHARGE FOLLOW-UP Follow-up Information   Follow up with Jorja Loa, MD. (As scheduled)    Specialty:  Urology   Contact information:   [place] [address] [phone]       Time spent on Discharge:  10 minutes  Signed: Jorja Loa 09/04/2013, 8:17 AM

## 2013-09-05 ENCOUNTER — Ambulatory Visit: Payer: Self-pay | Admitting: Physical Medicine & Rehabilitation

## 2013-10-09 ENCOUNTER — Ambulatory Visit: Payer: Self-pay | Admitting: Physical Medicine & Rehabilitation

## 2013-11-06 ENCOUNTER — Encounter: Payer: Worker's Compensation | Attending: Physical Medicine & Rehabilitation

## 2013-11-06 ENCOUNTER — Ambulatory Visit (HOSPITAL_BASED_OUTPATIENT_CLINIC_OR_DEPARTMENT_OTHER): Payer: Worker's Compensation | Admitting: Physical Medicine & Rehabilitation

## 2013-11-06 ENCOUNTER — Encounter: Payer: Self-pay | Admitting: Physical Medicine & Rehabilitation

## 2013-11-06 VITALS — BP 140/76 | HR 89 | Resp 14 | Wt 213.0 lb

## 2013-11-06 DIAGNOSIS — M12579 Traumatic arthropathy, unspecified ankle and foot: Secondary | ICD-10-CM

## 2013-11-06 DIAGNOSIS — M19172 Post-traumatic osteoarthritis, left ankle and foot: Secondary | ICD-10-CM

## 2013-11-06 DIAGNOSIS — G90529 Complex regional pain syndrome I of unspecified lower limb: Secondary | ICD-10-CM | POA: Diagnosis not present

## 2013-11-06 DIAGNOSIS — G8921 Chronic pain due to trauma: Secondary | ICD-10-CM

## 2013-11-06 DIAGNOSIS — G8929 Other chronic pain: Secondary | ICD-10-CM | POA: Diagnosis not present

## 2013-11-06 DIAGNOSIS — G90521 Complex regional pain syndrome I of right lower limb: Secondary | ICD-10-CM

## 2013-11-06 MED ORDER — GABAPENTIN 600 MG PO TABS: 600.0000 mg | ORAL_TABLET | Freq: Four times a day (QID) | ORAL | Status: DC

## 2013-11-06 NOTE — Progress Notes (Signed)
Subjective:    Patient ID: Larry Randall, male    DOB: April 26, 1950, 63 y.o.   MRN: [id number]  History of left tibial pilon fracture 2013 status post ORIF History of right calcaneal fracture 2013 status post ORIF  Work related injury: September 22 or 23rd 2013  Permanent restrictions per Dr Doran Durand, with PPD. Has seen Dr. Mayer Camel for rating, no change in rating. 50% in each ankle.  Patient reports increased swelling in the left ankle as well as increased pain. Has had Doppler venous ultrasound to evaluate which was negative. Patient increased pain with standing  Pain is mainly on lateral aspect of the ankle.  Patient also notes increasing difficulty with squatting, needs assist with getting back up again  HPI Seen by Dr Doran Durand ~ 7wks ago, Left foot injection,  had another injection Lateral left heel 1 wk ago which was helpful  Current pain is anterior left ankle at joint line.  Increased pain over the last week, soft primary care physician who ordered ankle x-rays. No trauma to the ankles noted.  Since last visit patient has had prostate surgery for BPH. missed couple visits because of this.  Continues to work full time but is not doing as much physical work mostly supervised. Patient has a goal of working for 1 more year Pain Inventory Average Pain 6 Pain Right Now 6 My pain is sharp, burning and stabbing  In the last 24 hours, has pain interfered with the following? General activity 7 Relation with others 5 Enjoyment of life 8 What TIME of day is your pain at its worst? evening Sleep (in general) Poor  Pain is worse with: walking Pain improves with: rest Relief from Meds: 0  Mobility walk without assistance how many minutes can you walk? 30 ability to climb steps?  yes do you drive?  yes  Function employed # of hrs/week 48  Neuro/Psych weakness numbness trouble walking depression  Prior Studies Any changes since last visit?  no  Physicians involved in  your care Any changes since last visit?  no   Family History  Problem Relation Age of Onset  . Heart disease Father   . Colon cancer Neg Hx   . Esophageal cancer Neg Hx   . Rectal cancer Neg Hx   . Stomach cancer Neg Hx    History   Social History  . Marital Status: Divorced    Spouse Name: N/A    Number of Children: 3  . Years of Education: N/A   Social History Main Topics  . Smoking status: Former Smoker -- 1.00 packs/day for 30 years    Types: Cigarettes    Quit date: 09/02/1994  . Smokeless tobacco: Never Used  . Alcohol Use: No  . Drug Use: No  . Sexual Activity: None   Other Topics Concern  . None   Social History Narrative  . None   Past Surgical History  Procedure Laterality Date  . Tonsillectomy and adenoidectomy  CHILD  . Extracorporeal shock wave lithotripsy  05-02-2009  . Transurethral resection of prostate  X2   YRS AGO    BEFORE 1996  . Coronary angioplasty with stent placement  1996    PER PT X3 STENTS (BARE METAL)  . Transurethral resection of prostate  09/07/2011    Procedure: TRANSURETHRAL RESECTION OF THE PROSTATE WITH GYRUS INSTRUMENTS;  Surgeon: Franchot Gallo, MD;  Location: [facility];  Service: Urology;  Laterality: N/A;  . External fixation leg  11/09/2011  Procedure: EXTERNAL FIXATION LEG;  Surgeon: Wylene Simmer, MD;  Location: [place];  Service: Orthopedics;  Laterality: Left;  . Orif calcaneous fracture  11/12/2011    Procedure: OPEN REDUCTION INTERNAL FIXATION (ORIF) CALCANEOUS FRACTURE;  Surgeon: Wylene Simmer, MD;  Location: [place];  Service: Orthopedics;  Laterality: Right;  ORIF RIGHT CALCANEOUS FRACTURE  . Orif tibia fracture  11/17/2011    Procedure: OPEN REDUCTION INTERNAL FIXATION (ORIF) TIBIA FRACTURE;  Surgeon: Wylene Simmer, MD;  Location: [place];  Service: Orthopedics;  Laterality: Left;  REMOVAL OF EXFIX, ORIF LEFT TIBIAL PILON FRACTURE  . Colonoscopy with esophagogastroduodenoscopy (egd)  10-29-2011  .  Transurethral resection of bladder tumor with gyrus (turbt-gyrus) N/A 08/31/2013    Procedure: TRANSURETHRAL RESECTION OF BLADDER TUMOR WITH GYRUS;  Surgeon: Jorja Loa, MD;  Location[facility];  Service: Urology;  Laterality: N/A;  . Cystoscopy w/ retrogrades N/A 08/31/2013    Procedure: CYSTOSCOPY WITH bilateral RETROGRADE PYELOGRAM;  Surgeon: Jorja Loa, MD;  Location: [facility];  Service: Urology;  Laterality: N/A;  . Transurethral resection of prostate N/A 08/31/2013    Procedure: TRANSURETHRAL RESECTION OF THE PROSTATE WITH GYRUS ;  Surgeon: Jorja Loa, MD;  Location: [facility];  Service: Urology;  Laterality: N/A;   Past Medical History  Diagnosis Date  . Anxiety   . GERD (gastroesophageal reflux disease)   . Hyperlipemia   . History of MI (myocardial infarction) 1996--  S/P ANGIOPLASTY X3 STENTS  . S/P primary angioplasty with coronary stent X3 STENTS  1996  POST MI    PER PT BARE METAL STENTS  . H/O hiatal hernia   . History of CHF (congestive heart failure)   . History of gastric ulcer   . Atypical rash OF MOUTH -- PER PT UNKNOWN ETIOLOGY    RX MOUTHWASH BID-  PT STATES POSSIBLE FROM GERD  . History of kidney stones   . Depression   . History of colon polyps     2013  . BPH (benign prostatic hypertrophy)   . History of urinary retention   . 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 Sigmoid diverticulosis   . Bladder mass     BLADDER NECK AREA  . Arthritis     LEFT LEG AND ANKLE  . At risk for sleep apnea     STOP-BANG=  6   SENT TO PCP 08-28-2013  . Hypertension   . CAD (coronary artery disease) FOLLOWED PCP  DR Dickey Gave  [facility])    PT DENIES CARDIAC SYMPTOMS   BP 140/76  Pulse 89  Resp 14  Wt 213 lb (96.616 kg)  SpO2 97%  Opioid Risk Score:   Fall Risk Score: Moderate Fall Risk (6-13 points) (previoulsy educated and given  handout) Review of Systems  Constitutional: Positive for unexpected weight change.  Musculoskeletal: Positive for gait problem.  Neurological: Positive for weakness and numbness.  Psychiatric/Behavioral: Positive for dysphoric mood.  All other systems reviewed and are negative.      Objective:   Physical Exam No evidence of erythema Right foot no evidence of joint swelling in the right foot or ankle area Good ankle plantar flexion dorsiflexion range of motion. eversion is painful on the medial aspect.  Left ankle has no evidence of erythema. No evidence of joint swelling. Distal leg with mild edema. Pain at anterior joint line  Reduced sensation  plantar surface right foot medial Reduced sensation to light touch great toes bilateral       Assessment & Plan:  1.  RSD mild exacerbation R foot, the patient has not been consistent with his gabapentin. He missed some appointments. Had to buy gabapentin without insurance coverage Do not think he needs further lumbar sympathetic ganglion blocks   2.  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 Has some blood work pending with primary care physician will ask for this to be forwarded to our clinic. If this is not helpful, consider cortisone injection by orthopedics left ankle   Has had some cramping in the calves this may be metabolic. Also last GFR moderately reduced but prior values have been normal. May need to monitor on low dose nonsteroidals  RTC 3 month

## 2013-11-06 NOTE — Patient Instructions (Signed)
May benefit from left ankle injection by Dr. Doran Durand if not better after taking arthritis meds  Naproxen sodium 200mg  0ne or two tablets twice a day Or Ibuprofen 200mg  2 tablets three times a day

## 2014-01-29 ENCOUNTER — Encounter: Payer: Worker's Compensation | Attending: Physical Medicine & Rehabilitation

## 2014-01-29 ENCOUNTER — Encounter: Payer: Self-pay | Admitting: Physical Medicine & Rehabilitation

## 2014-01-29 ENCOUNTER — Ambulatory Visit (HOSPITAL_BASED_OUTPATIENT_CLINIC_OR_DEPARTMENT_OTHER): Payer: Worker's Compensation | Admitting: Physical Medicine & Rehabilitation

## 2014-01-29 VITALS — BP 121/61 | HR 74 | Resp 14 | Wt 217.4 lb

## 2014-01-29 DIAGNOSIS — M12572 Traumatic arthropathy, left ankle and foot: Secondary | ICD-10-CM | POA: Insufficient documentation

## 2014-01-29 DIAGNOSIS — G8921 Chronic pain due to trauma: Secondary | ICD-10-CM | POA: Insufficient documentation

## 2014-01-29 DIAGNOSIS — M19172 Post-traumatic osteoarthritis, left ankle and foot: Secondary | ICD-10-CM

## 2014-01-29 DIAGNOSIS — G90523 Complex regional pain syndrome I of lower limb, bilateral: Secondary | ICD-10-CM | POA: Diagnosis present

## 2014-01-29 DIAGNOSIS — M19179 Post-traumatic osteoarthritis, unspecified ankle and foot: Secondary | ICD-10-CM

## 2014-01-29 HISTORY — DX: Post-traumatic osteoarthritis, unspecified ankle and foot: M19.179

## 2014-01-29 NOTE — Patient Instructions (Signed)
Cont Gabapentin 600mg  4 times a day

## 2014-01-29 NOTE — Progress Notes (Signed)
Subjective:    Patient ID: Larry Randall, male    DOB: 23-Apr-1950, 63 y.o.   MRN: [id number]  HPI History of left tibial pilon fracture 2013 status post ORIF History of right calcaneal fracture 2013 status post ORIF  Work related injury: September 22 or 23rd 2013  Permanent restrictions per Dr Doran Durand, with PPD. Has seen Dr. Mayer Camel for rating, no change in rating. 50% in each ankle.   Patient reports increased swelling in the left ankle as well as increased pain. Has had Doppler venous ultrasound to evaluate which was negative. Patient increased pain with standing   Pain is mainly on lateral aspect of the ankle.   Patient also notes increasing difficulty with squatting, needs assist with getting back up again  HPI Getting relief from ankle injections  Current pain is anterior left ankle at joint line.  Pain Inventory Average Pain 4 Pain Right Now 5 My pain is burning, dull and aching  In the last 24 hours, has pain interfered with the following? General activity 7 Relation with others 7 Enjoyment of life 4 What TIME of day is your pain at its worst? morning daytime and evening Sleep (in general) Fair  Pain is worse with: walking, standing and some activites Pain improves with: rest, heat/ice, medication and injections Relief from Meds: 6  Mobility walk without assistance how many minutes can you walk? 30  Function employed # of hrs/week 48  Neuro/Psych trouble walking depression  Prior Studies Any changes since last visit?  no  Physicians involved in your care Any changes since last visit?  no   Family History  Problem Relation Age of Onset  . Heart disease Father   . Colon cancer Neg Hx   . Esophageal cancer Neg Hx   . Rectal cancer Neg Hx   . Stomach cancer Neg Hx    History   Social History  . Marital Status: Divorced    Spouse Name: N/A    Number of Children: 3  . Years of Education: N/A   Social History Main Topics  . Smoking status:  Former Smoker -- 1.00 packs/day for 30 years    Types: Cigarettes    Quit date: 09/02/1994  . Smokeless tobacco: Never Used  . Alcohol Use: No  . Drug Use: No  . Sexual Activity: None   Other Topics Concern  . None   Social History Narrative   Past Surgical History  Procedure Laterality Date  . Tonsillectomy and adenoidectomy  CHILD  . Extracorporeal shock wave lithotripsy  05-02-2009  . Transurethral resection of prostate  X2   YRS AGO    BEFORE 1996  . Coronary angioplasty with stent placement  1996    PER PT X3 STENTS (BARE METAL)  . Transurethral resection of prostate  09/07/2011    Procedure: TRANSURETHRAL RESECTION OF THE PROSTATE WITH GYRUS INSTRUMENTS;  Surgeon: Franchot Gallo, MD;  Location: [facility];  Service: Urology;  Laterality: N/A;  . External fixation leg  11/09/2011    Procedure: EXTERNAL FIXATION LEG;  Surgeon: Wylene Simmer, MD;  Location: [place];  Service: Orthopedics;  Laterality: Left;  . Orif calcaneous fracture  11/12/2011    Procedure: OPEN REDUCTION INTERNAL FIXATION (ORIF) CALCANEOUS FRACTURE;  Surgeon: Wylene Simmer, MD;  Location: [place];  Service: Orthopedics;  Laterality: Right;  ORIF RIGHT CALCANEOUS FRACTURE  . Orif tibia fracture  11/17/2011    Procedure: OPEN REDUCTION INTERNAL FIXATION (ORIF) TIBIA FRACTURE;  Surgeon: Wylene Simmer, MD;  Location: [place];  Service: Orthopedics;  Laterality: Left;  REMOVAL OF EXFIX, ORIF LEFT TIBIAL PILON FRACTURE  . Colonoscopy with esophagogastroduodenoscopy (egd)  10-29-2011  . Transurethral resection of bladder tumor with gyrus (turbt-gyrus) N/A 08/31/2013    Procedure: TRANSURETHRAL RESECTION OF BLADDER TUMOR WITH GYRUS;  Surgeon: Jorja Loa, MD;  Location: [facility];  Service: Urology;  Laterality: N/A;  . Cystoscopy w/ retrogrades N/A 08/31/2013    Procedure: CYSTOSCOPY WITH bilateral RETROGRADE PYELOGRAM;  Surgeon: Jorja Loa, MD;  Location: [facility];  Service: Urology;  Laterality: N/A;  . Transurethral resection of prostate N/A 08/31/2013    Procedure: TRANSURETHRAL RESECTION OF THE PROSTATE WITH GYRUS ;  Surgeon: Jorja Loa, MD;  Location: [facility];  Service: Urology;  Laterality: N/A;   Past Medical History  Diagnosis Date  . Anxiety   . GERD (gastroesophageal reflux disease)   . Hyperlipemia   . History of MI (myocardial infarction) 1996--  S/P ANGIOPLASTY X3 STENTS  . S/P primary angioplasty with coronary stent X3 STENTS  1996  POST MI    PER PT BARE METAL STENTS  . H/O hiatal hernia   . History of CHF (congestive heart failure)   . History of gastric ulcer   . Atypical rash OF MOUTH -- PER PT UNKNOWN ETIOLOGY    RX MOUTHWASH BID-  PT STATES POSSIBLE FROM GERD  . History of kidney stones   . Depression   . History of colon polyps     2013  . BPH (benign prostatic hypertrophy)   . History of urinary retention   . 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 Sigmoid diverticulosis   . Bladder mass     BLADDER NECK AREA  . Arthritis     LEFT LEG AND ANKLE  . At risk for sleep apnea     STOP-BANG=  6   SENT TO PCP 08-28-2013  . Hypertension   . CAD (coronary artery disease) FOLLOWED PCP  DR Dickey Gave  [facility])    PT DENIES CARDIAC SYMPTOMS   BP 121/61 mmHg  Pulse 74  Resp 14  Wt 217 lb 6.4 oz (98.612 kg)  SpO2 94%  Opioid Risk Score:   Fall Risk Score: Low Fall Risk (0-5 points) (previously educated and given handout)  Review of Systems  Musculoskeletal: Positive for gait problem.  Psychiatric/Behavioral: Positive for dysphoric mood.  All other systems reviewed and are negative.      Objective:   Physical Exam     Physical Exam No evidence of erythema No hypersensitivity to touch No skin changes or nail changes Right foot no evidence of joint swelling in the right foot or ankle area Good ankle plantar flexion  dorsiflexion range of motion. eversion is painful on the medial aspect.  Left ankle has no evidence of erythema. No evidence of joint swelling. Distal leg with mild/mod edema. Pain at anterior joint line  Reduced sensation plantar surface right foot medial Reduced sharp sensation dorsum of right foot       Assessment & Plan:  1.  RSD, Chronic without dystrophic changes now consistent with his gabapentin.  Do not think he needs further lumbar sympathetic ganglion blocks   2.  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

## 2014-04-15 IMAGING — RF DG C-ARM 61-120 MIN
1 series · 3 of 3 positions shown · non-contrast
Comparison: 11/09/2011.

CLINICAL DATA: 60-year-old male undergoing ORIF of the calcaneus.

DG C-ARM 61-120 MIN,RIGHT OS CALCIS - 2+ VIEW
TECHNIQUE: Three intraoperative fluoroscopic views of the right
calcaneus.
Fluoroscopy time of 1.0 minutes was utilized.

[Series 1: run · 3 of 3 slices shown]
[im 1/3]
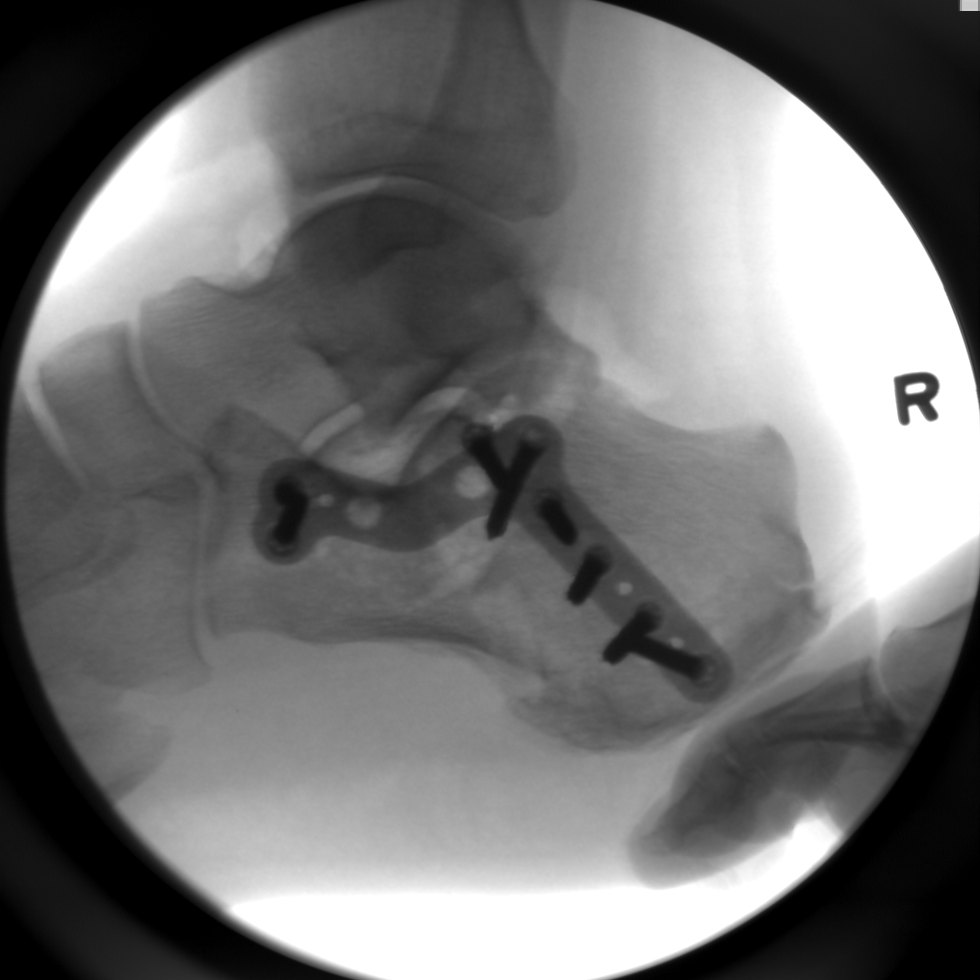
[im 2/3]
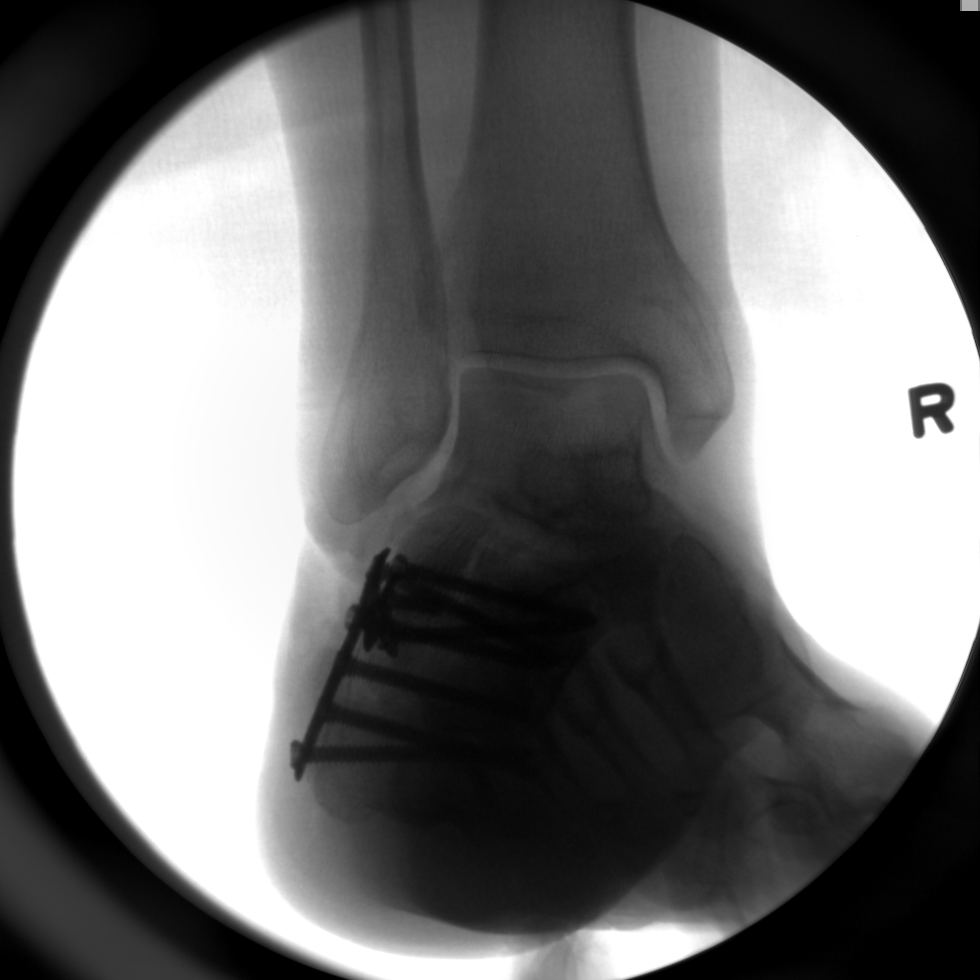
[im 3/3]
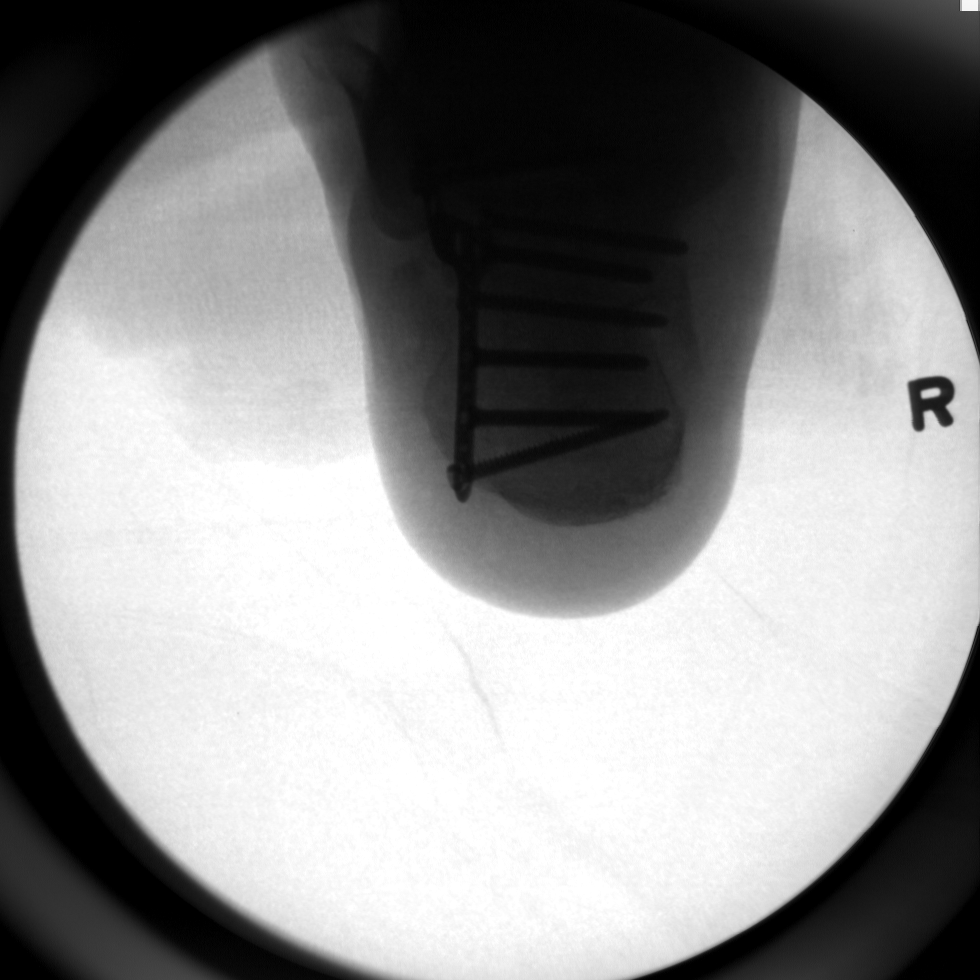

[3 of 3 positions shown; findings below may reference images not displayed]

FINDINGS: Severely comminuted right calcaneus fracture better
demonstrated on comparison.  Lateral plate and multiple cortical
screws placed into the calcaneus.  Hardware appears intact.  Near
anatomic alignment.
IMPRESSION: ORIF right calcaneus with no adverse features.

## 2014-06-18 ENCOUNTER — Other Ambulatory Visit (HOSPITAL_COMMUNITY): Payer: Self-pay | Admitting: Specialist

## 2014-06-18 DIAGNOSIS — R918 Other nonspecific abnormal finding of lung field: Secondary | ICD-10-CM

## 2014-06-25 ENCOUNTER — Ambulatory Visit (HOSPITAL_COMMUNITY)
Admission: RE | Admit: 2014-06-25 | Discharge: 2014-06-25 | Disposition: A | Discharge status: Home / Self Care | Payer: PPO | Source: Ambulatory Visit | Attending: Specialist | Admitting: Specialist

## 2014-06-25 DIAGNOSIS — R918 Other nonspecific abnormal finding of lung field: Secondary | ICD-10-CM | POA: Diagnosis present

## 2014-06-25 LAB — GLUCOSE, CAPILLARY: GLUCOSE-CAPILLARY: 114 mg/dL — AB (ref 70–99)

## 2014-06-25 MED ORDER — FLUDEOXYGLUCOSE F - 18 (FDG) INJECTION
9.4100 | Freq: Once | INTRAVENOUS | Status: AC | PRN
  Administered 2014-06-25: 9.41 via INTRAVENOUS

## 2014-07-30 ENCOUNTER — Ambulatory Visit (HOSPITAL_BASED_OUTPATIENT_CLINIC_OR_DEPARTMENT_OTHER): Payer: Worker's Compensation | Admitting: Physical Medicine & Rehabilitation

## 2014-07-30 ENCOUNTER — Encounter: Payer: Worker's Compensation | Attending: Physical Medicine & Rehabilitation

## 2014-07-30 ENCOUNTER — Encounter: Payer: Self-pay | Admitting: Physical Medicine & Rehabilitation

## 2014-07-30 VITALS — BP 134/86 | HR 80 | Resp 16

## 2014-07-30 DIAGNOSIS — G8921 Chronic pain due to trauma: Secondary | ICD-10-CM | POA: Insufficient documentation

## 2014-07-30 DIAGNOSIS — M12572 Traumatic arthropathy, left ankle and foot: Secondary | ICD-10-CM | POA: Insufficient documentation

## 2014-07-30 DIAGNOSIS — G90523 Complex regional pain syndrome I of lower limb, bilateral: Secondary | ICD-10-CM | POA: Diagnosis not present

## 2014-07-30 DIAGNOSIS — M19172 Post-traumatic osteoarthritis, left ankle and foot: Secondary | ICD-10-CM

## 2014-07-30 MED ORDER — GABAPENTIN 600 MG PO TABS: 600.0000 mg | ORAL_TABLET | Freq: Four times a day (QID) | ORAL | Status: DC
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
## 2014-07-30 NOTE — Patient Instructions (Signed)
Into the walking 3 times per week

## 2014-08-24 ENCOUNTER — Other Ambulatory Visit (HOSPITAL_COMMUNITY): Admission: RE | Admit: 2014-08-24 | Payer: PPO | Source: Ambulatory Visit

## 2014-09-27 ENCOUNTER — Other Ambulatory Visit (HOSPITAL_COMMUNITY): Payer: Self-pay | Admitting: Family

## 2014-09-27 DIAGNOSIS — R918 Other nonspecific abnormal finding of lung field: Secondary | ICD-10-CM

## 2014-10-02 ENCOUNTER — Ambulatory Visit (HOSPITAL_COMMUNITY)
Admission: RE | Admit: 2014-10-02 | Discharge: 2014-10-02 | Disposition: A | Discharge status: Home / Self Care | Payer: PPO | Source: Ambulatory Visit | Attending: Family | Admitting: Family

## 2014-10-02 DIAGNOSIS — R918 Other nonspecific abnormal finding of lung field: Secondary | ICD-10-CM | POA: Diagnosis not present

## 2014-10-02 DIAGNOSIS — I2510 Atherosclerotic heart disease of native coronary artery without angina pectoris: Secondary | ICD-10-CM | POA: Diagnosis not present

## 2014-10-02 DIAGNOSIS — J439 Emphysema, unspecified: Secondary | ICD-10-CM | POA: Diagnosis not present

## 2014-10-02 MED ORDER — IOHEXOL 300 MG/ML  SOLN
100.0000 mL | Freq: Once | INTRAMUSCULAR | Status: AC | PRN
  Administered 2014-10-02: 100 mL via INTRAVENOUS

## 2015-02-28 DIAGNOSIS — M5412 Radiculopathy, cervical region: Secondary | ICD-10-CM | POA: Diagnosis not present

## 2015-03-01 DIAGNOSIS — Z0000 Encounter for general adult medical examination without abnormal findings: Secondary | ICD-10-CM | POA: Diagnosis not present

## 2015-03-01 DIAGNOSIS — K4090 Unilateral inguinal hernia, without obstruction or gangrene, not specified as recurrent: Secondary | ICD-10-CM | POA: Diagnosis not present

## 2015-03-19 ENCOUNTER — Other Ambulatory Visit: Payer: Self-pay | Admitting: Surgery

## 2015-03-19 DIAGNOSIS — K4090 Unilateral inguinal hernia, without obstruction or gangrene, not specified as recurrent: Secondary | ICD-10-CM | POA: Diagnosis not present

## 2015-03-19 DIAGNOSIS — K429 Umbilical hernia without obstruction or gangrene: Secondary | ICD-10-CM | POA: Diagnosis not present

## 2015-03-19 DIAGNOSIS — Z01818 Encounter for other preprocedural examination: Secondary | ICD-10-CM | POA: Diagnosis not present

## 2015-03-19 NOTE — H&P (Addendum)
Cindy Hazy. Panagopoulos 03/19/2015 8:49 AM Location: [facility] Patient #: [id number] DOB: 06-19-1950 Divorced / Language: Cleophus Molt / Race: White Male  Patient Care Team: Imagene Riches, NP as PCP - General Michael Boston, MD as Consulting Physician (General Surgery) Franchot Gallo, MD as Consulting Physician (Urology) Wylene Simmer, MD as Consulting Physician (Orthopedic Surgery)   History of Present Illness [name] MD; 03/19/2015 9:28 AM) The patient is a 65 year old male who presents with an inguinal hernia. Note for "Inguinal hernia": Patient sent for surgical consultation by his urologist, Dr. Murriel Hopper, for concern of umbilical hernia LEFT inguinal hernia.  Active male. History with prostatic hyperplasia status post transurethral prostatic resection times 2, last 2015. No major urinary problems now. Apparently hernias were incidentally noted on the PET scan being worked up for lung mass. As a year or so ago. Has noted some occasional discomfort at his belly button especially his LEFT groin. The his RIGHT groin is become sensitive. Noted on exam by urologist to have a LEFT inguinal hernia and probable umbilical hernia. Surgical consultation requested. Patient is retired. He is usually done a lot of heavy lifting and intense activity in his life. Regularly but has backed off in the wintertime. Trying to get back on the treadmill. Not smoked in several decades. It any abdominal surgery. He did have a myocardial infarction in the distant past. No recent cardiac or stroke issues. He is not on blood thinners. No severe constipation or diarrhea.   Allergies (Sonya Bynum, CMA; 03/19/2015 8:50 AM) No Known Drug Allergies 03/19/2015  Medication History (Sonya Bynum, CMA; 03/19/2015 8:52 AM) Gabapentin (600MG  Tablet, Oral) Active. Citalopram Hydrobromide (40MG  Tablet, Oral) Active. Flonase Allergy Relief (50MCG/ACT Suspension, Nasal) Active. Losartan  Potassium (100MG  Tablet, Oral) Active. Omeprazole (10MG  Capsule DR, Oral) Active. Armour Thyroid (60MG  Tablet, Oral) Active. Medications Reconciled    Vitals (Sonya Bynum CMA; 03/19/2015 8:50 AM) 03/19/2015 8:50 AM Weight: 207 lb Height: 70in Body Surface Area: 2.12 m Body Mass Index: 29.7 kg/m  Temp.: 59F(Temporal)  Pulse: 79 (Regular)  BP: 128/76 (Sitting, Left Arm, Standard)      Physical Exam [name] MD; 03/19/2015 9:29 AM)  General Mental Status-Alert. General Appearance-Not in acute distress, Not Sickly. Orientation-Oriented X3. Hydration-Well hydrated. Voice-Normal.  Integumentary Global Assessment Upon inspection and palpation of skin surfaces of the - Axillae: non-tender, no inflammation or ulceration, no drainage. and Distribution of scalp and body hair is normal. General Characteristics Temperature - normal warmth is noted.  Head and Neck Head-normocephalic, atraumatic with no lesions or palpable masses. Face Global Assessment - atraumatic, no absence of expression. Neck Global Assessment - no abnormal movements, no bruit auscultated on the right, no bruit auscultated on the left, no decreased range of motion, non-tender. Trachea-midline. Thyroid Gland Characteristics - non-tender.  Eye Eyeball - Left-Extraocular movements intact, No Nystagmus. Eyeball - Right-Extraocular movements intact, No Nystagmus. Cornea - Left-No Hazy. Cornea - Right-No Hazy. Sclera/Conjunctiva - Left-No scleral icterus, No Discharge. Sclera/Conjunctiva - Right-No scleral icterus, No Discharge. Pupil - Left-Direct reaction to light normal. Pupil - Right-Direct reaction to light normal.  ENMT Ears Pinna - Left - no drainage observed, no generalized tenderness observed. Right - no drainage observed, no generalized tenderness observed. Nose and Sinuses External Inspection of the Nose - no destructive lesion observed.  Inspection of the nares - Left - quiet respiration. Right - quiet respiration. Mouth and Throat Lips - Upper Lip - no fissures observed, no pallor noted. Lower Lip -  no fissures observed, no pallor noted. Nasopharynx - no discharge present. Oral Cavity/Oropharynx - Tongue - no dryness observed. Oral Mucosa - no cyanosis observed. Hypopharynx - no evidence of airway distress observed.  Chest and Lung Exam Inspection Movements - Normal and Symmetrical. Accessory muscles - No use of accessory muscles in breathing. Palpation Palpation of the chest reveals - Non-tender. Auscultation Breath sounds - Normal and Clear.  Cardiovascular Auscultation Rhythm - Regular. Murmurs & Other Heart Sounds - Auscultation of the heart reveals - No Murmurs and No Systolic Clicks.  Abdomen Inspection Inspection of the abdomen reveals - No Visible peristalsis and No Abnormal pulsations. Umbilicus - No Bleeding, No Urine drainage. Palpation/Percussion Palpation and Percussion of the abdomen reveal - Soft, Non Tender, No Rebound tenderness, No Rigidity (guarding) and No Cutaneous hyperesthesia. Note: Mild suprapubic umbilical diastases recti with 1.5 centimeter umbilical hernia on Valsalva. Reducible. Overweight but soft abdomen. No guarding. No rebound.  Male Genitourinary Note: Normal external male genitalia. Obvious LEFT inguinal hernia to mid groin - very sensitive but reducible. Sensitive RIGHT groin with subtle impulse on RIGHT side suspicious for early RIGHT inguinal hernia. Epididymides, spermatic cords, and testes normal.  Peripheral Vascular Upper Extremity Inspection - Left - No Cyanotic nailbeds, Not Ischemic. Right - No Cyanotic nailbeds, Not Ischemic.  Neurologic Neurologic evaluation reveals -normal attention span and ability to concentrate, able to name objects and repeat phrases. Appropriate fund of knowledge , normal sensation and normal coordination. Mental Status Affect - not angry, not  paranoid. Cranial Nerves-Normal Bilaterally. Gait-Normal.  Neuropsychiatric Mental status exam performed with findings of-able to articulate well with normal speech/language, rate, volume and coherence, thought content normal with ability to perform basic computations and apply abstract reasoning and no evidence of hallucinations, delusions, obsessions or homicidal/suicidal ideation. Note: Sometimes tangential in his speech but pleasant.  Musculoskeletal Global Assessment Spine, Ribs and Pelvis - no instability, subluxation or laxity. Right Upper Extremity - no instability, subluxation or laxity.  Lymphatic Head & Neck  General Head & Neck Lymphatics: Bilateral - Description - No Localized lymphadenopathy. Axillary  General Axillary Region: Bilateral - Description - No Localized lymphadenopathy. Femoral & Inguinal  Generalized Femoral & Inguinal Lymphatics: Left - Description - No Localized lymphadenopathy. Right - Description - No Localized lymphadenopathy.    Assessment & Plan [name] MD; 03/19/2015 9:30 AM)  LEFT INGUINAL HERNIA (K40.90) Impression: Obvious LEFT inguinal hernia and possible RIGHT inguinal hernia as well. Plan laparoscopic preperitoneal repair.  UMBILICAL HERNIA WITHOUT OBSTRUCTION AND WITHOUT GANGRENE (K42.9) Impression: Small but sensitive umbilical hernia in the setting of diastases recti. Plan repair at that time. She'll to use mesh since his intense activity level and diastases and obesity.  ENCOUNTER FOR PRE-OPERATIVE EXAMINATION (Z01.818)  Current Plans You are being scheduled for surgery - Our schedulers will call you.  You should hear from our office's scheduling department within 5 working days about the location, date, and time of surgery. We try to make accommodations for patient's preferences in scheduling surgery, but sometimes the OR schedule or the surgeon's schedule prevents [place] from making those accommodations.  If you have  not heard from our office [phone]) in 5 working days, call the office and ask for your surgeon's nurse.  If you have other questions about your diagnosis, plan, or surgery, call the office and ask for your surgeon's nurse.  Written instructions provided Pt Education - Pamphlet Given - Laparoscopic Hernia Repair: discussed with patient and provided information. The anatomy & physiology of  the abdominal wall and pelvic floor was discussed. The pathophysiology of hernias in the inguinal and pelvic region was discussed. Natural history risks such as progressive enlargement, pain, incarceration, and strangulation was discussed. Contributors to complications such as smoking, obesity, diabetes, prior surgery, etc were discussed.  I feel the risks of no intervention will lead to serious problems that outweigh the operative risks; therefore, I recommended surgery to reduce and repair the hernia. I explained laparoscopic techniques with possible need for an open approach. I noted usual use of mesh to patch and/or buttress hernia repair  Risks such as bleeding, infection, abscess, need for further treatment, heart attack, death, and other risks were discussed. I noted a good likelihood this will help address the problem. Goals of post-operative recovery were discussed as well. Possibility that this will not correct all symptoms was explained. I stressed the importance of low-impact activity, aggressive pain control, avoiding constipation, & not pushing through pain to minimize risk of post-operative chronic pain or injury. Possibility of reherniation was discussed. We will work to minimize complications.  An educational handout further explaining the pathology & treatment options was given as well. Questions were answered. The patient expresses understanding & wishes to proceed with surgery.  Pt Education - [facility] Hernia Post-Op HCI (Marilee Ditommaso): discussed with patient and provided information. Pt Education - [facility]  Pain Control (Khan Chura)  [name], M.D., F.A.C.S. Gastrointestinal and Minimally Invasive Surgery [facility] [address], [place], [place] [address] [phone] Main / Paging

## 2015-03-21 DIAGNOSIS — I10 Essential (primary) hypertension: Secondary | ICD-10-CM | POA: Diagnosis not present

## 2015-03-21 DIAGNOSIS — R05 Cough: Secondary | ICD-10-CM | POA: Diagnosis not present

## 2015-03-21 DIAGNOSIS — J0100 Acute maxillary sinusitis, unspecified: Secondary | ICD-10-CM | POA: Diagnosis not present

## 2015-03-21 DIAGNOSIS — J309 Allergic rhinitis, unspecified: Secondary | ICD-10-CM | POA: Diagnosis not present

## 2015-03-27 DIAGNOSIS — J309 Allergic rhinitis, unspecified: Secondary | ICD-10-CM | POA: Diagnosis not present

## 2015-04-04 DIAGNOSIS — M25612 Stiffness of left shoulder, not elsewhere classified: Secondary | ICD-10-CM | POA: Diagnosis not present

## 2015-04-04 DIAGNOSIS — M5412 Radiculopathy, cervical region: Secondary | ICD-10-CM | POA: Diagnosis not present

## 2015-04-10 NOTE — Patient Instructions (Addendum)
YOUR PROCEDURE IS SCHEDULED ON : 04/16/15  REPORT TO [facility] MAIN ENTRANCE FOLLOW SIGNS TO EAST ELEVATOR - GO TO 3rd FLOOR CHECK IN AT 3 EAST NURSES STATION (SHORT STAY) AT: 5:30 AM  CALL THIS NUMBER IF YOU HAVE PROBLEMS THE MORNING OF SURGERY [phone]  REMEMBER:ONLY 1 PER PERSON MAY GO TO SHORT STAY WITH YOU TO GET READY THE MORNING OF YOUR SURGERY  DO NOT EAT FOOD OR DRINK LIQUIDS AFTER MIDNIGHT  TAKE THESE MEDICINES THE MORNING OF SURGERY: FLONASE / GABAPENTIN / PROTONIX / ARMOUR   YOU MAY NOT HAVE ANY METAL ON YOUR BODY INCLUDING HAIR PINS AND PIERCING'S. DO NOT WEAR JEWELRY, MAKEUP, LOTIONS, POWDERS OR PERFUMES. DO NOT WEAR NAIL POLISH. DO NOT SHAVE 48 HRS PRIOR TO SURGERY. MEN MAY SHAVE FACE AND NECK.  DO NOT [place]. [place] IS NOT RESPONSIBLE FOR VALUABLES.  CONTACTS, DENTURES OR PARTIALS MAY NOT BE WORN TO SURGERY. LEAVE SUITCASE IN CAR. CAN BE BROUGHT TO ROOM AFTER SURGERY.  PATIENTS DISCHARGED THE DAY OF SURGERY WILL NOT BE ALLOWED TO DRIVE HOME.  PLEASE READ OVER THE FOLLOWING INSTRUCTION SHEETS _________________________________________________________________________________                                          [place] - PREPARING FOR SURGERY  Before surgery, you can play an important role.  Because skin is not sterile, your skin needs to be as free of germs as possible.  You can reduce the number of germs on your skin by washing with CHG (chlorahexidine gluconate) soap before surgery.  CHG is an antiseptic cleaner which kills germs and bonds with the skin to continue killing germs even after washing. Please DO NOT use if you have an allergy to CHG or antibacterial soaps.  If your skin becomes reddened/irritated stop using the CHG and inform your nurse when you arrive at Short Stay. Do not shave (including legs and underarms) for at least 48 hours prior to the first CHG shower.  You may shave your face. Please  follow these instructions carefully:   1.  Shower with CHG Soap the night before surgery and the  morning of Surgery.   2.  If you choose to wash your hair, wash your hair first as usual with your  normal  Shampoo.   3.  After you shampoo, rinse your hair and body thoroughly to remove the  shampoo.                                         4.  Use CHG as you would any other liquid soap.  You can apply chg directly  to the skin and wash . Gently wash with scrungie or clean wascloth    5.  Apply the CHG Soap to your body ONLY FROM THE NECK DOWN.   Do not use on open                           Wound or open sores. Avoid contact with eyes, ears mouth and genitals (private parts).                        Genitals (private  parts) with your normal soap.              6.  Wash thoroughly, paying special attention to the area where your surgery  will be performed.   7.  Thoroughly rinse your body with warm water from the neck down.   8.  DO NOT shower/wash with your normal soap after using and rinsing off  the CHG Soap .                9.  Pat yourself dry with a clean towel.             10.  Wear clean night clothes to bed after shower             11.  Place clean sheets on your bed the night of your first shower and do not  sleep with pets.  Day of Surgery : Do not apply any lotions/deodorants the morning of surgery.  Please wear clean clothes to the hospital/surgery center.  FAILURE TO FOLLOW THESE INSTRUCTIONS MAY RESULT IN THE CANCELLATION OF YOUR SURGERY    PATIENT SIGNATURE_________________________________  ______________________________________________________________________

## 2015-04-11 ENCOUNTER — Encounter (HOSPITAL_COMMUNITY): Payer: Self-pay

## 2015-04-11 ENCOUNTER — Encounter (HOSPITAL_COMMUNITY)
Admission: RE | Admit: 2015-04-11 | Discharge: 2015-04-11 | Disposition: A | Discharge status: Home / Self Care | Payer: PPO | Source: Ambulatory Visit | Attending: Surgery | Admitting: Surgery

## 2015-04-11 ENCOUNTER — Other Ambulatory Visit: Payer: Self-pay

## 2015-04-11 DIAGNOSIS — Z01812 Encounter for preprocedural laboratory examination: Secondary | ICD-10-CM | POA: Diagnosis not present

## 2015-04-11 DIAGNOSIS — K4020 Bilateral inguinal hernia, without obstruction or gangrene, not specified as recurrent: Secondary | ICD-10-CM | POA: Diagnosis not present

## 2015-04-11 DIAGNOSIS — Z01818 Encounter for other preprocedural examination: Secondary | ICD-10-CM | POA: Diagnosis not present

## 2015-04-11 DIAGNOSIS — K429 Umbilical hernia without obstruction or gangrene: Secondary | ICD-10-CM | POA: Diagnosis not present

## 2015-04-11 HISTORY — DX: Prediabetes: R73.03

## 2015-04-11 LAB — CBC
HEMATOCRIT: 45.7 % (ref 39.0–52.0)
HEMOGLOBIN: 15.1 g/dL (ref 13.0–17.0)
MCH: 29.8 pg (ref 26.0–34.0)
MCHC: 33 g/dL (ref 30.0–36.0)
MCV: 90.3 fL (ref 78.0–100.0)
Platelets: 347 10*3/uL (ref 150–400)
RBC: 5.06 MIL/uL (ref 4.22–5.81)
RDW: 13.1 % (ref 11.5–15.5)
WBC: 11.6 10*3/uL — ABNORMAL HIGH (ref 4.0–10.5)

## 2015-04-11 LAB — BASIC METABOLIC PANEL
Anion gap: 9 (ref 5–15)
BUN: 11 mg/dL (ref 6–20)
CHLORIDE: 104 mmol/L (ref 101–111)
CO2: 27 mmol/L (ref 22–32)
Calcium: 9.4 mg/dL (ref 8.9–10.3)
Creatinine, Ser: 0.79 mg/dL (ref 0.61–1.24)
GFR calc Af Amer: >60 mL/min (ref >60)
GFR calc non Af Amer: >60 mL/min (ref >60)
Glucose, Bld: 107 mg/dL — ABNORMAL HIGH (ref 65–99)
Potassium: 4 mmol/L (ref 3.5–5.1)
Sodium: 140 mmol/L (ref 135–145)

## 2015-04-11 NOTE — Progress Notes (Signed)
   04/11/15 1441  OBSTRUCTIVE SLEEP APNEA  Have you ever been diagnosed with sleep apnea through a sleep study? No  Do you snore loudly (loud enough to be heard through closed doors)?  0  Do you often feel tired, fatigued, or sleepy during the daytime (such as falling asleep during driving or talking to someone)? 1  Has anyone observed you stop breathing during your sleep? 0  Do you have, or are you being treated for high blood pressure? 1  BMI more than 35 kg/m2? 0  Age > 50 (1-yes) 1  Neck circumference greater than:Male 16 inches or larger, Male 17inches or larger? 1  Male Gender (Yes=1) 1  Obstructive Sleep Apnea Score 5  Score 5 or greater  Results sent to PCP

## 2015-04-12 LAB — HEMOGLOBIN A1C
HEMOGLOBIN A1C: 6.4 % — AB (ref 4.8–5.6)
Mean Plasma Glucose: 137 mg/dL

## 2015-04-16 ENCOUNTER — Ambulatory Visit (HOSPITAL_COMMUNITY): Payer: PPO | Admitting: Registered Nurse

## 2015-04-16 ENCOUNTER — Ambulatory Visit (HOSPITAL_COMMUNITY)
Admission: RE | Admit: 2015-04-16 | Discharge: 2015-04-16 | Disposition: A | Discharge status: Home / Self Care | Payer: PPO | Source: Ambulatory Visit | Attending: Surgery | Admitting: Surgery

## 2015-04-16 ENCOUNTER — Encounter (HOSPITAL_COMMUNITY)
Admission: RE | Disposition: A | Discharge status: Home / Self Care | Payer: Self-pay | Source: Ambulatory Visit | Attending: Surgery

## 2015-04-16 ENCOUNTER — Encounter (HOSPITAL_COMMUNITY): Payer: Self-pay | Admitting: *Deleted

## 2015-04-16 DIAGNOSIS — K4020 Bilateral inguinal hernia, without obstruction or gangrene, not specified as recurrent: Secondary | ICD-10-CM | POA: Insufficient documentation

## 2015-04-16 DIAGNOSIS — K219 Gastro-esophageal reflux disease without esophagitis: Secondary | ICD-10-CM | POA: Insufficient documentation

## 2015-04-16 DIAGNOSIS — K449 Diaphragmatic hernia without obstruction or gangrene: Secondary | ICD-10-CM | POA: Insufficient documentation

## 2015-04-16 DIAGNOSIS — K429 Umbilical hernia without obstruction or gangrene: Secondary | ICD-10-CM | POA: Insufficient documentation

## 2015-04-16 DIAGNOSIS — E669 Obesity, unspecified: Secondary | ICD-10-CM | POA: Insufficient documentation

## 2015-04-16 DIAGNOSIS — M1990 Unspecified osteoarthritis, unspecified site: Secondary | ICD-10-CM | POA: Diagnosis not present

## 2015-04-16 DIAGNOSIS — I252 Old myocardial infarction: Secondary | ICD-10-CM | POA: Diagnosis not present

## 2015-04-16 DIAGNOSIS — I10 Essential (primary) hypertension: Secondary | ICD-10-CM | POA: Insufficient documentation

## 2015-04-16 DIAGNOSIS — Z955 Presence of coronary angioplasty implant and graft: Secondary | ICD-10-CM | POA: Insufficient documentation

## 2015-04-16 DIAGNOSIS — Z7951 Long term (current) use of inhaled steroids: Secondary | ICD-10-CM | POA: Insufficient documentation

## 2015-04-16 DIAGNOSIS — I2510 Atherosclerotic heart disease of native coronary artery without angina pectoris: Secondary | ICD-10-CM | POA: Diagnosis not present

## 2015-04-16 DIAGNOSIS — Z79899 Other long term (current) drug therapy: Secondary | ICD-10-CM | POA: Insufficient documentation

## 2015-04-16 DIAGNOSIS — Z6830 Body mass index (BMI) 30.0-30.9, adult: Secondary | ICD-10-CM | POA: Diagnosis not present

## 2015-04-16 DIAGNOSIS — D176 Benign lipomatous neoplasm of spermatic cord: Secondary | ICD-10-CM | POA: Diagnosis not present

## 2015-04-16 DIAGNOSIS — E039 Hypothyroidism, unspecified: Secondary | ICD-10-CM | POA: Insufficient documentation

## 2015-04-16 DIAGNOSIS — Z87891 Personal history of nicotine dependence: Secondary | ICD-10-CM | POA: Diagnosis not present

## 2015-04-16 DIAGNOSIS — N400 Enlarged prostate without lower urinary tract symptoms: Secondary | ICD-10-CM | POA: Diagnosis not present

## 2015-04-16 HISTORY — PX: INGUINAL HERNIA REPAIR: SHX194

## 2015-04-16 HISTORY — PX: INSERTION OF MESH: SHX5868

## 2015-04-16 HISTORY — PX: UMBILICAL HERNIA REPAIR: SHX196

## 2015-04-16 SURGERY — REPAIR, HERNIA, INGUINAL, BILATERAL, LAPAROSCOPIC
Anesthesia: General

## 2015-04-16 MED ORDER — CEFAZOLIN SODIUM-DEXTROSE 2-3 GM-% IV SOLR
INTRAVENOUS | Status: AC
  Filled 2015-04-16: qty 50

## 2015-04-16 MED ORDER — METHOCARBAMOL 750 MG PO TABS: 750.0000 mg | ORAL_TABLET | Freq: Four times a day (QID) | ORAL | Status: DC | PRN

## 2015-04-16 MED ORDER — MENTHOL 3 MG MT LOZG: 1.0000 | LOZENGE | OROMUCOSAL | Status: DC | PRN

## 2015-04-16 MED ORDER — ACETAMINOPHEN 10 MG/ML IV SOLN
INTRAVENOUS | Status: AC
  Filled 2015-04-16: qty 100

## 2015-04-16 MED ORDER — SODIUM CHLORIDE 0.9 % IV SOLN: 250.0000 mL | INTRAVENOUS | Status: DC | PRN

## 2015-04-16 MED ORDER — IBUPROFEN 400 MG PO TABS: 400.0000 mg | ORAL_TABLET | Freq: Four times a day (QID) | ORAL | Status: DC | PRN

## 2015-04-16 MED ORDER — SODIUM CHLORIDE 0.9 % IJ SOLN
INTRAMUSCULAR | Status: AC
  Filled 2015-04-16: qty 10

## 2015-04-16 MED ORDER — ACETAMINOPHEN 650 MG RE SUPP
650.0000 mg | Freq: Four times a day (QID) | RECTAL | Status: DC | PRN
  Filled 2015-04-16: qty 1

## 2015-04-16 MED ORDER — POLYETHYLENE GLYCOL 3350 17 G PO PACK
17.0000 g | PACK | Freq: Two times a day (BID) | ORAL | Status: DC | PRN
  Filled 2015-04-16: qty 1

## 2015-04-16 MED ORDER — FENTANYL CITRATE (PF) 250 MCG/5ML IJ SOLN
INTRAMUSCULAR | Status: AC
  Filled 2015-04-16: qty 5

## 2015-04-16 MED ORDER — CHLORHEXIDINE GLUCONATE 4 % EX LIQD: 1.0000 "application " | Freq: Once | CUTANEOUS | Status: DC

## 2015-04-16 MED ORDER — HYDROMORPHONE HCL 1 MG/ML IJ SOLN
INTRAMUSCULAR | Status: AC
  Filled 2015-04-16: qty 1

## 2015-04-16 MED ORDER — LIP MEDEX EX OINT: 1.0000 "application " | TOPICAL_OINTMENT | Freq: Two times a day (BID) | CUTANEOUS | Status: DC

## 2015-04-16 MED ORDER — HYDROMORPHONE HCL 1 MG/ML IJ SOLN
0.2500 mg | INTRAMUSCULAR | Status: DC | PRN
  Administered 2015-04-16 (×2): 0.5 mg via INTRAVENOUS

## 2015-04-16 MED ORDER — HYDROMORPHONE HCL 1 MG/ML IJ SOLN: 0.5000 mg | INTRAMUSCULAR | Status: DC | PRN

## 2015-04-16 MED ORDER — EPHEDRINE SULFATE 50 MG/ML IJ SOLN
INTRAMUSCULAR | Status: AC
  Filled 2015-04-16: qty 1

## 2015-04-16 MED ORDER — MIDAZOLAM HCL 2 MG/2ML IJ SOLN: 0.5000 mg | Freq: Once | INTRAMUSCULAR | Status: DC | PRN

## 2015-04-16 MED ORDER — MEPERIDINE HCL 50 MG/ML IJ SOLN
6.2500 mg | INTRAMUSCULAR | Status: DC | PRN
  Administered 2015-04-16: 12.5 mg via INTRAVENOUS

## 2015-04-16 MED ORDER — PROPOFOL 10 MG/ML IV BOLUS
INTRAVENOUS | Status: DC | PRN
Start: 2015-04-16 — End: 2015-04-16
  Administered 2015-04-16: 200 mg via INTRAVENOUS

## 2015-04-16 MED ORDER — CEFAZOLIN SODIUM-DEXTROSE 2-3 GM-% IV SOLR
2.0000 g | INTRAVENOUS | Status: AC
  Administered 2015-04-16: 2 g via INTRAVENOUS

## 2015-04-16 MED ORDER — PROPOFOL 10 MG/ML IV BOLUS
INTRAVENOUS | Status: AC
  Filled 2015-04-16: qty 20

## 2015-04-16 MED ORDER — ROCURONIUM BROMIDE 100 MG/10ML IV SOLN
INTRAVENOUS | Status: AC
  Filled 2015-04-16: qty 1

## 2015-04-16 MED ORDER — LACTATED RINGERS IV BOLUS (SEPSIS): 1000.0000 mL | Freq: Three times a day (TID) | INTRAVENOUS | Status: DC | PRN

## 2015-04-16 MED ORDER — MEPERIDINE HCL 50 MG/ML IJ SOLN: 6.2500 mg | INTRAMUSCULAR | Status: DC | PRN

## 2015-04-16 MED ORDER — SODIUM CHLORIDE 0.9% FLUSH: 3.0000 mL | INTRAVENOUS | Status: DC | PRN

## 2015-04-16 MED ORDER — ATROPINE SULFATE 0.4 MG/ML IJ SOLN
INTRAMUSCULAR | Status: AC
  Filled 2015-04-16: qty 2

## 2015-04-16 MED ORDER — PROMETHAZINE HCL 25 MG/ML IJ SOLN
INTRAMUSCULAR | Status: AC
  Filled 2015-04-16: qty 1

## 2015-04-16 MED ORDER — ACETAMINOPHEN 10 MG/ML IV SOLN
1000.0000 mg | Freq: Once | INTRAVENOUS | Status: AC
  Administered 2015-04-16: 1000 mg via INTRAVENOUS

## 2015-04-16 MED ORDER — PSYLLIUM 0.52 G PO CAPS
2.0800 g | ORAL_CAPSULE | Freq: Every day | ORAL | Status: DC
Start: 2015-04-16 — End: 2015-04-16

## 2015-04-16 MED ORDER — GLYCOPYRROLATE 0.2 MG/ML IJ SOLN
INTRAMUSCULAR | Status: AC
  Filled 2015-04-16: qty 1

## 2015-04-16 MED ORDER — MEPERIDINE HCL 50 MG/ML IJ SOLN
INTRAMUSCULAR | Status: AC
  Filled 2015-04-16: qty 1

## 2015-04-16 MED ORDER — SODIUM CHLORIDE 0.9 % IR SOLN
Status: DC | PRN
  Administered 2015-04-16: 1000 mL

## 2015-04-16 MED ORDER — PROMETHAZINE HCL 25 MG/ML IJ SOLN: 6.2500 mg | INTRAMUSCULAR | Status: DC | PRN

## 2015-04-16 MED ORDER — ACETAMINOPHEN 325 MG PO TABS: 325.0000 mg | ORAL_TABLET | Freq: Four times a day (QID) | ORAL | Status: DC | PRN

## 2015-04-16 MED ORDER — METHOCARBAMOL 1000 MG/10ML IJ SOLN
1000.0000 mg | Freq: Four times a day (QID) | INTRAMUSCULAR | Status: DC | PRN
  Filled 2015-04-16: qty 10

## 2015-04-16 MED ORDER — ROCURONIUM BROMIDE 100 MG/10ML IV SOLN
INTRAVENOUS | Status: DC | PRN
  Administered 2015-04-16: 50 mg via INTRAVENOUS
  Administered 2015-04-16 (×3): 10 mg via INTRAVENOUS

## 2015-04-16 MED ORDER — LIDOCAINE HCL (CARDIAC) 20 MG/ML IV SOLN
INTRAVENOUS | Status: AC
  Filled 2015-04-16: qty 5

## 2015-04-16 MED ORDER — GABAPENTIN 300 MG PO CAPS
600.0000 mg | ORAL_CAPSULE | Freq: Four times a day (QID) | ORAL | Status: DC
  Filled 2015-04-16 (×6): qty 2

## 2015-04-16 MED ORDER — SUGAMMADEX SODIUM 200 MG/2ML IV SOLN
INTRAVENOUS | Status: AC
  Filled 2015-04-16: qty 2

## 2015-04-16 MED ORDER — SODIUM CHLORIDE 0.9 % IJ SOLN
INTRAMUSCULAR | Status: AC
  Filled 2015-04-16: qty 100

## 2015-04-16 MED ORDER — MIDAZOLAM HCL 2 MG/2ML IJ SOLN
INTRAMUSCULAR | Status: AC
  Filled 2015-04-16: qty 2

## 2015-04-16 MED ORDER — OXYCODONE HCL 5 MG PO TABS: 5.0000 mg | ORAL_TABLET | ORAL | Status: DC | PRN

## 2015-04-16 MED ORDER — BUPIVACAINE-EPINEPHRINE 0.25% -1:200000 IJ SOLN
INTRAMUSCULAR | Status: DC | PRN
  Administered 2015-04-16: 60 mL
  Administered 2015-04-16: 30 mL

## 2015-04-16 MED ORDER — ONDANSETRON HCL 4 MG/2ML IJ SOLN
INTRAMUSCULAR | Status: AC
  Filled 2015-04-16: qty 2

## 2015-04-16 MED ORDER — SODIUM CHLORIDE 0.9 % IJ SOLN
INTRAMUSCULAR | Status: DC | PRN
  Administered 2015-04-16: 80 mL

## 2015-04-16 MED ORDER — MIDAZOLAM HCL 5 MG/5ML IJ SOLN
INTRAMUSCULAR | Status: DC | PRN
  Administered 2015-04-16: 2 mg via INTRAVENOUS

## 2015-04-16 MED ORDER — LIDOCAINE HCL (CARDIAC) 20 MG/ML IV SOLN
INTRAVENOUS | Status: DC | PRN
  Administered 2015-04-16: 100 mg via INTRAVENOUS

## 2015-04-16 MED ORDER — SUGAMMADEX SODIUM 200 MG/2ML IV SOLN
INTRAVENOUS | Status: DC | PRN
  Administered 2015-04-16: 200 mg via INTRAVENOUS

## 2015-04-16 MED ORDER — BUPIVACAINE LIPOSOME 1.3 % IJ SUSP
INTRAMUSCULAR | Status: DC | PRN
  Administered 2015-04-16: 20 mL

## 2015-04-16 MED ORDER — PROMETHAZINE HCL 25 MG/ML IJ SOLN
6.2500 mg | INTRAMUSCULAR | Status: DC | PRN
  Administered 2015-04-16: 6.25 mg via INTRAVENOUS

## 2015-04-16 MED ORDER — PHENOL 1.4 % MT LIQD: 2.0000 | OROMUCOSAL | Status: DC | PRN

## 2015-04-16 MED ORDER — FENTANYL CITRATE (PF) 100 MCG/2ML IJ SOLN
INTRAMUSCULAR | Status: DC | PRN
  Administered 2015-04-16 (×4): 50 ug via INTRAVENOUS

## 2015-04-16 MED ORDER — ONDANSETRON HCL 4 MG/2ML IJ SOLN
INTRAMUSCULAR | Status: DC | PRN
  Administered 2015-04-16: 4 mg via INTRAVENOUS

## 2015-04-16 MED ORDER — EPHEDRINE SULFATE 50 MG/ML IJ SOLN
INTRAMUSCULAR | Status: DC | PRN
  Administered 2015-04-16 (×3): 5 mg via INTRAVENOUS

## 2015-04-16 MED ORDER — LACTATED RINGERS IV SOLN
INTRAVENOUS | Status: DC | PRN
  Administered 2015-04-16: 07:00:00 via INTRAVENOUS

## 2015-04-16 MED ORDER — ALUM & MAG HYDROXIDE-SIMETH 200-200-20 MG/5ML PO SUSP
30.0000 mL | Freq: Four times a day (QID) | ORAL | Status: DC | PRN
  Filled 2015-04-16: qty 30

## 2015-04-16 MED ORDER — LACTATED RINGERS IV SOLN
INTRAVENOUS | Status: DC
  Administered 2015-04-16: 12:00:00 via INTRAVENOUS

## 2015-04-16 MED ORDER — METHOCARBAMOL 500 MG PO TABS: 1000.0000 mg | ORAL_TABLET | Freq: Four times a day (QID) | ORAL | Status: DC | PRN

## 2015-04-16 MED ORDER — HYDROMORPHONE HCL 1 MG/ML IJ SOLN: 0.2500 mg | INTRAMUSCULAR | Status: DC | PRN

## 2015-04-16 MED ORDER — BUPIVACAINE LIPOSOME 1.3 % IJ SUSP
20.0000 mL | INTRAMUSCULAR | Status: DC
  Filled 2015-04-16: qty 20

## 2015-04-16 MED ORDER — SODIUM CHLORIDE 0.9% FLUSH: 3.0000 mL | Freq: Two times a day (BID) | INTRAVENOUS | Status: DC

## 2015-04-16 MED ORDER — BUPIVACAINE-EPINEPHRINE (PF) 0.25% -1:200000 IJ SOLN
INTRAMUSCULAR | Status: AC
  Filled 2015-04-16: qty 90

## 2015-04-16 MED ORDER — OXYCODONE HCL 5 MG PO TABS
5.0000 mg | ORAL_TABLET | ORAL | Status: DC | PRN
  Administered 2015-04-16: 5 mg via ORAL
  Filled 2015-04-16: qty 1

## 2015-04-16 MED ORDER — MAGIC MOUTHWASH
15.0000 mL | Freq: Four times a day (QID) | ORAL | Status: DC | PRN
  Filled 2015-04-16: qty 15

## 2015-04-16 SURGICAL SUPPLY — 48 items
APPLIER CLIP 5 13 M/L LIGAMAX5 (MISCELLANEOUS)
BINDER ABDOMINAL 12 ML 46-62 (SOFTGOODS) ×4 IMPLANT
CABLE HIGH FREQUENCY MONO STRZ (ELECTRODE) ×4 IMPLANT
CHLORAPREP W/TINT 26ML (MISCELLANEOUS) ×4 IMPLANT
CLIP APPLIE 5 13 M/L LIGAMAX5 (MISCELLANEOUS) IMPLANT
CLOSURE WOUND 1/2 X4 (GAUZE/BANDAGES/DRESSINGS) ×2
COVER SURGICAL LIGHT HANDLE (MISCELLANEOUS) ×4 IMPLANT
DECANTER SPIKE VIAL GLASS SM (MISCELLANEOUS) ×4 IMPLANT
DEVICE SECURE STRAP 25 ABSORB (INSTRUMENTS) ×4 IMPLANT
DEVICE TROCAR PUNCTURE CLOSURE (ENDOMECHANICALS) ×4 IMPLANT
DRAPE LAPAROSCOPIC ABDOMINAL (DRAPES) ×4 IMPLANT
DRAPE WARM FLUID 44X44 (DRAPE) ×4 IMPLANT
DRSG TEGADERM 2-3/8X2-3/4 SM (GAUZE/BANDAGES/DRESSINGS) ×12 IMPLANT
DRSG TEGADERM 4X4.75 (GAUZE/BANDAGES/DRESSINGS) ×4 IMPLANT
ELECT REM PT RETURN 9FT ADLT (ELECTROSURGICAL) ×4
ELECTRODE REM PT RTRN 9FT ADLT (ELECTROSURGICAL) ×2 IMPLANT
GAUZE SPONGE 2X2 8PLY STRL LF (GAUZE/BANDAGES/DRESSINGS) IMPLANT
GLOVE ECLIPSE 8.0 STRL XLNG CF (GLOVE) ×4 IMPLANT
GLOVE INDICATOR 8.0 STRL GRN (GLOVE) ×4 IMPLANT
GOWN STRL REUS W/TWL XL LVL3 (GOWN DISPOSABLE) ×8 IMPLANT
KIT BASIN OR (CUSTOM PROCEDURE TRAY) ×4 IMPLANT
MARKER SKIN DUAL TIP RULER LAB (MISCELLANEOUS) ×4 IMPLANT
MESH ULTRAPRO 6X6 15CM15CM (Mesh General) ×12 IMPLANT
MESH VENTRALIGHT ST 4X6IN (Mesh General) ×4 IMPLANT
NEEDLE SPNL 22GX3.5 QUINCKE BK (NEEDLE) IMPLANT
PAD POSITIONING PINK XL (MISCELLANEOUS) ×4 IMPLANT
POSITIONER SURGICAL ARM (MISCELLANEOUS) IMPLANT
POUCH SPECIMEN RETRIEVAL 10MM (ENDOMECHANICALS) ×4 IMPLANT
SCISSORS LAP 5X35 DISP (ENDOMECHANICALS) ×4 IMPLANT
SET IRRIG TUBING LAPAROSCOPIC (IRRIGATION / IRRIGATOR) ×4 IMPLANT
SHEARS HARMONIC ACE PLUS 36CM (ENDOMECHANICALS) IMPLANT
SLEEVE XCEL OPT CAN 5 100 (ENDOMECHANICALS) ×8 IMPLANT
SPONGE GAUZE 2X2 STER 10/PKG (GAUZE/BANDAGES/DRESSINGS)
STRIP CLOSURE SKIN 1/2X4 (GAUZE/BANDAGES/DRESSINGS) ×6 IMPLANT
SUT MNCRL AB 4-0 PS2 18 (SUTURE) ×4 IMPLANT
SUT PDS AB 1 CT1 27 (SUTURE) ×8 IMPLANT
SUT PROLENE 1 CT 1 30 (SUTURE) ×28 IMPLANT
SUT VIC AB 3-0 SH 27 (SUTURE) ×2
SUT VIC AB 3-0 SH 27XBRD (SUTURE) ×2 IMPLANT
TACKER 5MM HERNIA 3.5CML NAB (ENDOMECHANICALS) IMPLANT
TAPE CLOTH 4X10 WHT NS (GAUZE/BANDAGES/DRESSINGS) IMPLANT
TOWEL OR 17X26 10 PK STRL BLUE (TOWEL DISPOSABLE) ×4 IMPLANT
TRAY LAPAROSCOPIC (CUSTOM PROCEDURE TRAY) ×4 IMPLANT
TROCAR BLADELESS OPT 5 100 (ENDOMECHANICALS) ×4 IMPLANT
TROCAR CANNULA W/PORT DUAL 5MM (MISCELLANEOUS) IMPLANT
TROCAR XCEL BLUNT TIP 100MML (ENDOMECHANICALS) ×4 IMPLANT
TROCAR XCEL NON-BLD 11X100MML (ENDOMECHANICALS) IMPLANT
TUBING INSUF HEATED (TUBING) ×4 IMPLANT

## 2015-04-16 NOTE — Anesthesia Preprocedure Evaluation (Addendum)
Anesthesia Evaluation  Patient identified by MRN, date of birth, ID band Patient awake    Reviewed: Allergy & Precautions, NPO status , Patient's Chart, lab work & pertinent test results  History of Anesthesia Complications Negative for: history of anesthetic complications  Airway Mallampati: I  TM Distance: >3 FB Neck ROM: Full    Dental  (+) Edentulous Upper, Edentulous Lower   Pulmonary former smoker (quit 1996),    breath sounds clear to auscultation       Cardiovascular hypertension, Pt. on medications (-) angina+ CAD, + Past MI and + Cardiac Stents (1996)   Rhythm:Regular Rate:Normal  Pt sees MD in [place] every month, denies chest pain, walks 3 miles/day, can do stairs, totally asymptomatic   Neuro/Psych Anxiety Depression negative neurological ROS     GI/Hepatic Neg liver ROS, hiatal hernia, GERD  Medicated and Controlled,  Endo/Other  Hypothyroidism Morbid obesity  Renal/GU negative Renal ROS     Musculoskeletal  (+) Arthritis , Osteoarthritis,    Abdominal (+) + obese,   Peds  Hematology negative hematology ROS (+)   Anesthesia Other Findings   Reproductive/Obstetrics                           Anesthesia Physical Anesthesia Plan  ASA: III  Anesthesia Plan: General   Post-op Pain Management:    Induction: Intravenous  Airway Management Planned: Oral ETT  Additional Equipment:   Intra-op Plan:   Post-operative Plan: Extubation in OR  Informed Consent: I have reviewed the patients History and Physical, chart, labs and discussed the procedure including the risks, benefits and alternatives for the proposed anesthesia with the patient or authorized representative who has indicated his/her understanding and acceptance.   Dental advisory given  Plan Discussed with: CRNA and Surgeon  Anesthesia Plan Comments: (Plan routine monitors, GETA)       Anesthesia Quick  Evaluation

## 2015-04-16 NOTE — H&P (View-Only) (Signed)
Cindy Hazy. Bonneau 03/19/2015 8:49 AM Location: [facility] Patient #: [id number] DOB: 06/29/1950 Divorced / Language: Cleophus Molt / Race: White Male  Patient Care Team: Imagene Riches, NP as PCP - General Michael Boston, MD as Consulting Physician (General Surgery) Franchot Gallo, MD as Consulting Physician (Urology) Wylene Simmer, MD as Consulting Physician (Orthopedic Surgery)   History of Present Illness [name] MD; 03/19/2015 9:28 AM) The patient is a 65 year old male who presents with an inguinal hernia. Note for "Inguinal hernia": Patient sent for surgical consultation by his urologist, Dr. Murriel Hopper, for concern of umbilical hernia LEFT inguinal hernia.  Active male. History with prostatic hyperplasia status post transurethral prostatic resection times 2, last 2015. No major urinary problems now. Apparently hernias were incidentally noted on the PET scan being worked up for lung mass. As a year or so ago. Has noted some occasional discomfort at his belly button especially his LEFT groin. The his RIGHT groin is become sensitive. Noted on exam by urologist to have a LEFT inguinal hernia and probable umbilical hernia. Surgical consultation requested. Patient is retired. He is usually done a lot of heavy lifting and intense activity in his life. Regularly but has backed off in the wintertime. Trying to get back on the treadmill. Not smoked in several decades. It any abdominal surgery. He did have a myocardial infarction in the distant past. No recent cardiac or stroke issues. He is not on blood thinners. No severe constipation or diarrhea.   Allergies (Sonya Bynum, CMA; 03/19/2015 8:50 AM) No Known Drug Allergies 03/19/2015  Medication History (Sonya Bynum, CMA; 03/19/2015 8:52 AM) Gabapentin (600MG  Tablet, Oral) Active. Citalopram Hydrobromide (40MG  Tablet, Oral) Active. Flonase Allergy Relief (50MCG/ACT Suspension, Nasal) Active. Losartan  Potassium (100MG  Tablet, Oral) Active. Omeprazole (10MG  Capsule DR, Oral) Active. Armour Thyroid (60MG  Tablet, Oral) Active. Medications Reconciled    Vitals (Sonya Bynum CMA; 03/19/2015 8:50 AM) 03/19/2015 8:50 AM Weight: 207 lb Height: 70in Body Surface Area: 2.12 m Body Mass Index: 29.7 kg/m  Temp.: 32F(Temporal)  Pulse: 79 (Regular)  BP: 128/76 (Sitting, Left Arm, Standard)      Physical Exam [name] MD; 03/19/2015 9:29 AM)  General Mental Status-Alert. General Appearance-Not in acute distress, Not Sickly. Orientation-Oriented X3. Hydration-Well hydrated. Voice-Normal.  Integumentary Global Assessment Upon inspection and palpation of skin surfaces of the - Axillae: non-tender, no inflammation or ulceration, no drainage. and Distribution of scalp and body hair is normal. General Characteristics Temperature - normal warmth is noted.  Head and Neck Head-normocephalic, atraumatic with no lesions or palpable masses. Face Global Assessment - atraumatic, no absence of expression. Neck Global Assessment - no abnormal movements, no bruit auscultated on the right, no bruit auscultated on the left, no decreased range of motion, non-tender. Trachea-midline. Thyroid Gland Characteristics - non-tender.  Eye Eyeball - Left-Extraocular movements intact, No Nystagmus. Eyeball - Right-Extraocular movements intact, No Nystagmus. Cornea - Left-No Hazy. Cornea - Right-No Hazy. Sclera/Conjunctiva - Left-No scleral icterus, No Discharge. Sclera/Conjunctiva - Right-No scleral icterus, No Discharge. Pupil - Left-Direct reaction to light normal. Pupil - Right-Direct reaction to light normal.  ENMT Ears Pinna - Left - no drainage observed, no generalized tenderness observed. Right - no drainage observed, no generalized tenderness observed. Nose and Sinuses External Inspection of the Nose - no destructive lesion observed.  Inspection of the nares - Left - quiet respiration. Right - quiet respiration. Mouth and Throat Lips - Upper Lip - no fissures observed, no pallor noted. Lower Lip -  no fissures observed, no pallor noted. Nasopharynx - no discharge present. Oral Cavity/Oropharynx - Tongue - no dryness observed. Oral Mucosa - no cyanosis observed. Hypopharynx - no evidence of airway distress observed.  Chest and Lung Exam Inspection Movements - Normal and Symmetrical. Accessory muscles - No use of accessory muscles in breathing. Palpation Palpation of the chest reveals - Non-tender. Auscultation Breath sounds - Normal and Clear.  Cardiovascular Auscultation Rhythm - Regular. Murmurs & Other Heart Sounds - Auscultation of the heart reveals - No Murmurs and No Systolic Clicks.  Abdomen Inspection Inspection of the abdomen reveals - No Visible peristalsis and No Abnormal pulsations. Umbilicus - No Bleeding, No Urine drainage. Palpation/Percussion Palpation and Percussion of the abdomen reveal - Soft, Non Tender, No Rebound tenderness, No Rigidity (guarding) and No Cutaneous hyperesthesia. Note: Mild suprapubic umbilical diastases recti with 1.5 centimeter umbilical hernia on Valsalva. Reducible. Overweight but soft abdomen. No guarding. No rebound.  Male Genitourinary Note: Normal external male genitalia. Obvious LEFT inguinal hernia to mid groin - very sensitive but reducible. Sensitive RIGHT groin with subtle impulse on RIGHT side suspicious for early RIGHT inguinal hernia. Epididymides, spermatic cords, and testes normal.  Peripheral Vascular Upper Extremity Inspection - Left - No Cyanotic nailbeds, Not Ischemic. Right - No Cyanotic nailbeds, Not Ischemic.  Neurologic Neurologic evaluation reveals -normal attention span and ability to concentrate, able to name objects and repeat phrases. Appropriate fund of knowledge , normal sensation and normal coordination. Mental Status Affect - not angry, not  paranoid. Cranial Nerves-Normal Bilaterally. Gait-Normal.  Neuropsychiatric Mental status exam performed with findings of-able to articulate well with normal speech/language, rate, volume and coherence, thought content normal with ability to perform basic computations and apply abstract reasoning and no evidence of hallucinations, delusions, obsessions or homicidal/suicidal ideation. Note: Sometimes tangential in his speech but pleasant.  Musculoskeletal Global Assessment Spine, Ribs and Pelvis - no instability, subluxation or laxity. Right Upper Extremity - no instability, subluxation or laxity.  Lymphatic Head & Neck  General Head & Neck Lymphatics: Bilateral - Description - No Localized lymphadenopathy. Axillary  General Axillary Region: Bilateral - Description - No Localized lymphadenopathy. Femoral & Inguinal  Generalized Femoral & Inguinal Lymphatics: Left - Description - No Localized lymphadenopathy. Right - Description - No Localized lymphadenopathy.    Assessment & Plan [name] MD; 03/19/2015 9:30 AM)  LEFT INGUINAL HERNIA (K40.90) Impression: Obvious LEFT inguinal hernia and possible RIGHT inguinal hernia as well. Plan laparoscopic preperitoneal repair.  UMBILICAL HERNIA WITHOUT OBSTRUCTION AND WITHOUT GANGRENE (K42.9) Impression: Small but sensitive umbilical hernia in the setting of diastases recti. Plan repair at that time. She'll to use mesh since his intense activity level and diastases and obesity.  ENCOUNTER FOR PRE-OPERATIVE EXAMINATION (Z01.818)  Current Plans You are being scheduled for surgery - Our schedulers will call you.  You should hear from our office's scheduling department within 5 working days about the location, date, and time of surgery. We try to make accommodations for patient's preferences in scheduling surgery, but sometimes the OR schedule or the surgeon's schedule prevents [place] from making those accommodations.  If you have  not heard from our office [phone] in 5 working days, call the office and ask for your surgeon's nurse.  If you have other questions about your diagnosis, plan, or surgery, call the office and ask for your surgeon's nurse.  Written instructions provided Pt Education - Pamphlet Given - Laparoscopic Hernia Repair: discussed with patient and provided information. The anatomy & physiology of  the abdominal wall and pelvic floor was discussed. The pathophysiology of hernias in the inguinal and pelvic region was discussed. Natural history risks such as progressive enlargement, pain, incarceration, and strangulation was discussed. Contributors to complications such as smoking, obesity, diabetes, prior surgery, etc were discussed.  I feel the risks of no intervention will lead to serious problems that outweigh the operative risks; therefore, I recommended surgery to reduce and repair the hernia. I explained laparoscopic techniques with possible need for an open approach. I noted usual use of mesh to patch and/or buttress hernia repair  Risks such as bleeding, infection, abscess, need for further treatment, heart attack, death, and other risks were discussed. I noted a good likelihood this will help address the problem. Goals of post-operative recovery were discussed as well. Possibility that this will not correct all symptoms was explained. I stressed the importance of low-impact activity, aggressive pain control, avoiding constipation, & not pushing through pain to minimize risk of post-operative chronic pain or injury. Possibility of reherniation was discussed. We will work to minimize complications.  An educational handout further explaining the pathology & treatment options was given as well. Questions were answered. The patient expresses understanding & wishes to proceed with surgery.  Pt Education - [facility] Hernia Post-Op HCI ([place]): discussed with patient and provided information. Pt Education - [facility]  Pain Control (Chamara Dyck)  [name], M.D., F.A.C.S. Gastrointestinal and Minimally Invasive Surgery [facility] [address], [place], [place] [address] [phone] Main / Paging

## 2015-04-16 NOTE — Interval H&P Note (Signed)
History and Physical Interval Note:  04/16/2015 7:35 AM  Larry Randall  has presented today for surgery, with the diagnosis of Bilateral inguinal hernias, umbilical hernia  The various methods of treatment have been discussed with the patient and family. After consideration of risks, benefits and other options for treatment, the patient has consented to  Procedure(s): LAPAROSCOPIC BILATERAL INGUINAL HERNIA REPAIR (Bilateral) LAPAROSCOPIC UMBILICAL HERNIA (N/A) INSERTION OF MESH (Bilateral) as a surgical intervention .  The patient's history has been reviewed, patient examined, no change in status, stable for surgery.  I have reviewed the patient's chart and labs.  Questions were answered to the patient's satisfaction.     Sigmund Morera C.

## 2015-04-16 NOTE — Op Note (Signed)
04/16/2015  10:58 AM  PATIENT:  Larry Randall  65 y.o. male  Patient Care Team: Imagene Riches, NP as PCP - General Michael Boston, MD as Consulting Physician (General Surgery) Franchot Gallo, MD as Consulting Physician (Urology) Wylene Simmer, MD as Consulting Physician (Orthopedic Surgery)  PRE-OPERATIVE DIAGNOSIS:  Bilateral inguinal hernias, umbilical hernia  POST-OPERATIVE DIAGNOSIS:  Bilateral inguinal hernias, umbilical hernia  PROCEDURE:   LAPAROSCOPIC BILATERAL INGUINAL HERNIA REPAIR LAPAROSCOPIC UMBILICAL HERNIA REPAIR WITH MESH INSERTION OF MESH  SURGEON:  Surgeon(s): Michael Boston, MD  ASSISTANT: RN   ANESTHESIA:   Regional ilioinguinal and genitofemoral and spermatic cord nerve blocks with GETA  EBL:  Total I/O In: 1000 [I.V.:1000] Out: -   Delay start of Pharmacological VTE agent (>24hrs) due to surgical blood loss or risk of bleeding:  no  DRAINS: NONE  SPECIMEN:  NONE  DISPOSITION OF SPECIMEN:  N/A  COUNTS:  YES  PLAN OF CARE: Discharge to home after PACU  PATIENT DISPOSITION:  PACU - hemodynamically stable.  INDICATION:   The anatomy & physiology of the abdominal wall and pelvic floor was discussed.  The pathophysiology of hernias in the inguinal and pelvic region was discussed.  Natural history risks such as progressive enlargement, pain, incarceration & strangulation was discussed.   Contributors to complications such as smoking, obesity, diabetes, prior surgery, etc were discussed.    I feel the risks of no intervention will lead to serious problems that outweigh the operative risks; therefore, I recommended surgery to reduce and repair the hernia.  I explained laparoscopic techniques with possible need for an open approach.  I noted usual use of mesh to patch and/or buttress hernia repair  Risks such as bleeding, infection, abscess, need for further treatment, heart attack, death, and other risks were discussed.  I noted a good likelihood this  will help address the problem.   Goals of post-operative recovery were discussed as well.  Possibility that this will not correct all symptoms was explained.  I stressed the importance of low-impact activity, aggressive pain control, avoiding constipation, & not pushing through pain to minimize risk of post-operative chronic pain or injury. Possibility of reherniation was discussed.  We will work to minimize complications.     An educational handout further explaining the pathology & treatment options was given as well.  Questions were answered.  The patient expresses understanding & wishes to proceed with surgery.  OR FINDINGS: Bilateral indirect inguinal hernias.  Some direct space hernias as well bilaterally.  No definite femoral or obturator hernias.  2 x 2 centimeter periumbilical ventral hernia.  Type of repair - Laparoscopic underlay repair   Name of mesh -  Bard Ventralight dual sided (polypropylene / Seprafilm)  Size of mesh - Height 10 cm, Width 15 cm  Mesh overlap - 4-6 cm  Placement of mesh - Intraperitoneal underlay repair   DESCRIPTION:   The patient was identified & brought into the operating room. The patient was positioned supine with arms tucked. SCDs were active during the entire case. The patient underwent general anesthesia without any difficulty.  The abdomen was prepped and draped in a sterile fashion. The patient's bladder was emptied.  A Surgical Timeout confirmed our plan.  I made a transverse incision through the inferior umbilical fold.  I made a small transverse nick through the anterior rectus fascia contralateral to the inguinal hernia side and placed a 0-vicryl stitch through the fascia.  I placed a Hasson trocar into the preperitoneal plane.  Entry was clean.  We induced carbon dioxide insufflation. Camera inspection revealed no injury.  I used a 70mm angled scope to bluntly free the peritoneum off the infraumbilical anterior abdominal wall.  I created enough  of a preperitoneal pocket to place 24mm ports into the right & left mid-abdomen into this preperitoneal cavity.  I focused attention on the LEFT side since that was the dominant hernia side.   I used blunt & focused sharp dissection to free the peritoneum off the flank and down to the pubic rim.  I freed the anteriolateral bladder wall off the anteriolateral pelvic wall, sparing midline attachments.   I located a swath of peritoneum going into a hernia fascial defect at the internal ring consistent with an indirect inguinal hernia.  some direct space hernia laxity as well.  I gradually freed the peritoneal hernia sac off safely and reduced it into the preperitoneal space.  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  These were isolated & skeletonized.   I freed the peritoneum off the spermatic vessels & vas deferens.  I freed peritoneum off the retroperitoneum along the psoas muscle.    I checked & assured hemostasis.    I turned attention on the opposite side.  I did dissection in a similar, mirror-image fashion. The patient had a  similar pantaloon type hernia.  He also had a few moderate spermatic cord lipomas though not as large as on the left side.  I did do a high ligation on this side after consenting the spermatic cord off and closing the peritoneum with 3-0 Vicryl suture using intracorporeal laparoscopic suturing.  I placed the spermatic cord lipomas inside an Endo Catch bag and removed it out the umbilical port.   that Endo Catch bag was completely filled with spermatic cord lipomas, a large volume.          I chose 15x15 cm sheets of ultra-lightweight polypropylene mesh (Ultrapro), one for each side.  I cut a single sigmoid-shaped slit ~6cm from a corner of each mesh.  I placed the meshes into the preperitoneal space & laid them as overlapping diamonds such that at the inferior points, a 6x6 cm corner flap rested in the true anterolateral pelvis, covering the obturator & femoral  foramina.   I allowed the bladder to return to the pubis, this helping tuck the corners of the mesh in the anteriolateral pelvis.  The medial corners overlapped each other across midline cephalad to the pubic rim.   This provided >2 inch coverage around the hernia.   I placed a third mesh as a diamond along the midline so that its lateral wings would overlap over the direct spaces and internal hernias.  This provided much better overlap.  Because the defects well covered and not particularly large, I did not place any tacks.  I held the hernia sacs cephalad & evacuated carbon dioxide.  I redirected the umbilical port into the peritoneal cavity and reintroduced carbon dioxide.  I could see the hernia in the periumbilical abdomen.  I mapped out the region using a needle passer.   To ensure that I would have at least 5 cm radial coverage outside of the hernia defect, I chose a 15x10 cm dual sided mesh.  I placed #1 Prolene stitches around its edge about every 5 cm = 10 total.  I rolled the mesh & placed into the peritoneal cavity through the 10 cm fascial defect.  I unrolled  the mesh and positioned  it appropriately.  I secured the mesh to cover up the hernia defect using a laparoscopic suture passer to pass the tails of the Prolene through the abdominal wall & tagged them with clamps.  I started out in four corners to make sure I had the mesh centered under the hernia defect appropriately, and then proceeded to work in quadrants.  We evacuated CO2 & desufflated the abdomen.  I tied the fascial stitches down.   I primarily closed the umbilical hernia with #1 PDS.   I reinsufflated the abdomen.   I placed a #1 Prolene through the umbilical closure with a laparoscopic suture passer.  The mesh provided at least 5-10 cm circumferential coverage around the entire region of hernia defects.   I tacked the edges & central part of the mesh to the peritoneum/posterior rectus fascia with SecureStrap absorbable tacks.    Hemostasis was excellent.   I did reinspection. Hemostasis was good. Mesh laid well.   Capnoperitoneum was evacuated. Ports were removed. The skin was closed with Monocryl at the port sites and Steri-Strips on the fascial stitch puncture sites.  Patient is being extubated to go to the recovery room. I updated the status of the patient to the patient's son.  I made recommendations.  I answered questions.  Understanding & appreciation was expressed.  [name], M.D., F.A.C.S. Gastrointestinal and Minimally Invasive Surgery [facility] [address], [place], [place] [address] [phone] Main / Paging

## 2015-04-16 NOTE — Anesthesia Procedure Notes (Signed)
Procedure Name: Intubation Date/Time: 04/16/2015 7:56 AM Performed by: Carleene Cooper A Pre-anesthesia Checklist: Patient identified, Timeout performed, Emergency Drugs available, Suction available and Patient being monitored Patient Re-evaluated:Patient Re-evaluated prior to inductionOxygen Delivery Method: Circle system utilized Preoxygenation: Pre-oxygenation with 100% oxygen Intubation Type: IV induction Ventilation: Mask ventilation without difficulty Laryngoscope Size: Mac and 4 Grade View: Grade I Tube type: Oral Tube size: 7.5 mm Number of attempts: 1 Airway Equipment and Method: Stylet Placement Confirmation: ETT inserted through vocal cords under direct vision,  breath sounds checked- equal and bilateral and positive ETCO2 Secured at: 22 cm Tube secured with: Tape Dental Injury: Teeth and Oropharynx as per pre-operative assessment

## 2015-04-16 NOTE — Transfer of Care (Signed)
Immediate Anesthesia Transfer of Care Note  Patient: Larry Randall  Procedure(s) Performed: Procedure(s): LAPAROSCOPIC BILATERAL INGUINAL HERNIA REPAIR (Bilateral) LAPAROSCOPIC UMBILICAL HERNIA (N/A) INSERTION OF MESH (Bilateral)  Patient Location: PACU  Anesthesia Type:General  Level of Consciousness: awake, alert , oriented and patient cooperative  Airway & Oxygen Therapy: Patient Spontanous Breathing and Patient connected to face mask oxygen  Post-op Assessment: Report given to RN, Post -op Vital signs reviewed and stable and Patient moving all extremities  Post vital signs: Reviewed and stable  Last Vitals:  Filed Vitals:   04/16/15 0540 04/16/15 0646  BP: 143/91 130/78  Pulse: 82 71  Temp: 36.3 C 36.8 C  Resp: 18 16    Complications: No apparent anesthesia complications

## 2015-04-16 NOTE — Discharge Instructions (Signed)
HERNIA REPAIR: POST OP INSTRUCTIONS ° °1. DIET: Follow a light bland diet the first 24 hours after arrival home, such as soup, liquids, crackers, etc.  Be sure to include lots of fluids daily.  Avoid fast food or heavy meals as your are more likely to get nauseated.  Eat a low fat the next few days after surgery. °2. Take your usually prescribed home medications unless otherwise directed. °3. PAIN CONTROL: °a. Pain is best controlled by a usual combination of three different methods TOGETHER: °i. Ice/Heat °ii. Over the counter pain medication °iii. Prescription pain medication °b. Most patients will experience some swelling and bruising around the hernia(s) such as the bellybutton, groins, or old incisions.  Ice packs or heating pads (30-60 minutes up to 6 times a day) will help. Use ice for the first few days to help decrease swelling and bruising, then switch to heat to help relax tight/sore spots and speed recovery.  Some people prefer to use ice alone, heat alone, alternating between ice & heat.  Experiment to what works for you.  Swelling and bruising can take several weeks to resolve.   °c. It is helpful to take an over-the-counter pain medication regularly for the first few weeks.  Choose one of the following that works best for you: °i. Naproxen (Aleve, etc)  Two 220mg tabs twice a day °ii. Ibuprofen (Advil, etc) Three 200mg tabs four times a day (every meal & bedtime) °iii. Acetaminophen (Tylenol, etc) 325-650mg four times a day (every meal & bedtime) °d. A  prescription for pain medication should be given to you upon discharge.  Take your pain medication as prescribed.  °i. If you are having problems/concerns with the prescription medicine (does not control pain, nausea, vomiting, rash, itching, etc), please call us [phone] to see if we need to switch you to a different pain medicine that will work better for you and/or control your side effect better. °ii. If you need a refill on your pain  medication, please contact your pharmacy.  They will contact our office to request authorization. Prescriptions will not be filled after 5 pm or on week-ends. °4. Avoid getting constipated.  Between the surgery and the pain medications, it is common to experience some constipation.  Increasing fluid intake and taking a fiber supplement (such as Metamucil, Citrucel, FiberCon, MiraLax, etc) 1-2 times a day regularly will usually help prevent this problem from occurring.  A mild laxative (prune juice, Milk of Magnesia, MiraLax, etc) should be taken according to package directions if there are no bowel movements after 48 hours.   °5. Wash / shower every day.  You may shower over the dressings as they are waterproof.   °6. Remove your waterproof bandages 5 days after surgery.  You may leave the incision open to air.  You may replace a dressing/Band-Aid to cover the incision for comfort if you wish.  Continue to shower over incision(s) after the dressing is off. ° ° ° °7. ACTIVITIES as tolerated:   °a. You may resume regular (light) daily activities beginning the next day--such as daily self-care, walking, climbing stairs--gradually increasing activities as tolerated.  If you can walk 30 minutes without difficulty, it is safe to try more intense activity such as jogging, treadmill, bicycling, low-impact aerobics, swimming, etc. °b. Save the most intensive and strenuous activity for last such as sit-ups, heavy lifting, contact sports, etc  Refrain from any heavy lifting or straining until you are off narcotics for pain control.   °  c. DO NOT PUSH THROUGH PAIN.  Let pain be your guide: If it hurts to do something, don't do it.  Pain is your body warning you to avoid that activity for another week until the pain goes down. °d. You may drive when you are no longer taking prescription pain medication, you can comfortably wear a seatbelt, and you can safely maneuver your car and apply brakes. °e. You may have sexual intercourse  when it is comfortable.  °8. FOLLOW UP in our office °a. Please call [facility] at [phone] to set up an appointment to see your surgeon in the office for a follow-up appointment approximately 2-3 weeks after your surgery. °b. Make sure that you call for this appointment the day you arrive home to insure a convenient appointment time. °9.  IF YOU HAVE DISABILITY OR FAMILY LEAVE FORMS, BRING THEM TO THE OFFICE FOR PROCESSING.  DO NOT GIVE THEM TO YOUR DOCTOR. ° °WHEN TO CALL US [phone]: °1. Poor pain control °2. Reactions / problems with new medications (rash/itching, nausea, etc)  °3. Fever over 101.5 F (38.5 C) °4. Inability to urinate °5. Nausea and/or vomiting °6. Worsening swelling or bruising °7. Continued bleeding from incision. °8. Increased pain, redness, or drainage from the incision ° ° The clinic staff is available to answer your questions during regular business hours (8:30am-5pm).  Please don’t hesitate to call and ask to speak to one of our nurses for clinical concerns.  ° If you have a medical emergency, go to the nearest emergency room or call 911. ° A surgeon from [facility] is always on call at the hospitals in [place] ° °[facility] °[address], [address], [place], [place]  [address] ? ° [address] [address], [place], [place]   [address] °MAIN: [phone] ? TOLL FREE: [phone] ? FAX: [phone] °[email or web address] ° °Managing Pain ° °Pain after surgery or related to activity is often due to strain/injury to muscle, tendon, nerves and/or incisions.  This pain is usually short-term and will improve in a few months.  ° °Many people find it helpful to do the following things TOGETHER to help speed the process of healing and to get back to regular activity more quickly: ° °1. Avoid heavy physical activity at first °a. No lifting greater than 20 pounds at first, then increase to lifting as tolerated over the next few weeks °b. Do not “push  through” the pain.  Listen to your body and avoid positions and maneuvers than reproduce the pain.  Wait a few days before trying something more intense °c. Walking is okay as tolerated, but go slowly and stop when getting sore.  If you can walk 30 minutes without stopping or pain, you can try more intense activity (running, jogging, aerobics, cycling, swimming, treadmill, sex, sports, weightlifting, etc ) °d. Remember: If it hurts to do it, then don’t do it! ° °2. Take Anti-inflammatory medication °a. Choose ONE of the following over-the-counter medications: °i.            Acetaminophen 500mg tabs (Tylenol) 1-2 pills with every meal and just before bedtime (avoid if you have liver problems) °ii.            Naproxen 220mg tabs (ex. Aleve) 1-2 pills twice a day (avoid if you have kidney, stomach, IBD, or bleeding problems) °iii. Ibuprofen 200mg tabs (ex. Advil, Motrin) 3-4 pills with every meal and just before bedtime (avoid if you have kidney, stomach, IBD, or bleeding   problems) °b. Take with food/snack around the clock for 1-2 weeks °i. This helps the muscle and nerve tissues become less irritable and calm down faster ° °3. Use a Heating pad or Ice/Cold Pack °a. 4-6 times a day °b. May use warm bath/hottub  or showers ° °4. Try Gentle Massage and/or Stretching  °a. at the area of pain many times a day °b. stop if you feel pain - do not overdo it ° °Try these steps together to help you body heal faster and avoid making things get worse.  Doing just one of these things may not be enough.   ° °If you are not getting better after two weeks or are noticing you are getting worse, contact our office for further advice; we may need to re-evaluate you & see what other things we can do to help. ° °GETTING TO GOOD BOWEL HEALTH. °Irregular bowel habits such as constipation and diarrhea can lead to many problems over time.  Having one soft bowel movement a day is the most important way to prevent further problems.  The  anorectal canal is designed to handle stretching and feces to safely manage our ability to get rid of solid waste (feces, poop, stool) out of our body.  BUT, hard constipated stools can act like ripping concrete bricks and diarrhea can be a burning fire to this very sensitive area of our body, causing inflamed hemorrhoids, anal fissures, increasing risk is perirectal abscesses, abdominal pain/bloating, an making irritable bowel worse.     ° °The goal: ONE SOFT BOWEL MOVEMENT A DAY!  To have soft, regular bowel movements:  °• Drink plenty of fluids, consider 4-6 tall glasses of water a day.   °• Take plenty of fiber.  Fiber is the undigested part of plant food that passes into the colon, acting s “natures broom” to encourage bowel motility and movement.  Fiber can absorb and hold large amounts of water. This results in a larger, bulkier stool, which is soft and easier to pass. Work gradually over several weeks up to 6 servings a day of fiber (25g a day even more if needed) in the form of: °o Vegetables -- Root (potatoes, carrots, turnips), leafy green (lettuce, salad greens, celery, spinach), or cooked high residue (cabbage, broccoli, etc) °o Fruit -- Fresh (unpeeled skin & pulp), Dried (prunes, apricots, cherries, etc ),  or stewed ( applesauce)  °o Whole grain breads, pasta, etc (whole wheat)  °o Bran cereals  °• Bulking Agents -- This type of water-retaining fiber generally is easily obtained each day by one of the following:  °o Psyllium bran -- The psyllium plant is remarkable because its ground seeds can retain so much water. This product is available as Metamucil, Konsyl, Effersyllium, Per Diem Fiber, or the less expensive generic preparation in drug and health food stores. Although labeled a laxative, it really is not a laxative.  °o Methylcellulose -- This is another fiber derived from wood which also retains water. It is available as Citrucel. °o Polyethylene Glycol - and “artificial” fiber commonly called  Miralax or Glycolax.  It is helpful for people with gassy or bloated feelings with regular fiber °o Flax Seed - a less gassy fiber than psyllium °• No reading or other relaxing activity while on the toilet. If bowel movements take longer than 5 minutes, you are too constipated °• AVOID CONSTIPATION.  High fiber and water intake usually takes care of this.  Sometimes a laxative is needed to stimulate more frequent   bowel movements, but   Laxatives are not a good long-term solution as it can wear the colon out.  They can help jump-start bowels if constipated, but should be relied on constantly without discussing with your doctor o Osmotics (Milk of Magnesia, Fleets phosphosoda, Magnesium citrate, MiraLax, GoLytely) are safer than  o Stimulants (Senokot, Castor Oil, Dulcolax, Ex Lax)    o Avoid taking laxatives for more than 7 days in a row.   IF SEVERELY CONSTIPATED, try a Bowel Retraining Program: o Do not use laxatives.  o Eat a diet high in roughage, such as bran cereals and leafy vegetables.  o Drink six (6) ounces of prune or apricot juice each morning.  o Eat two (2) large servings of stewed fruit each day.  o Take one (1) heaping tablespoon of a psyllium-based bulking agent twice a day. Use sugar-free sweetener when possible to avoid excessive calories.  o Eat a normal breakfast.  o Set aside 15 minutes after breakfast to sit on the toilet, but do not strain to have a bowel movement.  o If you do not have a bowel movement by the third day, use an enema and repeat the above steps.   Controlling diarrhea o Switch to liquids and simpler foods for a few days to avoid stressing your intestines further. o Avoid dairy products (especially milk & ice cream) for a short time.  The intestines often can lose the ability to digest lactose when stressed. o Avoid foods that cause gassiness or bloating.  Typical foods include beans and other legumes, cabbage, broccoli, and dairy foods.  Every person has  some sensitivity to other foods, so listen to our body and avoid those foods that trigger problems for you. o Adding fiber (Citrucel, Metamucil, psyllium, Miralax) gradually can help thicken stools by absorbing excess fluid and retrain the intestines to act more normally.  Slowly increase the dose over a few weeks.  Too much fiber too soon can backfire and cause cramping & bloating. o Probiotics (such as active yogurt, Align, etc) may help repopulate the intestines and colon with normal bacteria and calm down a sensitive digestive tract.  Most studies show it to be of mild help, though, and such products can be costly. o Medicines: - Bismuth subsalicylate (ex. Kayopectate, Pepto Bismol) every 30 minutes for up to 6 doses can help control diarrhea.  Avoid if pregnant. - Loperamide (Immodium) can slow down diarrhea.  Start with two tablets (4mg  total) first and then try one tablet every 6 hours.  Avoid if you are having fevers or severe pain.  If you are not better or start feeling worse, stop all medicines and call your doctor for advice o Call your doctor if you are getting worse or not better.  Sometimes further testing (cultures, endoscopy, X-ray studies, bloodwork, etc) may be needed to help diagnose and treat the cause of the diarrhea.  TROUBLESHOOTING IRREGULAR BOWELS 1) Avoid extremes of bowel movements (no bad constipation/diarrhea) 2) Miralax 17gm mixed in 8oz. water or juice-daily. May use BID as needed.  3) Gas-x,Phazyme, etc. as needed for gas & bloating.  4) Soft,bland diet. No spicy,greasy,fried foods.  5) Prilosec over-the-counter as needed  6) May hold gluten/wheat products from diet to see if symptoms improve.  7)  May try probiotics (Align, Activa, etc) to help calm the bowels down 7) If symptoms become worse call back immediately.  Inguinal Hernia, Adult Muscles help keep everything in the body in its proper place. But if  a weak spot in the muscles develops, something can poke  through. That is called a hernia. When this happens in the lower part of the belly (abdomen), it is called an inguinal hernia. (It takes its name from a part of the body in this region called the inguinal canal.) A weak spot in the wall of muscles lets some fat or part of the small intestine bulge through. An inguinal hernia can develop at any age. Men get them more often than women. CAUSES  In adults, an inguinal hernia develops over time.  It can be triggered by:  Suddenly straining the muscles of the lower abdomen.  Lifting heavy objects.  Straining to have a bowel movement. Difficult bowel movements (constipation) can lead to this.  Constant coughing. This may be caused by smoking or lung disease.  Being overweight.  Being pregnant.  Working at a job that requires long periods of standing or heavy lifting.  Having had an inguinal hernia before. One type can be an emergency situation. It is called a strangulated inguinal hernia. It develops if part of the small intestine slips through the weak spot and cannot get back into the abdomen. The blood supply can be cut off. If that happens, part of the intestine may die. This situation requires emergency surgery. SYMPTOMS  Often, a small inguinal hernia has no symptoms. It is found when a healthcare provider does a physical exam. Larger hernias usually have symptoms.   In adults, symptoms may include:  A lump in the groin. This is easier to see when the person is standing. It might disappear when lying down.  In men, a lump in the scrotum.  Pain or burning in the groin. This occurs especially when lifting, straining or coughing.  A dull ache or feeling of pressure in the groin.  Signs of a strangulated hernia can include:  A bulge in the groin that becomes very painful and tender to the touch.  A bulge that turns red or purple.  Fever, nausea and vomiting.  Inability to have a bowel movement or to pass gas. DIAGNOSIS  To  decide if you have an inguinal hernia, a healthcare provider will probably do a physical examination.  This will include asking questions about any symptoms you have noticed.  The healthcare provider might feel the groin area and ask you to cough. If an inguinal hernia is felt, the healthcare provider may try to slide it back into the abdomen.  Usually no other tests are needed. TREATMENT  Treatments can vary. The size of the hernia makes a difference. Options include:  Watchful waiting. This is often suggested if the hernia is small and you have had no symptoms.  No medical procedure will be done unless symptoms develop.  You will need to watch closely for symptoms. If any occur, contact your healthcare provider right away.  Surgery. This is used if the hernia is larger or you have symptoms.  Open surgery. This is usually an outpatient procedure (you will not stay overnight in a hospital). An cut (incision) is made through the skin in the groin. The hernia is put back inside the abdomen. The weak area in the muscles is then repaired by herniorrhaphy or hernioplasty. Herniorrhaphy: in this type of surgery, the weak muscles are sewn back together. Hernioplasty: a patch or mesh is used to close the weak area in the abdominal wall.  Laparoscopy. In this procedure, a surgeon makes small incisions. A thin tube with a tiny video  camera (called a laparoscope) is put into the abdomen. The surgeon repairs the hernia with mesh by looking with the video camera and using two long instruments. HOME CARE INSTRUCTIONS   After surgery to repair an inguinal hernia:  You will need to take pain medicine prescribed by your healthcare provider. Follow all directions carefully.  You will need to take care of the wound from the incision.  Your activity will be restricted for awhile. This will probably include no heavy lifting for several weeks. You also should not do anything too active for a few weeks. When  you can return to work will depend on the type of job that you have.  During "watchful waiting" periods, you should:  Maintain a healthy weight.  Eat a diet high in fiber (fruits, vegetables and whole grains).  Drink plenty of fluids to avoid constipation. This means drinking enough water and other liquids to keep your urine clear or pale yellow.  Do not lift heavy objects.  Do not stand for long periods of time.  Quit smoking. This should keep you from developing a frequent cough. SEEK MEDICAL CARE IF:   A bulge develops in your groin area.  You feel pain, a burning sensation or pressure in the groin. This might be worse if you are lifting or straining.  You develop a fever of more than 100.5 F (38.1 C). SEEK IMMEDIATE MEDICAL CARE IF:   Pain in the groin increases suddenly.  A bulge in the groin gets bigger suddenly and does not go down.  For men, there is sudden pain in the scrotum. Or, the size of the scrotum increases.  A bulge in the groin area becomes red or purple and is painful to touch.  You have nausea or vomiting that does not go away.  You feel your heart beating much faster than normal.  You cannot have a bowel movement or pass gas.  You develop a fever of more than 102.0 F (38.9 C).   This information is not intended to replace advice given to you by your health care provider. Make sure you discuss any questions you have with your health care provider.   Document Released: 06/21/2008 Document Revised: 04/27/2011 Document Reviewed: 08/06/2014 Elsevier Interactive Patient Education [employer].  .Hernia, Adult A hernia is the bulging of an organ or tissue through a weak spot in the muscles of the abdomen (abdominal wall). Hernias develop most often near the navel or groin. There are many kinds of hernias. Common kinds include:  Femoral hernia. This kind of hernia develops under the groin in the upper thigh area.  Inguinal hernia. This kind of  hernia develops in the groin or scrotum.  Umbilical hernia. This kind of hernia develops near the navel.  Hiatal hernia. This kind of hernia causes part of the stomach to be pushed up into the chest.  Incisional hernia. This kind of hernia bulges through a scar from an abdominal surgery. CAUSES This condition may be caused by:  Heavy lifting.  Coughing over a long period of time.  Straining to have a bowel movement.  An incision made during an abdominal surgery.  A birth defect (congenital defect).  Excess weight or obesity.  Smoking.  Poor nutrition.  Cystic fibrosis.  Excess fluid in the abdomen.  Undescended testicles. SYMPTOMS Symptoms of a hernia include:  A lump on the abdomen. This is the first sign of a hernia. The lump may become more obvious with standing, straining, or coughing.  It may get bigger over time if it is not treated or if the condition causing it is not treated.  Pain. A hernia is usually painless, but it may become painful over time if treatment is delayed. The pain is usually dull and may get worse with standing or lifting heavy objects. Sometimes a hernia gets tightly squeezed in the weak spot (strangulated) or stuck there (incarcerated) and causes additional symptoms. These symptoms may include:  Vomiting.  Nausea.  Constipation.  Irritability. DIAGNOSIS A hernia may be diagnosed with:  A physical exam. During the exam your health care provider may ask you to cough or to make a specific movement, because a hernia is usually more visible when you move.  Imaging tests. These can include:  X-rays.  Ultrasound.  CT scan. TREATMENT A hernia that is small and painless may not need to be treated. A hernia that is large or painful may be treated with surgery. Inguinal hernias may be treated with surgery to prevent incarceration or strangulation. Strangulated hernias are always treated with surgery, because lack of blood to the trapped organ  or tissue can cause it to die. Surgery to treat a hernia involves pushing the bulge back into place and repairing the weak part of the abdomen. HOME CARE INSTRUCTIONS  Avoid straining.  Do not lift anything heavier than 10 lb (4.5 kg).  Lift with your leg muscles, not your back muscles. This helps avoid strain.  When coughing, try to cough gently.  Prevent constipation. Constipation leads to straining with bowel movements, which can make a hernia worse or cause a hernia repair to break down. You can prevent constipation by:  Eating a high-fiber diet that includes plenty of fruits and vegetables.  Drinking enough fluids to keep your urine clear or pale yellow. Aim to drink 6-8 glasses of water per day.  Using a stool softener as directed by your health care provider.  Lose weight, if you are overweight.  Do not use any tobacco products, including cigarettes, chewing tobacco, or electronic cigarettes. If you need help quitting, ask your health care provider.  Keep all follow-up visits as directed by your health care provider. This is important. Your health care provider may need to monitor your condition. SEEK MEDICAL CARE IF:  You have swelling, redness, and pain in the affected area.  Your bowel habits change. SEEK IMMEDIATE MEDICAL CARE IF:  You have a fever.  You have abdominal pain that is getting worse.  You feel nauseous or you vomit.  You cannot push the hernia back in place by gently pressing on it while you are lying down.  The hernia:  Changes in shape or size.  Is stuck outside the abdomen.  Becomes discolored.  Feels hard or tender.   This information is not intended to replace advice given to you by your health care provider. Make sure you discuss any questions you have with your health care provider.   Document Released: 02/02/2005 Document Revised: 02/23/2014 Document Reviewed: 12/13/2013 Elsevier Interactive Patient Education 2016 [facility], Adult, Care After Refer to this sheet in the next few weeks. These instructions provide you with information on caring for yourself after your procedure. Your health care provider may also give you more specific instructions. Your treatment has been planned according to current medical practices, but problems sometimes occur. Call your health care provider if you have any problems or questions after your procedure. WHAT TO EXPECT AFTER THE PROCEDURE After the procedure, it  is typical to experience:  Sleepiness.  Nausea and vomiting. HOME CARE INSTRUCTIONS  For the first 24 hours after general anesthesia:  Have a responsible person with you.  Do not drive a car. If you are alone, do not take public transportation.  Do not drink alcohol.  Do not take medicine that has not been prescribed by your health care provider.  Do not sign important papers or make important decisions.  You may resume a normal diet and activities as directed by your health care provider.  Change bandages (dressings) as directed.  If you have questions or problems that seem related to general anesthesia, call the hospital and ask for the anesthetist or anesthesiologist on call. SEEK MEDICAL CARE IF:  You have nausea and vomiting that continue the day after anesthesia.  You develop a rash. SEEK IMMEDIATE MEDICAL CARE IF:   You have difficulty breathing.  You have chest pain.  You have any allergic problems.   This information is not intended to replace advice given to you by your health care provider. Make sure you discuss any questions you have with your health care provider.   Document Released: 05/11/2000 Document Revised: 02/23/2014 Document Reviewed: 06/03/2011 Elsevier Interactive Patient Education [employer].

## 2015-04-16 NOTE — Anesthesia Postprocedure Evaluation (Signed)
Anesthesia Post Note  Patient: Larry Randall  Procedure(s) Performed: Procedure(s) (LRB): LAPAROSCOPIC BILATERAL INGUINAL HERNIA REPAIR (Bilateral) LAPAROSCOPIC UMBILICAL HERNIA (N/A) INSERTION OF MESH (Bilateral)  Patient location during evaluation: PACU Anesthesia Type: General Level of consciousness: awake and alert, oriented and patient cooperative Pain management: pain level controlled Vital Signs Assessment: post-procedure vital signs reviewed and stable Respiratory status: spontaneous breathing, nonlabored ventilation, respiratory function stable and patient connected to nasal cannula oxygen Cardiovascular status: blood pressure returned to baseline and stable Postop Assessment: no signs of nausea or vomiting Anesthetic complications: no    Last Vitals:  Filed Vitals:   04/16/15 1230 04/16/15 1242  BP: 100/74 107/72  Pulse: 86 89  Temp:  37.1 C  Resp: 13 14    Last Pain:  Filed Vitals:   04/16/15 1244  PainSc: 0-No pain                 Starleen Trussell,E. Daija Routson

## 2015-04-17 ENCOUNTER — Encounter (HOSPITAL_COMMUNITY): Payer: Self-pay | Admitting: Surgery

## 2015-05-03 DIAGNOSIS — R5383 Other fatigue: Secondary | ICD-10-CM | POA: Diagnosis not present

## 2015-05-03 DIAGNOSIS — J069 Acute upper respiratory infection, unspecified: Secondary | ICD-10-CM | POA: Diagnosis not present

## 2015-05-03 DIAGNOSIS — R05 Cough: Secondary | ICD-10-CM | POA: Diagnosis not present

## 2015-05-15 DIAGNOSIS — M25612 Stiffness of left shoulder, not elsewhere classified: Secondary | ICD-10-CM | POA: Diagnosis not present

## 2015-06-25 ENCOUNTER — Telehealth: Payer: Self-pay | Admitting: Physical Medicine & Rehabilitation

## 2015-06-26 ENCOUNTER — Other Ambulatory Visit: Payer: Self-pay | Admitting: Physical Medicine & Rehabilitation

## 2015-06-27 DIAGNOSIS — I10 Essential (primary) hypertension: Secondary | ICD-10-CM | POA: Diagnosis not present

## 2015-06-27 DIAGNOSIS — M25572 Pain in left ankle and joints of left foot: Secondary | ICD-10-CM | POA: Diagnosis not present

## 2015-06-27 DIAGNOSIS — E784 Other hyperlipidemia: Secondary | ICD-10-CM | POA: Diagnosis not present

## 2015-06-27 DIAGNOSIS — Z79899 Other long term (current) drug therapy: Secondary | ICD-10-CM | POA: Diagnosis not present

## 2015-06-27 DIAGNOSIS — Z719 Counseling, unspecified: Secondary | ICD-10-CM | POA: Diagnosis not present

## 2015-06-27 DIAGNOSIS — M25512 Pain in left shoulder: Secondary | ICD-10-CM | POA: Diagnosis not present

## 2015-06-27 DIAGNOSIS — E039 Hypothyroidism, unspecified: Secondary | ICD-10-CM | POA: Diagnosis not present

## 2015-07-04 DIAGNOSIS — M25612 Stiffness of left shoulder, not elsewhere classified: Secondary | ICD-10-CM | POA: Diagnosis not present

## 2015-07-05 NOTE — Telephone Encounter (Signed)
Please see Dr. Letta Pate message. He will not write a letter since he has not seen the patient in a year. He suggests the patient call Dr. Doran Durand. Please relay this information to the patient. Thank you.

## 2015-07-05 NOTE — Telephone Encounter (Signed)
I have not seen the patient for a year. He should get the letter from Dr. Doran Durand

## 2015-07-05 NOTE — Telephone Encounter (Signed)
Called and left a message with the patient to call back

## 2015-07-09 DIAGNOSIS — M25512 Pain in left shoulder: Secondary | ICD-10-CM | POA: Diagnosis not present

## 2015-07-09 DIAGNOSIS — M25612 Stiffness of left shoulder, not elsewhere classified: Secondary | ICD-10-CM | POA: Diagnosis not present

## 2015-07-10 DIAGNOSIS — M5412 Radiculopathy, cervical region: Secondary | ICD-10-CM | POA: Diagnosis not present

## 2015-07-10 DIAGNOSIS — M25612 Stiffness of left shoulder, not elsewhere classified: Secondary | ICD-10-CM | POA: Diagnosis not present

## 2015-07-10 DIAGNOSIS — M8700 Idiopathic aseptic necrosis of unspecified bone: Secondary | ICD-10-CM | POA: Diagnosis not present

## 2015-07-18 ENCOUNTER — Telehealth: Payer: Self-pay | Admitting: Cardiology

## 2015-07-18 NOTE — Telephone Encounter (Signed)
Received records from [facility] for appointment with Dr Ellyn Hack on 07/26/15.  Records given to [facility] (medical records) for Dr Allison Quarry schedule on 07/26/15. lp

## 2015-07-19 ENCOUNTER — Encounter: Payer: Worker's Compensation | Attending: Physical Medicine & Rehabilitation

## 2015-07-19 ENCOUNTER — Ambulatory Visit (HOSPITAL_BASED_OUTPATIENT_CLINIC_OR_DEPARTMENT_OTHER): Payer: PPO | Admitting: Physical Medicine & Rehabilitation

## 2015-07-19 ENCOUNTER — Telehealth: Payer: Self-pay | Admitting: Cardiology

## 2015-07-19 ENCOUNTER — Encounter: Payer: Self-pay | Admitting: Physical Medicine & Rehabilitation

## 2015-07-19 VITALS — BP 150/90 | HR 89 | Resp 16

## 2015-07-19 DIAGNOSIS — S82872A Displaced pilon fracture of left tibia, initial encounter for closed fracture: Secondary | ICD-10-CM | POA: Diagnosis not present

## 2015-07-19 DIAGNOSIS — G8921 Chronic pain due to trauma: Secondary | ICD-10-CM | POA: Diagnosis not present

## 2015-07-19 DIAGNOSIS — M19172 Post-traumatic osteoarthritis, left ankle and foot: Secondary | ICD-10-CM

## 2015-07-19 DIAGNOSIS — Z87891 Personal history of nicotine dependence: Secondary | ICD-10-CM | POA: Diagnosis not present

## 2015-07-19 DIAGNOSIS — I2510 Atherosclerotic heart disease of native coronary artery without angina pectoris: Secondary | ICD-10-CM | POA: Diagnosis not present

## 2015-07-19 DIAGNOSIS — K219 Gastro-esophageal reflux disease without esophagitis: Secondary | ICD-10-CM | POA: Insufficient documentation

## 2015-07-19 DIAGNOSIS — M12572 Traumatic arthropathy, left ankle and foot: Secondary | ICD-10-CM | POA: Insufficient documentation

## 2015-07-19 DIAGNOSIS — Z87442 Personal history of urinary calculi: Secondary | ICD-10-CM | POA: Diagnosis not present

## 2015-07-19 DIAGNOSIS — I10 Essential (primary) hypertension: Secondary | ICD-10-CM | POA: Diagnosis not present

## 2015-07-19 DIAGNOSIS — S92002A Unspecified fracture of left calcaneus, initial encounter for closed fracture: Secondary | ICD-10-CM | POA: Insufficient documentation

## 2015-07-19 DIAGNOSIS — I509 Heart failure, unspecified: Secondary | ICD-10-CM | POA: Diagnosis not present

## 2015-07-19 DIAGNOSIS — E785 Hyperlipidemia, unspecified: Secondary | ICD-10-CM | POA: Diagnosis not present

## 2015-07-19 DIAGNOSIS — F329 Major depressive disorder, single episode, unspecified: Secondary | ICD-10-CM | POA: Diagnosis not present

## 2015-07-19 DIAGNOSIS — G90523 Complex regional pain syndrome I of lower limb, bilateral: Secondary | ICD-10-CM

## 2015-07-19 MED ORDER — GABAPENTIN 600 MG PO TABS: ORAL_TABLET | ORAL | Status: DC

## 2015-07-19 NOTE — Telephone Encounter (Signed)
New Message   Pt calling to change appt on 6/9- conflict with schedule. Pt only wants to see Dr. Ellyn Hack, but no availability for New Pt. Pease call back to discuss

## 2015-07-19 NOTE — Telephone Encounter (Signed)
PATIENT SPOKE TO SCHEDULER -APPT SCHEDULE 07/23/15 AT 4 PM FOR NEW EVAL.
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
## 2015-07-19 NOTE — Patient Instructions (Signed)
Will make a referral to physical therapy for the FCE. I will review this and based on This I can write permanent restrictions  You have about a 6 month supply of gabapentin. After this you may follow up with her primary physician to get this. Will not need to see you after I review the FCE.

## 2015-07-19 NOTE — Telephone Encounter (Signed)
NO ANSWER - UNABLE TO LEAVE MESSAGE

## 2015-07-23 ENCOUNTER — Ambulatory Visit (INDEPENDENT_AMBULATORY_CARE_PROVIDER_SITE_OTHER): Payer: PPO | Admitting: Cardiology

## 2015-07-23 ENCOUNTER — Encounter: Payer: Self-pay | Admitting: Cardiology

## 2015-07-23 VITALS — BP 136/82 | HR 74 | Ht 71.0 in | Wt 199.5 lb

## 2015-07-23 DIAGNOSIS — I2510 Atherosclerotic heart disease of native coronary artery without angina pectoris: Secondary | ICD-10-CM | POA: Diagnosis not present

## 2015-07-23 DIAGNOSIS — E785 Hyperlipidemia, unspecified: Secondary | ICD-10-CM | POA: Diagnosis not present

## 2015-07-23 DIAGNOSIS — I252 Old myocardial infarction: Secondary | ICD-10-CM

## 2015-07-23 DIAGNOSIS — I10 Essential (primary) hypertension: Secondary | ICD-10-CM | POA: Diagnosis not present

## 2015-07-23 NOTE — Patient Instructions (Signed)
Your physician has requested that you have an echocardiogram at [facility]. Echocardiography is a painless test that uses sound waves to create images of your heart. It provides your doctor with information about the size and shape of your heart and how well your heart's chambers and valves are working. This procedure takes approximately one hour. There are no restrictions for this procedure.  NO CHANGES WITH CURRENT MEDICATIONS   Your physician wants you to follow-up in [place]. You will receive a reminder letter in the mail two months in advance. If you don't receive a letter, please call our office to schedule the follow-up appointment.

## 2015-07-23 NOTE — Progress Notes (Signed)
PCP: Imagene Riches, NP  Clinic Note: Chief Complaint  Patient presents with  . New Evaluation    Referred by Heide Scales, NP to establish care  pt states he has a hiatal hernia--occasional CP with exercise and some pressure, not sure if from hernia; occasional dizziness when he stands up  . Coronary Artery Disease    History of MI, and PCI    HPI: Larry Randall is a 65 y.o. male with a PMH below who presents today for establishing Cardiology Care - h/o CAD-MI [phone] (PCI x 2 for AMI followed by 2/d PCI x1 for abnormal ST shortly after MI). MI complicated by VF arrest - intubated / ICU & PNA. Last saw cardiologist 19 yrs ago.He has remained active exercising at least 30 minutes a day 5 days a week.  Recent Hospitalizations: for surgeries - was told by anesthesiologist to see a cardiologist   Studies Reviewed: no recent studies.  Interval History: [name] presents today to basically reestablish cardiology care at the urging of his chronic pain management physician. He has a distant history of MI back in the 90s that was a very collocated experience. However shortly after that he proceed to move on and did not follow-up with cardiology.  He says he occasionally has some pressure with exercising, but it is usually lower in the chest. Is not associated with dyspnea. Is not like his prior anginal symptoms. He is not having any sensation of rapid irregular heartbeats or palpitations. He does have some positional dizziness. No heart failure symptoms of PND, orthopnea or edema.  No palpitations, lightheadedness, dizziness, weakness or syncope/near syncope. No TIA/amaurosis fugax symptoms. No melena, hematochezia, hematuria, or epstaxis. No claudication.  ROS: A comprehensive was performed. Review of Systems  Constitutional: Negative for fever, chills and malaise/fatigue.  HENT: Negative for nosebleeds.   Respiratory: Negative for cough, shortness of breath and wheezing.     Cardiovascular: Positive for chest pain (More epigastric).  Gastrointestinal: Positive for abdominal pain (From his hernia). Negative for constipation, blood in stool and melena.  Genitourinary: Negative for frequency, hematuria and flank pain.  Musculoskeletal: Positive for back pain and joint pain. Negative for myalgias and falls.  Neurological: Negative for dizziness, loss of consciousness and headaches.  Endo/Heme/Allergies: Does not bruise/bleed easily.  Psychiatric/Behavioral: Negative for depression and memory loss. The patient is not nervous/anxious and does not have insomnia.   All other systems reviewed and are negative.   Past Medical History  Diagnosis Date  . Anxiety   . GERD (gastroesophageal reflux disease)   . Hyperlipemia   . History of MI (myocardial infarction) 1996--      S/P ANGIOPLASTY X3 STENTS  . S/P primary angioplasty with coronary stent X3 STENTS  1996  POST MI    PER PT BARE METAL STENTS  . Coronary artery disease involving native coronary artery without angina pectoris     LAST FOLLOW UP WITH CARDIOLOGIST WAS LATE 1990's  . History of CHF (congestive heart failure)   . History of gastric ulcer   . Atypical rash OF MOUTH -- PER PT UNKNOWN ETIOLOGY    RX MOUTHWASH BID-  PT STATES POSSIBLE FROM GERD  . History of kidney stones   . Depression   . History of colon polyps     2013  . BPH (benign prostatic hypertrophy)   . History of urinary retention   . 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 Sigmoid diverticulosis   . Bladder mass     BLADDER NECK AREA  . Arthritis     LEFT LEG AND ANKLE  . At risk for sleep apnea     STOP-BANG=  6   SENT TO PCP 08-28-2013  . Hypertension   . Thyroid disease   . Borderline diabetes   . H/O hiatal hernia     Past Surgical History  Procedure Laterality Date  . Tonsillectomy and adenoidectomy  CHILD  . Extracorporeal shock wave lithotripsy  05-02-2009  . Transurethral  resection of prostate  X2   YRS AGO    BEFORE 1996  . Coronary angioplasty with stent placement  1996    PER PT X3 STENTS (BARE METAL)  . Transurethral resection of prostate  09/07/2011    Procedure: TRANSURETHRAL RESECTION OF THE PROSTATE WITH GYRUS INSTRUMENTS;  Surgeon: Franchot Gallo, MD;  Location: [facility];  Service: Urology;  Laterality: N/A;  . External fixation leg  11/09/2011    Procedure: EXTERNAL FIXATION LEG;  Surgeon: Wylene Simmer, MD;  Location: [place];  Service: Orthopedics;  Laterality: Left;  . Orif calcaneous fracture  11/12/2011    Procedure: OPEN REDUCTION INTERNAL FIXATION (ORIF) CALCANEOUS FRACTURE;  Surgeon: Wylene Simmer, MD;  Location: [place];  Service: Orthopedics;  Laterality: Right;  ORIF RIGHT CALCANEOUS FRACTURE  . Orif tibia fracture  11/17/2011    Procedure: OPEN REDUCTION INTERNAL FIXATION (ORIF) TIBIA FRACTURE;  Surgeon: Wylene Simmer, MD;  Location: [place];  Service: Orthopedics;  Laterality: Left;  REMOVAL OF EXFIX, ORIF LEFT TIBIAL PILON FRACTURE  . Colonoscopy with esophagogastroduodenoscopy (egd)  10-29-2011  . Transurethral resection of bladder tumor with gyrus (turbt-gyrus) N/A 08/31/2013    Procedure: TRANSURETHRAL RESECTION OF BLADDER TUMOR WITH GYRUS;  Surgeon: Jorja Loa, MD;  Location[facility];  Service: Urology;  Laterality: N/A;  . Cystoscopy w/ retrogrades N/A 08/31/2013    Procedure: CYSTOSCOPY WITH bilateral RETROGRADE PYELOGRAM;  Surgeon: Jorja Loa, MD;  Location: [facility];  Service: Urology;  Laterality: N/A;  . Transurethral resection of prostate N/A 08/31/2013    Procedure: TRANSURETHRAL RESECTION OF THE PROSTATE WITH GYRUS ;  Surgeon: Jorja Loa, MD;  Location: [facility];  Service: Urology;  Laterality: N/A;  . Inguinal hernia repair Bilateral 04/16/2015    Procedure: LAPAROSCOPIC BILATERAL INGUINAL HERNIA REPAIR;  Surgeon: Michael Boston, MD;  Location: [facility];  Service: General;  Laterality: Bilateral;  . Umbilical hernia repair N/A 04/16/2015    Procedure: LAPAROSCOPIC UMBILICAL HERNIA;  Surgeon: Michael Boston, MD;  Location: [facility];  Service: General;  Laterality: N/A;  . Insertion of mesh Bilateral 04/16/2015    Procedure: INSERTION OF MESH;  Surgeon: Michael Boston, MD;  Location: [facility];  Service: General;  Laterality: Bilateral;    Prior to Admission medications   Medication Sig Start Date End Date Taking? Authorizing Provider  citalopram (CELEXA) 40 MG tablet Take 1 tablet by mouth daily. 07/22/15  Yes Historical Provider, MD  fluticasone (FLONASE) 50 MCG/ACT nasal spray Place 2 sprays into both nostrils daily.  04/16/12  Yes Historical Provider, MD  gabapentin (NEURONTIN) 600 MG tablet TAKE 1 TABLET (600 MG TOTAL) BY MOUTH 4 (FOUR) TIMES DAILY. 07/19/15  Yes Charlett Blake, MD  hydrochlorothiazide (HYDRODIURIL) 25 MG tablet Take 25 mg by mouth daily.   Yes Historical Provider, MD  losartan (COZAAR) 100 MG tablet Take 50 mg by mouth every morning.  Yes Historical Provider, MD  methocarbamol (ROBAXIN) 750 MG tablet Take 1 tablet (750 mg total) by mouth 4 (four) times daily as needed (use for muscle cramps/pain). 04/16/15  Yes Michael Boston, MD  pantoprazole (PROTONIX) 40 MG tablet Take 40 mg by mouth daily.   Yes Historical Provider, MD  rosuvastatin (CRESTOR) 10 MG tablet Take 10 mg by mouth at bedtime.   Yes Historical Provider, MD  thyroid (ARMOUR) 60 MG tablet Take 60 mg by mouth daily before breakfast.    Yes Historical Provider, MD   No Known Allergies   Social History   Social History  . Marital Status: Divorced    Spouse Name: N/A  . Number of Children: 3  . Years of Education: N/A   Social History Main Topics  . Smoking status: Former Smoker -- 1.00 packs/day for 30 years    Types: Cigarettes    Quit date: 09/02/1994  . Smokeless tobacco: Never Used  . Alcohol Use: No  . Drug Use: No  . Sexual Activity: Not Asked   Other  Topics Concern  . None   Social History Narrative   Divorced father of 70, grandfather of 62. He lives alone. 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 Currently now on disability.   Quit smoking 29 years ago after his MI. No alcohol.   He exercises 5 times a week walking 30 minutes a day.   Family History  Problem Relation Age of Onset  . Heart disease Father   . Colon cancer Neg Hx   . Esophageal cancer Neg Hx   . Rectal cancer Neg Hx   . Stomach cancer Neg Hx     Wt Readings from Last 3 Encounters:  07/23/15 199 lb 8 oz (90.493 kg)  04/16/15 211 lb (95.709 kg)  04/11/15 211 lb (95.709 kg)  - intentional   PHYSICAL EXAM BP 136/82 mmHg  Pulse 74  Ht 5\' 11"  (1.803 m)  Wt 199 lb 8 oz (90.493 kg)  BMI 27.84 kg/m2 General appearance: alert, cooperative, appears stated age, no distress and mild truncal obesity Neck: no adenopathy, no carotid bruit and no JVD Lungs: clear to auscultation bilaterally, normal percussion bilaterally and non-labored Heart: regular rate and rhythm, S1, S2 normal, no murmur, click, rub or gallop; non-displaced PMI Abdomen: soft, non-tender; bowel sounds normal; no masses,  no organomegaly; no HJR Extremities: extremities normal, atraumatic, no cyanosis, and edema  -- 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  Narrative Interpretation: No severe fatty change from last EKG recorder. Relatively normal EKG.   Other studies Reviewed: Additional studies/ records that were reviewed today include:  Recent Labs:   - levels checked by PCP w/in 1 month. No results found for: CHOL, HDL, LDLCALC,  LDLDIRECT, TRIG, CHOLHDL    ASSESSMENT / PLAN: Problem List Items Addressed This Visit    Hx of myocardial infarction    No history of location of MI, any further cardiac evaluation. Activity cath reports etc. I would like to have at least a structural assessment of the heart with a 2-D echocardiogram. If he has symptoms more concerning and would check stress test as well.  Today he doesn't have any symptoms.      Relevant Orders   EKG 12-Lead (Completed)   ECHOCARDIOGRAM COMPLETE   Essential hypertension (Chronic)    Well-controlled on current meds. Should be able to tolerate beta blocker if we chose to add 1.      Dyslipidemia, goal LDL below 70 (Chronic)    On statin. I don't have recent labs. Monitored by PCP.      Coronary artery disease involving native coronary artery of native heart without angina pectoris - Primary (Chronic)    Not really having much the way of any anginal symptoms. The chest discomfort he is describing sounds more like it's epigastric. I would like for him to be on a beta blocker - we'll need to reassess his blood pressure at his follow-up visit. He is on ARB and statin. He says he takes a baby aspirin.  He has not had an evaluation in many years. In the absence of true anginal symptoms, I don't think we need to do a stress test, but would be reasonable to check an echocardiogram to get a baseline cardiac function assessment.       Relevant Orders   EKG 12-Lead (Completed)   ECHOCARDIOGRAM COMPLETE      Current medicines are reviewed at length with the patient today. (+/- concerns) nlne The following changes have been made: n/a   Studies Ordered:   Orders Placed This Encounter  Procedures  . EKG 12-Lead  . ECHOCARDIOGRAM COMPLETE    ROV: 6 MONTHS WITH DR Ellyn Hack   Glenetta Hew, M.D., M.S. Interventional Cardiologist   Pager # [phone] Phone # [phone] [address] [place], [place] [address]

## 2015-07-24 ENCOUNTER — Telehealth: Payer: Self-pay | Admitting: Cardiology

## 2015-07-24 NOTE — Telephone Encounter (Signed)
Called the patient and left a voicemail to call me back and schedule his echo.

## 2015-07-25 ENCOUNTER — Encounter: Payer: Self-pay | Admitting: Cardiology

## 2015-07-25 DIAGNOSIS — I252 Old myocardial infarction: Secondary | ICD-10-CM

## 2015-07-25 DIAGNOSIS — Z01810 Encounter for preprocedural cardiovascular examination: Secondary | ICD-10-CM | POA: Insufficient documentation

## 2015-07-25 DIAGNOSIS — E785 Hyperlipidemia, unspecified: Secondary | ICD-10-CM | POA: Insufficient documentation

## 2015-07-25 DIAGNOSIS — I2510 Atherosclerotic heart disease of native coronary artery without angina pectoris: Secondary | ICD-10-CM | POA: Insufficient documentation

## 2015-07-25 DIAGNOSIS — I10 Essential (primary) hypertension: Secondary | ICD-10-CM | POA: Insufficient documentation

## 2015-07-25 HISTORY — DX: Atherosclerotic heart disease of native coronary artery without angina pectoris: I25.10

## 2015-07-25 HISTORY — DX: Encounter for preprocedural cardiovascular examination: Z01.810

## 2015-07-25 HISTORY — DX: Essential (primary) hypertension: I10

## 2015-07-25 HISTORY — DX: Hyperlipidemia, unspecified: E78.5

## 2015-07-25 HISTORY — DX: Old myocardial infarction: I25.2

## 2015-07-25 NOTE — Assessment & Plan Note (Signed)
No history of location of MI, any further cardiac evaluation. Activity cath reports etc. I would like to have at least a structural assessment of the heart with a 2-D echocardiogram. If he has symptoms more concerning and would check stress test as well. Today he doesn't have any symptoms.

## 2015-07-25 NOTE — Assessment & Plan Note (Signed)
Not really having much the way of any anginal symptoms. The chest discomfort he is describing sounds more like it's epigastric. I would like for him to be on a beta blocker - we'll need to reassess his blood pressure at his follow-up visit. He is on ARB and statin. He says he takes a baby aspirin.  He has not had an evaluation in many years. In the absence of true anginal symptoms, I don't think we need to do a stress test, but would be reasonable to check an echocardiogram to get a baseline cardiac function assessment.

## 2015-07-25 NOTE — Assessment & Plan Note (Signed)
On statin. I don't have recent labs. Monitored by PCP.

## 2015-07-25 NOTE — Assessment & Plan Note (Signed)
Well-controlled on current meds. Should be able to tolerate beta blocker if we chose to add 1.

## 2015-07-26 ENCOUNTER — Ambulatory Visit: Payer: Worker's Compensation | Admitting: Physical Medicine & Rehabilitation

## 2015-07-26 ENCOUNTER — Ambulatory Visit: Payer: Self-pay | Admitting: Cardiology

## 2015-07-29 ENCOUNTER — Ambulatory Visit: Payer: Worker's Compensation | Admitting: Physical Medicine & Rehabilitation

## 2015-07-29 DIAGNOSIS — S42252A Displaced fracture of greater tuberosity of left humerus, initial encounter for closed fracture: Secondary | ICD-10-CM | POA: Diagnosis not present

## 2015-07-29 DIAGNOSIS — M25612 Stiffness of left shoulder, not elsewhere classified: Secondary | ICD-10-CM | POA: Diagnosis not present

## 2015-07-29 DIAGNOSIS — M8700 Idiopathic aseptic necrosis of unspecified bone: Secondary | ICD-10-CM | POA: Diagnosis not present

## 2015-07-30 ENCOUNTER — Encounter: Payer: Self-pay | Admitting: Cardiology

## 2015-07-30 ENCOUNTER — Telehealth: Payer: Self-pay | Admitting: Cardiology

## 2015-07-30 NOTE — Telephone Encounter (Signed)
Called and left a voicemail for the patient to schedule his echocardiogram.

## 2015-08-01 DIAGNOSIS — I10 Essential (primary) hypertension: Secondary | ICD-10-CM | POA: Diagnosis not present

## 2015-08-01 DIAGNOSIS — Q386 Other congenital malformations of mouth: Secondary | ICD-10-CM | POA: Diagnosis not present

## 2015-08-01 DIAGNOSIS — Z79899 Other long term (current) drug therapy: Secondary | ICD-10-CM | POA: Diagnosis not present

## 2015-08-05 DIAGNOSIS — K4020 Bilateral inguinal hernia, without obstruction or gangrene, not specified as recurrent: Secondary | ICD-10-CM | POA: Diagnosis not present

## 2015-08-05 DIAGNOSIS — K429 Umbilical hernia without obstruction or gangrene: Secondary | ICD-10-CM | POA: Diagnosis not present

## 2015-08-05 DIAGNOSIS — R109 Unspecified abdominal pain: Secondary | ICD-10-CM | POA: Diagnosis not present

## 2015-08-12 DIAGNOSIS — M5412 Radiculopathy, cervical region: Secondary | ICD-10-CM | POA: Diagnosis not present

## 2015-08-12 DIAGNOSIS — M50222 Other cervical disc displacement at C5-C6 level: Secondary | ICD-10-CM | POA: Diagnosis not present

## 2015-08-12 DIAGNOSIS — M50221 Other cervical disc displacement at C4-C5 level: Secondary | ICD-10-CM | POA: Diagnosis not present

## 2015-08-12 DIAGNOSIS — M50223 Other cervical disc displacement at C6-C7 level: Secondary | ICD-10-CM | POA: Diagnosis not present

## 2015-08-16 ENCOUNTER — Other Ambulatory Visit (HOSPITAL_COMMUNITY): Payer: Self-pay

## 2015-08-21 DIAGNOSIS — M5412 Radiculopathy, cervical region: Secondary | ICD-10-CM | POA: Diagnosis not present

## 2015-08-23 DIAGNOSIS — M50322 Other cervical disc degeneration at C5-C6 level: Secondary | ICD-10-CM | POA: Diagnosis not present

## 2015-08-23 DIAGNOSIS — M50323 Other cervical disc degeneration at C6-C7 level: Secondary | ICD-10-CM | POA: Diagnosis not present

## 2015-08-23 DIAGNOSIS — M50122 Cervical disc disorder at C5-C6 level with radiculopathy: Secondary | ICD-10-CM | POA: Diagnosis not present

## 2015-08-23 DIAGNOSIS — M5412 Radiculopathy, cervical region: Secondary | ICD-10-CM | POA: Diagnosis not present

## 2015-09-02 ENCOUNTER — Ambulatory Visit (HOSPITAL_COMMUNITY): Payer: PPO | Attending: Cardiology

## 2015-09-02 ENCOUNTER — Other Ambulatory Visit: Payer: Self-pay

## 2015-09-02 DIAGNOSIS — E785 Hyperlipidemia, unspecified: Secondary | ICD-10-CM | POA: Insufficient documentation

## 2015-09-02 DIAGNOSIS — I358 Other nonrheumatic aortic valve disorders: Secondary | ICD-10-CM | POA: Insufficient documentation

## 2015-09-02 DIAGNOSIS — I119 Hypertensive heart disease without heart failure: Secondary | ICD-10-CM | POA: Insufficient documentation

## 2015-09-02 DIAGNOSIS — I2510 Atherosclerotic heart disease of native coronary artery without angina pectoris: Secondary | ICD-10-CM | POA: Insufficient documentation

## 2015-09-02 DIAGNOSIS — I340 Nonrheumatic mitral (valve) insufficiency: Secondary | ICD-10-CM | POA: Insufficient documentation

## 2015-09-02 DIAGNOSIS — I252 Old myocardial infarction: Secondary | ICD-10-CM | POA: Diagnosis not present

## 2015-09-09 ENCOUNTER — Emergency Department (HOSPITAL_COMMUNITY)
Admission: EM | Admit: 2015-09-09 | Discharge: 2015-09-09 | Disposition: A | Discharge status: Home / Self Care | Payer: PPO | Attending: Emergency Medicine | Admitting: Emergency Medicine

## 2015-09-09 ENCOUNTER — Encounter (HOSPITAL_COMMUNITY): Payer: Self-pay

## 2015-09-09 ENCOUNTER — Emergency Department (HOSPITAL_COMMUNITY): Payer: PPO

## 2015-09-09 DIAGNOSIS — R404 Transient alteration of awareness: Secondary | ICD-10-CM | POA: Diagnosis not present

## 2015-09-09 DIAGNOSIS — I2510 Atherosclerotic heart disease of native coronary artery without angina pectoris: Secondary | ICD-10-CM | POA: Diagnosis not present

## 2015-09-09 DIAGNOSIS — I110 Hypertensive heart disease with heart failure: Secondary | ICD-10-CM | POA: Diagnosis not present

## 2015-09-09 DIAGNOSIS — S99912A Unspecified injury of left ankle, initial encounter: Secondary | ICD-10-CM | POA: Diagnosis not present

## 2015-09-09 DIAGNOSIS — R55 Syncope and collapse: Secondary | ICD-10-CM | POA: Diagnosis not present

## 2015-09-09 DIAGNOSIS — Z7982 Long term (current) use of aspirin: Secondary | ICD-10-CM | POA: Insufficient documentation

## 2015-09-09 DIAGNOSIS — I509 Heart failure, unspecified: Secondary | ICD-10-CM | POA: Insufficient documentation

## 2015-09-09 DIAGNOSIS — Z87891 Personal history of nicotine dependence: Secondary | ICD-10-CM | POA: Insufficient documentation

## 2015-09-09 DIAGNOSIS — M25572 Pain in left ankle and joints of left foot: Secondary | ICD-10-CM | POA: Diagnosis not present

## 2015-09-09 DIAGNOSIS — I252 Old myocardial infarction: Secondary | ICD-10-CM | POA: Diagnosis not present

## 2015-09-09 LAB — CBG MONITORING, ED: GLUCOSE-CAPILLARY: 96 mg/dL (ref 65–99)

## 2015-09-09 LAB — BASIC METABOLIC PANEL
ANION GAP: 9 (ref 5–15)
BUN: 16 mg/dL (ref 6–20)
CO2: 24 mmol/L (ref 22–32)
Calcium: 9 mg/dL (ref 8.9–10.3)
Chloride: 106 mmol/L (ref 101–111)
Creatinine, Ser: 0.99 mg/dL (ref 0.61–1.24)
Glucose, Bld: 82 mg/dL (ref 65–99)
POTASSIUM: 4.3 mmol/L (ref 3.5–5.1)
SODIUM: 139 mmol/L (ref 135–145)

## 2015-09-09 LAB — URINALYSIS, ROUTINE W REFLEX MICROSCOPIC
Bilirubin Urine: NEGATIVE
Glucose, UA: NEGATIVE mg/dL
Hgb urine dipstick: NEGATIVE
KETONES UR: NEGATIVE mg/dL
LEUKOCYTES UA: NEGATIVE
NITRITE: NEGATIVE
PH: 6.5 (ref 5.0–8.0)
Protein, ur: NEGATIVE mg/dL
SPECIFIC GRAVITY, URINE: 1.01 (ref 1.005–1.030)

## 2015-09-09 LAB — CBC
HEMATOCRIT: 43.8 % (ref 39.0–52.0)
HEMOGLOBIN: 14.8 g/dL (ref 13.0–17.0)
MCH: 30 pg (ref 26.0–34.0)
MCHC: 33.8 g/dL (ref 30.0–36.0)
MCV: 88.7 fL (ref 78.0–100.0)
Platelets: 280 10*3/uL (ref 150–400)
RBC: 4.94 MIL/uL (ref 4.22–5.81)
RDW: 14.3 % (ref 11.5–15.5)
WBC: 14.5 10*3/uL — AB (ref 4.0–10.5)

## 2015-09-09 NOTE — ED Triage Notes (Signed)
Pt comes from grocery store, had syncopal episode and LOC in the store for about a minute. Pt was orthostatic. C/o nausea and dizziness. PTA received 4mg  of zofran

## 2015-09-09 NOTE — ED Provider Notes (Signed)
[facility] DEPT Provider Note   CSN: [id number] Arrival date & time: 09/09/15  1947  First Provider Contact:  None       History   Chief Complaint Chief Complaint  Patient presents with  . Loss of Consciousness    HPI Larry Randall is a 65 y.o. male.  Standing in [place] talking to a friend. Noticed he was getting light-headed and "next thing I know I was on the floor." He is unsure whether he hit his head. He has no current midline neck pain or focal weakness or numbness. Patient was getting "stressed" during the conversation with his friend and states he has had multiple previous syncopal episodes during stressful situations including the removal of a cast and during stressful situations at work. The last time he passed out was last year at therapy while he was getting physical therapy for a shoulder injury. Patient states he has hx of anxiety attacks.    The history is provided by the patient.  Loss of Consciousness   This is a recurrent problem. The current episode started 3 to 5 hours ago. The problem occurs rarely. The problem has been resolved. He lost consciousness for a period of less than one minute. Associated symptoms include congestion, headaches (chronic sinus headaches), light-headedness and nausea. Pertinent negatives include abdominal pain, bladder incontinence, bowel incontinence, chest pain, confusion, diaphoresis, fever, focal sensory loss, focal weakness, malaise/fatigue, palpitations, seizures, slurred speech, visual change, vomiting and weakness.    Past Medical History:  Diagnosis Date  . Anxiety   . Arthritis    LEFT LEG AND ANKLE  . At risk for sleep apnea    STOP-BANG=  6   SENT TO PCP 08-28-2013  . Atypical rash OF MOUTH -- PER PT UNKNOWN ETIOLOGY   RX MOUTHWASH BID-  PT STATES POSSIBLE FROM GERD  . Bladder mass    BLADDER NECK AREA  . Borderline diabetes   . BPH (benign prostatic hypertrophy)   . Coronary artery disease involving native  coronary artery without angina pectoris    LAST FOLLOW UP WITH CARDIOLOGIST WAS LATE 1990's  . Depression   . GERD (gastroesophageal reflux disease)   . H/O hiatal hernia   . History of CHF (congestive heart failure)   . History of colon polyps    2013  . History of gastric ulcer   . History of kidney stones   . History of MI (myocardial infarction) 1996--     S/P ANGIOPLASTY X3 STENTS  . History of urinary retention   . Hyperlipemia   . Hypertension   . 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 S/P primary angioplasty with coronary stent X3 STENTS  1996  POST MI   PER PT BARE METAL STENTS  . Sigmoid diverticulosis   . Thyroid disease     Patient Active Problem List   Diagnosis Date Noted  . Coronary artery disease involving native coronary artery of native heart without angina pectoris 07/25/2015  . Hx of myocardial infarction 07/25/2015  . Preoperative cardiovascular examination 07/25/2015  . Essential hypertension 07/25/2015  . Dyslipidemia, goal LDL below 70 07/25/2015  . Post-traumatic arthritis of ankle 01/29/2014  . Hypertrophy of prostate with urinary obstruction and other lower urinary tract symptoms (LUTS) 08/31/2013  . Hemorrhagic cystitis 08/14/2013  . RSD lower limb 03/29/2012  . Chronic pain due to trauma 03/29/2012    Past Surgical History:  Procedure Laterality Date  .  COLONOSCOPY WITH ESOPHAGOGASTRODUODENOSCOPY (EGD)  10-29-2011  . CORONARY ANGIOPLASTY WITH STENT PLACEMENT  1996   PER PT X3 STENTS (BARE METAL)  . CYSTOSCOPY W/ RETROGRADES N/A 08/31/2013   Procedure: CYSTOSCOPY WITH bilateral RETROGRADE PYELOGRAM;  Surgeon: Jorja Loa, MD;  Location: [facility];  Service: Urology;  Laterality: N/A;  . EXTERNAL FIXATION LEG  11/09/2011   Procedure: EXTERNAL FIXATION LEG;  Surgeon: Wylene Simmer, MD;  Location: [facility];  Service: Orthopedics;  Laterality: Left;  . EXTRACORPOREAL SHOCK WAVE  LITHOTRIPSY  05-02-2009  . HERNIA REPAIR    . INGUINAL HERNIA REPAIR Bilateral 04/16/2015   Procedure: LAPAROSCOPIC BILATERAL INGUINAL HERNIA REPAIR;  Surgeon: Michael Boston, MD;  Location: [facility];  Service: General;  Laterality: Bilateral;  . INSERTION OF MESH Bilateral 04/16/2015   Procedure: INSERTION OF MESH;  Surgeon: Michael Boston, MD;  Location: [facility];  Service: General;  Laterality: Bilateral;  . ORIF CALCANEOUS FRACTURE  11/12/2011   Procedure: OPEN REDUCTION INTERNAL FIXATION (ORIF) CALCANEOUS FRACTURE;  Surgeon: Wylene Simmer, MD;  Location: [facility];  Service: Orthopedics;  Laterality: Right;  ORIF RIGHT CALCANEOUS FRACTURE  . ORIF TIBIA FRACTURE  11/17/2011   Procedure: OPEN REDUCTION INTERNAL FIXATION (ORIF) TIBIA FRACTURE;  Surgeon: Wylene Simmer, MD;  Location: [facility];  Service: Orthopedics;  Laterality: Left;  REMOVAL OF EXFIX, ORIF LEFT TIBIAL PILON FRACTURE  . TONSILLECTOMY AND ADENOIDECTOMY  CHILD  . TRANSURETHRAL RESECTION OF BLADDER TUMOR WITH GYRUS (TURBT-GYRUS) N/A 08/31/2013   Procedure: TRANSURETHRAL RESECTION OF BLADDER TUMOR WITH GYRUS;  Surgeon: Jorja Loa, MD;  Location: [facility];  Service: Urology;  Laterality: N/A;  . TRANSURETHRAL RESECTION OF PROSTATE  X2   YRS AGO   BEFORE 1996  . TRANSURETHRAL RESECTION OF PROSTATE  09/07/2011   Procedure: TRANSURETHRAL RESECTION OF THE PROSTATE WITH GYRUS INSTRUMENTS;  Surgeon: Franchot Gallo, MD;  Location[facility];  Service: Urology;  Laterality: N/A;  . TRANSURETHRAL RESECTION OF PROSTATE N/A 08/31/2013   Procedure: TRANSURETHRAL RESECTION OF THE PROSTATE WITH GYRUS ;  Surgeon: Jorja Loa, MD;  Location: [facility];  Service: Urology;  Laterality: N/A;  . UMBILICAL HERNIA REPAIR N/A 04/16/2015   Procedure: LAPAROSCOPIC UMBILICAL HERNIA;  Surgeon: Michael Boston, MD;  Location: [facility];  Service: General;  Laterality: N/A;       Home Medications    Prior to  Admission medications   Medication Sig Start Date End Date Taking? Authorizing Provider  aspirin EC 81 MG tablet Take 81 mg by mouth every other day.   Yes Historical Provider, MD  citalopram (CELEXA) 40 MG tablet Take 20 mg by mouth daily.  07/22/15  Yes Historical Provider, MD  fluticasone (FLONASE) 50 MCG/ACT nasal spray Place 2 sprays into both nostrils daily.  04/16/12  Yes Historical Provider, MD  gabapentin (NEURONTIN) 600 MG tablet TAKE 1 TABLET (600 MG TOTAL) BY MOUTH 4 (FOUR) TIMES DAILY. 07/19/15  Yes Charlett Blake, MD  hydrochlorothiazide (HYDRODIURIL) 25 MG tablet Take 25 mg by mouth daily.   Yes Historical Provider, MD  losartan (COZAAR) 100 MG tablet Take 50 mg by mouth every morning.    Yes Historical Provider, MD  methocarbamol (ROBAXIN) 750 MG tablet Take 1 tablet (750 mg total) by mouth 4 (four) times daily as needed (use for muscle cramps/pain). 04/16/15  Yes Michael Boston, MD  pantoprazole (PROTONIX) 40 MG tablet Take 40 mg by mouth daily.   Yes Historical Provider, MD  rosuvastatin (CRESTOR)  10 MG tablet Take 10 mg by mouth at bedtime.   Yes Historical Provider, MD  thyroid (ARMOUR) 60 MG tablet Take 60 mg by mouth daily before breakfast.    Yes Historical Provider, MD    Family History Family History  Problem Relation Age of Onset  . Heart disease Father   . Colon cancer Neg Hx   . Esophageal cancer Neg Hx   . Rectal cancer Neg Hx   . Stomach cancer Neg Hx     Social History Social History  Substance Use Topics  . Smoking status: Former Smoker    Packs/day: 1.00    Years: 30.00    Types: Cigarettes    Quit date: 09/02/1994  . Smokeless tobacco: Never Used  . Alcohol use No     Allergies   Review of patient's allergies indicates no known allergies.   Review of Systems Review of Systems  Constitutional: Negative for diaphoresis, fever and malaise/fatigue.  HENT: Positive for congestion.   Eyes: Negative for photophobia and visual disturbance.    Respiratory: Negative for cough and shortness of breath.   Cardiovascular: Positive for syncope. Negative for chest pain and palpitations.  Gastrointestinal: Positive for nausea. Negative for abdominal pain, bowel incontinence and vomiting.  Genitourinary: Negative for bladder incontinence and dysuria.  Musculoskeletal: Negative for arthralgias and gait problem.  Skin: Negative for rash.  Neurological: Positive for light-headedness and headaches (chronic sinus headaches). Negative for focal weakness, seizures and weakness.  Psychiatric/Behavioral: Negative for agitation and confusion.     Physical Exam Updated Vital Signs BP 136/82   Pulse 88   Temp 97.6 F (36.4 C)   Resp 21   Ht 5\' 11"  (1.803 m)   Wt 83 kg   SpO2 96%   BMI 25.52 kg/m   Physical Exam  Constitutional: He is oriented to person, place, and time. He appears well-developed and well-nourished.  HENT:  Head: Normocephalic and atraumatic.  Eyes: Conjunctivae are normal.  Neck: Neck supple.  Cardiovascular: Normal rate and regular rhythm.   No murmur heard. Pulmonary/Chest: Effort normal and breath sounds normal. No respiratory distress. He has no wheezes. He has no rales.  Abdominal: Soft. There is no tenderness.  Musculoskeletal: He exhibits no edema.  Neurological: He is alert and oriented to person, place, and time. No cranial nerve deficit.  No focal weakness or numbness  Skin: Skin is warm and dry.  Psychiatric: He has a normal mood and affect. His behavior is normal.  Nursing note and vitals reviewed.    ED Treatments / Results  Labs (all labs ordered are listed, but only abnormal results are displayed) Labs Reviewed  CBC - Abnormal; Notable for the following:       Result Value   WBC 14.5 (*)    All other components within normal limits  BASIC METABOLIC PANEL  URINALYSIS, ROUTINE W REFLEX MICROSCOPIC (NOT AT [facility])  CBG MONITORING, ED    EKG  EKG Interpretation  Date/Time:  Monday September 09 2015 19:54:51 EDT Ventricular Rate:  78 PR Interval:    QRS Duration: 89 QT Interval:  419 QTC Calculation: 478 R Axis:   29 Text Interpretation:  Sinus rhythm Ventricular premature complex Left atrial enlargement Consider right ventricular hypertrophy Borderline T abnormalities, diffuse leads Minimal ST elevation, lateral leads Borderline prolonged QT interval artifact noted Confirmed by Christy Gentles  MD, DONALD ([id number]) on 09/09/2015 8:12:23 PM       Radiology Dg Ankle Complete Left  Result Date: 09/09/2015 CLINICAL  DATA:  65 year old male with fall and left ankle pain. EXAM: LEFT ANKLE COMPLETE - 3+ VIEW COMPARISON:  Intraoperative radiograph dated 11/09/2011 and CT dated 11/09/2011 FINDINGS: There is no acute fracture or dislocation. Old healed fracture of the distal tibia with extensive heterotopic bone formation and opacification of the distal tibia-fibular intraosseous membrane and distal tibia-fibular ankylosis. Heterotopic bone formation is also noted at the talotibial articulation. There is irregularity and mild flattening of the talar dome. The soft tissues appear unremarkable. IMPRESSION: No acute fracture or dislocation. Chronic changes of old healed distal tibial fracture with heterotopic bone formation. Electronically Signed   By: Anner Crete M.D.   On: 09/09/2015 23:16   Procedures Procedures (including critical care time)  Medications Ordered in ED Medications - No data to display   Initial Impression / Assessment and Plan / ED Course  I have reviewed the triage vital signs and the nursing notes.  Pertinent labs & imaging results that were available during my care of the patient were reviewed by me and considered in my medical decision making (see chart for details).  Clinical Course   Patient with history of myocardial infarction greater than 20 years ago with mild diastolic dysfunction per echo performed last week presenting today for likely vasovagal syncope. He  states he's had multiple episodes of syncope that are always preceded by acutely stressful circumstances. His last syncope was more than 1 year ago. He denies any prodromal chest pain, shortness of breath, diaphoresis, palpitations, or flushing. He states he felt lightheaded and knew he was going to pass out and then woke up on the floor. He had brief nausea in the ambulance that was relieved with Zofran.   I feel these symptoms represent low likelihood of cardiac etiology and are more likely related to a vasovagal episode as he felt prodromal lightheadedness and has had multiple previous episodes in similarly stressful circumstances.  Patient is well-appearing with normal vital signs in the emergency department. His EKG shows no evidence of WPW, Brugada, prolonged QT, ischemia, abnormal intervals, or dysrhythmia. He received an x-ray of the left ankle for pain at the site of previous orthopedic hardware after an injury at work. X-ray was unremarkable. Orthostatic vital signs within normal limits. He was able to ambulate in the emergency department prior to being discharged home.  He was discharged home in good condition with instructions to follow up with his cardiologist in the next week regarding these syncopal episodes. He was given strict return precautions.   Final Clinical Impressions(s) / ED Diagnoses   Final diagnoses:  Syncope and collapse    New Prescriptions Discharge Medication List as of 09/09/2015 10:58 PM       Allie Bossier, MD 09/09/15 2357    Ripley Fraise, MD 09/11/15 0000

## 2015-09-09 NOTE — ED Notes (Signed)
Urinal placed at bedside.

## 2015-09-09 NOTE — ED Notes (Signed)
Pt made aware of need for urinalysis

## 2015-09-09 NOTE — ED Notes (Signed)
Patient transported to X-ray 

## 2015-09-10 DIAGNOSIS — D72829 Elevated white blood cell count, unspecified: Secondary | ICD-10-CM | POA: Diagnosis not present

## 2015-09-19 DIAGNOSIS — R918 Other nonspecific abnormal finding of lung field: Secondary | ICD-10-CM | POA: Diagnosis not present

## 2015-09-19 DIAGNOSIS — Z87891 Personal history of nicotine dependence: Secondary | ICD-10-CM | POA: Diagnosis not present

## 2015-09-25 DIAGNOSIS — D72829 Elevated white blood cell count, unspecified: Secondary | ICD-10-CM | POA: Diagnosis not present

## 2015-09-27 DIAGNOSIS — D72829 Elevated white blood cell count, unspecified: Secondary | ICD-10-CM | POA: Diagnosis not present

## 2015-09-27 DIAGNOSIS — R61 Generalized hyperhidrosis: Secondary | ICD-10-CM | POA: Diagnosis not present

## 2015-09-27 DIAGNOSIS — J301 Allergic rhinitis due to pollen: Secondary | ICD-10-CM | POA: Diagnosis not present

## 2015-09-27 DIAGNOSIS — E784 Other hyperlipidemia: Secondary | ICD-10-CM | POA: Diagnosis not present

## 2015-09-27 DIAGNOSIS — I10 Essential (primary) hypertension: Secondary | ICD-10-CM | POA: Diagnosis not present

## 2015-09-27 DIAGNOSIS — R05 Cough: Secondary | ICD-10-CM | POA: Diagnosis not present

## 2015-10-04 DIAGNOSIS — J019 Acute sinusitis, unspecified: Secondary | ICD-10-CM | POA: Diagnosis not present

## 2015-10-04 DIAGNOSIS — R911 Solitary pulmonary nodule: Secondary | ICD-10-CM | POA: Diagnosis not present

## 2015-10-14 DIAGNOSIS — S42252A Displaced fracture of greater tuberosity of left humerus, initial encounter for closed fracture: Secondary | ICD-10-CM | POA: Diagnosis not present

## 2015-10-14 DIAGNOSIS — M25612 Stiffness of left shoulder, not elsewhere classified: Secondary | ICD-10-CM | POA: Diagnosis not present

## 2015-10-23 DIAGNOSIS — I2510 Atherosclerotic heart disease of native coronary artery without angina pectoris: Secondary | ICD-10-CM | POA: Diagnosis not present

## 2015-10-23 DIAGNOSIS — I700 Atherosclerosis of aorta: Secondary | ICD-10-CM | POA: Diagnosis not present

## 2015-10-23 DIAGNOSIS — J432 Centrilobular emphysema: Secondary | ICD-10-CM | POA: Diagnosis not present

## 2015-10-23 DIAGNOSIS — R911 Solitary pulmonary nodule: Secondary | ICD-10-CM | POA: Diagnosis not present

## 2015-11-25 DIAGNOSIS — M25612 Stiffness of left shoulder, not elsewhere classified: Secondary | ICD-10-CM | POA: Diagnosis not present

## 2015-11-25 DIAGNOSIS — S42252A Displaced fracture of greater tuberosity of left humerus, initial encounter for closed fracture: Secondary | ICD-10-CM | POA: Diagnosis not present

## 2016-01-27 ENCOUNTER — Ambulatory Visit: Payer: Self-pay | Admitting: Surgery

## 2016-01-27 DIAGNOSIS — R109 Unspecified abdominal pain: Secondary | ICD-10-CM | POA: Diagnosis not present

## 2016-01-27 DIAGNOSIS — K439 Ventral hernia without obstruction or gangrene: Secondary | ICD-10-CM | POA: Diagnosis not present

## 2016-01-27 DIAGNOSIS — Z01818 Encounter for other preprocedural examination: Secondary | ICD-10-CM | POA: Diagnosis not present

## 2016-01-27 NOTE — H&P (Signed)
Larry Randall 01/27/2016 3:20 PM Location: [facility] Patient #: [id number] DOB: 11/17/1950 Divorced / Language: Cleophus Molt / Race: White Male   History of Present Illness Larry Hector MD; 01/27/2016 3:54 PM) The patient is a 65 year old male presenting for a post-operative visit. Note for "Post-Operative": Patient returns status post laparoscopic repair of bilateral inguinal hernias and umbilical hernias with mesh in [id number].  Patient notes while he was walking on the treadmill and the summertime he felt a lump in the left upper area of his abdomen. Worried that hernia may have come back. Happened after I saw him in July. Did not call the past 5 months. In the past 3 weeks since gotten more painful and bothersome. Occasionally has some pulling in his right lower abdomen. He's been just try Use heat on it. Did not call for more pain medications. Has not tried anti-inflammatories. Did not try the gabapentin. However denies really any groin pain today. No difficulty with defecation or urination. No nausea or vomiting. No fevers or chills. He recall this, he said rather busy with other things.    04/16/2015  PATIENT: Larry Randall 65 y.o. male  Patient Care Team: Imagene Riches, NP as PCP - General Michael Boston, MD as Consulting Physician (General Surgery) Franchot Gallo, MD as Consulting Physician (Urology) Wylene Simmer, MD as Consulting Physician (Orthopedic Surgery)  PRE-OPERATIVE DIAGNOSIS: Bilateral inguinal hernias, umbilical hernia  POST-OPERATIVE DIAGNOSIS: Bilateral inguinal hernias, umbilical hernia  PROCEDURE:  LAPAROSCOPIC BILATERAL INGUINAL HERNIA REPAIR LAPAROSCOPIC UMBILICAL HERNIA REPAIR WITH MESH INSERTION OF MESH  SURGEON: Surgeon(s): Michael Boston, MD  ASSISTANT: RN  OR FINDINGS: Bilateral indirect inguinal hernias. Some direct space hernias as well bilaterally. No definite femoral or obturator hernias.  2 x 2  centimeter periumbilical ventral hernia.  Type of repair - Laparoscopic underlay repair  Name of mesh - Bard Ventralight dual sided (polypropylene / Seprafilm)  Size of mesh - Height 10 cm, Width 15 cm  Mesh overlap - 4-6 cm  Placement of mesh - Intraperitoneal underlay repair   Problem List/Past Medical Larry Hector, MD; 01/27/2016 3:34 PM) NON-RECURRENT BILATERAL INGUINAL HERNIA WITHOUT OBSTRUCTION OR GANGRENE ([id number])  UMBILICAL HERNIA WITHOUT OBSTRUCTION AND WITHOUT GANGRENE (K42.9)  ENCOUNTER FOR PRE-OPERATIVE EXAMINATION (Z01.818)  ABDOMINAL PAIN, ACUTE (R10.9)   Past Surgical History Larry Hector, MD; 01/27/2016 3:34 PM) Carotid Artery Surgery  Right. Foot Surgery  Bilateral. Prostate Surgery - Removal   Diagnostic Studies History Larry Hector, MD; 01/27/2016 3:34 PM) Colonoscopy  5-10 years ago  Allergies Nance Pear, [place]; 01/27/2016 3:20 PM) No Known Drug Allergies 03/19/2015  Medication History Nance Pear, CMA; 01/27/2016 3:20 PM) Robaxin-750 (750MG  Tablet, Oral) Active. Protonix (40MG  Tablet DR, Oral) Active. Gabapentin (600MG  Tablet, Oral) Active. Citalopram Hydrobromide (40MG  Tablet, Oral) Active. Flonase Allergy Relief (50MCG/ACT Suspension, Nasal) Active. Losartan Potassium (100MG  Tablet, Oral) Active. Omeprazole (10MG  Capsule DR, Oral) Active. Armour Thyroid (60MG  Tablet, Oral) Active. Medications Reconciled  Social History Larry Hector, MD; 01/27/2016 3:34 PM) Alcohol use  Remotely quit alcohol use. Caffeine use  Carbonated beverages, Coffee, Tea. No drug use  Tobacco use  Former smoker.  Family History Larry Hector, MD; 01/27/2016 3:34 PM) Arthritis  Father. Depression  Sister. Heart Disease  Father. Heart disease in male family member before age 30  Hypertension  Father, Mother. Thyroid problems  Sister.  Other Problems Larry Hector, MD; 01/27/2016 3:34 PM) Anxiety  Disorder  Arthritis  Back Pain  Depression  Enlarged  Prostate  Gastroesophageal Reflux Disease  High blood pressure  Hypercholesterolemia  Myocardial infarction  Sleep Apnea  Thyroid Disease  Vascular Disease   Vitals Bary Castilla Bradford Crowell; 01/27/2016 3:21 PM) 01/27/2016 3:20 PM Weight: 199.2 lb Height: 71in Body Surface Area: 2.11 m Body Mass Index: 27.78 kg/m  Temp.: 98.46F  Pulse: 87 (Regular)  BP: 132/88 (Sitting, Left Arm, Standard)       Physical Exam Larry Hector MD; 01/27/2016 3:51 PM) General Mental Status-Alert. General Appearance-Not in acute distress. Voice-Normal. Note: Relaxed. Nontoxic.   Integumentary Global Assessment Normal Exam - Distribution of scalp and body hair is normal. General Characteristics Overall Skin Surface - no rashes and no suspicious lesions.  Head and Neck Head-normocephalic, atraumatic with no lesions or palpable masses. Face Global Assessment - atraumatic, no absence of expression. Neck Global Assessment - no abnormal movements, no decreased range of motion. Trachea-midline. Thyroid Gland Characteristics - non-tender.  Eye Eyeball - Left-Extraocular movements intact, No Nystagmus. Eyeball - Right-Extraocular movements intact, No Nystagmus. Upper Eyelid - Left-No Cyanotic. Upper Eyelid - Right-No Cyanotic.  Chest and Lung Exam Inspection Accessory muscles - No use of accessory muscles in breathing.  Abdomen Note: 3 x 3 cm region of bulging left upper quadrants rather solvable. Reducible to 1 cm small hernia. Location where fascial stitch was removed. A. Mildly sensitive there. Mild discomfort right lower quadrant but no focal point of tenderness.  Incisions with normal healing ridges. No cellulitis. No guarding/rebound tenderness. Upper midline abdomen closed. Again with moderate diastases recti. No cellulitis or drainage.   Male Genitourinary Note: Normal  external genitalia. No inguinal hernias. No hematomas or seromas.   Peripheral Vascular Upper Extremity Inspection - Left - Not Gangrenous, No Petechiae. Right - Not Gangrenous, No Petechiae.  Neurologic Neurologic evaluation reveals -normal attention span and ability to concentrate, able to name objects and repeat phrases. Appropriate fund of knowledge and normal coordination.  Neuropsychiatric Mental status exam performed with findings of-able to articulate well with normal speech/language, rate, volume and coherence and no evidence of hallucinations, delusions, obsessions or homicidal/suicidal ideation. Orientation-oriented X3.  Musculoskeletal Global Assessment Gait and Station - normal gait and station.  Lymphatic General Lymphatics Description - No Generalized lymphadenopathy.    Assessment & Plan Larry Hector MD; 01/27/2016 3:50 PM) ABDOMINAL PAIN, ACUTE (R10.9) Impression: Pain at upper stitch and abdomen despite his numerous interventions for the past 3 months. I removed under local with sterile condition. No evidence of abscess or pus. No hernia recurrence. He tolerated well. Current Plans Pt Education - [facility] Pain Control ([place] [place]) Started Gabapentin 300MG , 1 (one) Capsule three times daily, #40, 01/27/2016, Ref. x1. Restarted Naproxen 500MG , 1 (one) Tablet two times daily, as needed, #30, 01/27/2016, Ref. x1. VENTRAL HERNIA WITHOUT OBSTRUCTION OR GANGRENE (K43.9) Impression: Small but definite hernia in the left upper quadrant. Is likely at the area where I removed a stitch that was very painful for him that he wished to have removed.  Challenging in this obese patient with significant diastases recti and very thin abdominal wall.  I think he would benefit from surgery to repair it since it is bothering him. Would use any another sheet of mesh. Trying consider a different material just in case. ENCOUNTER FOR PRE-OPERATIVE EXAMINATION (Z01.818) Current  Plans You are being scheduled for surgery- Our schedulers will call you.  You should hear from our office's scheduling department within 5 working days about the location, date, and time of surgery. We try to make accommodations for  patient's preferences in scheduling surgery, but sometimes the OR schedule or the surgeon's schedule prevents [place] from making those accommodations.  If you have not heard from our office [phone]) in 5 working days, call the office and ask for your surgeon's nurse.  If you have other questions about your diagnosis, plan, or surgery, call the office and ask for your surgeon's nurse.  Written instructions provided The anatomy & physiology of the abdominal wall was discussed. The pathophysiology of hernias was discussed. Natural history risks without surgery including progeressive enlargement, pain, incarceration, & strangulation was discussed. Contributors to complications such as smoking, obesity, diabetes, prior surgery, etc were discussed.  I feel the risks of no intervention will lead to serious problems that outweigh the operative risks; therefore, I recommended surgery to reduce and repair the hernia. I explained laparoscopic techniques with possible need for an open approach. I noted the probable use of mesh to patch and/or buttress the hernia repair  Risks such as bleeding, infection, abscess, need for further treatment, heart attack, death, and other risks were discussed. I noted a good likelihood this will help address the problem. Goals of post-operative recovery were discussed as well. Possibility that this will not correct all symptoms was explained. I stressed the importance of low-impact activity, aggressive pain control, avoiding constipation, & not pushing through pain to minimize risk of post-operative chronic pain or injury. Possibility of reherniation especially with smoking, obesity, diabetes, immunosuppression, and other health  conditions was discussed. We will work to minimize complications.  An educational handout further explaining the pathology & treatment options was given as well. Questions were answered. The patient expresses understanding & wishes to proceed with surgery.  Pt Education - [facility] Hernia Post-Op HCI ([place]): discussed with patient and provided information. Pt Education - Pamphlet Given - Laparoscopic Hernia Repair: discussed with patient and provided information.  Larry Randall, M.D., F.A.C.S. Gastrointestinal and Minimally Invasive Surgery [facility] [address], [place], [place] [address] [phone] [address] / Paging

## 2016-01-31 DIAGNOSIS — E039 Hypothyroidism, unspecified: Secondary | ICD-10-CM | POA: Diagnosis not present

## 2016-01-31 DIAGNOSIS — R1010 Upper abdominal pain, unspecified: Secondary | ICD-10-CM | POA: Diagnosis not present

## 2016-01-31 DIAGNOSIS — Z125 Encounter for screening for malignant neoplasm of prostate: Secondary | ICD-10-CM | POA: Diagnosis not present

## 2016-01-31 DIAGNOSIS — I10 Essential (primary) hypertension: Secondary | ICD-10-CM | POA: Diagnosis not present

## 2016-02-16 DIAGNOSIS — J0100 Acute maxillary sinusitis, unspecified: Secondary | ICD-10-CM | POA: Diagnosis not present

## 2016-02-16 DIAGNOSIS — J209 Acute bronchitis, unspecified: Secondary | ICD-10-CM | POA: Diagnosis not present

## 2016-02-20 DIAGNOSIS — Z8719 Personal history of other diseases of the digestive system: Secondary | ICD-10-CM | POA: Diagnosis not present

## 2016-02-20 DIAGNOSIS — Z9889 Other specified postprocedural states: Secondary | ICD-10-CM | POA: Diagnosis not present

## 2016-02-20 DIAGNOSIS — R1900 Intra-abdominal and pelvic swelling, mass and lump, unspecified site: Secondary | ICD-10-CM | POA: Diagnosis not present

## 2016-02-25 DIAGNOSIS — R1900 Intra-abdominal and pelvic swelling, mass and lump, unspecified site: Secondary | ICD-10-CM | POA: Diagnosis not present

## 2016-02-25 DIAGNOSIS — Z0389 Encounter for observation for other suspected diseases and conditions ruled out: Secondary | ICD-10-CM | POA: Diagnosis not present

## 2016-02-27 DIAGNOSIS — J0191 Acute recurrent sinusitis, unspecified: Secondary | ICD-10-CM | POA: Diagnosis not present

## 2016-02-27 DIAGNOSIS — R911 Solitary pulmonary nodule: Secondary | ICD-10-CM | POA: Diagnosis not present

## 2016-02-27 DIAGNOSIS — R05 Cough: Secondary | ICD-10-CM | POA: Diagnosis not present

## 2016-02-28 DIAGNOSIS — Z8719 Personal history of other diseases of the digestive system: Secondary | ICD-10-CM | POA: Diagnosis not present

## 2016-02-28 DIAGNOSIS — Z9889 Other specified postprocedural states: Secondary | ICD-10-CM | POA: Diagnosis not present

## 2016-02-28 DIAGNOSIS — K432 Incisional hernia without obstruction or gangrene: Secondary | ICD-10-CM | POA: Diagnosis not present

## 2016-03-09 DIAGNOSIS — Z01818 Encounter for other preprocedural examination: Secondary | ICD-10-CM | POA: Diagnosis not present

## 2016-03-09 DIAGNOSIS — I219 Acute myocardial infarction, unspecified: Secondary | ICD-10-CM

## 2016-03-09 DIAGNOSIS — K432 Incisional hernia without obstruction or gangrene: Secondary | ICD-10-CM | POA: Diagnosis not present

## 2016-03-09 DIAGNOSIS — M1990 Unspecified osteoarthritis, unspecified site: Secondary | ICD-10-CM | POA: Insufficient documentation

## 2016-03-09 HISTORY — DX: Acute myocardial infarction, unspecified: I21.9

## 2016-03-11 DIAGNOSIS — K432 Incisional hernia without obstruction or gangrene: Secondary | ICD-10-CM | POA: Diagnosis not present

## 2016-03-11 DIAGNOSIS — I10 Essential (primary) hypertension: Secondary | ICD-10-CM | POA: Diagnosis not present

## 2016-03-11 DIAGNOSIS — Z79899 Other long term (current) drug therapy: Secondary | ICD-10-CM | POA: Diagnosis not present

## 2016-03-11 DIAGNOSIS — I2510 Atherosclerotic heart disease of native coronary artery without angina pectoris: Secondary | ICD-10-CM | POA: Diagnosis not present

## 2016-05-05 DIAGNOSIS — I10 Essential (primary) hypertension: Secondary | ICD-10-CM | POA: Diagnosis not present

## 2016-05-05 DIAGNOSIS — R079 Chest pain, unspecified: Secondary | ICD-10-CM | POA: Diagnosis not present

## 2016-05-05 DIAGNOSIS — K210 Gastro-esophageal reflux disease with esophagitis: Secondary | ICD-10-CM | POA: Diagnosis not present

## 2016-05-05 DIAGNOSIS — F419 Anxiety disorder, unspecified: Secondary | ICD-10-CM | POA: Diagnosis not present

## 2016-05-05 DIAGNOSIS — R1010 Upper abdominal pain, unspecified: Secondary | ICD-10-CM | POA: Diagnosis not present

## 2016-11-12 DIAGNOSIS — E559 Vitamin D deficiency, unspecified: Secondary | ICD-10-CM | POA: Diagnosis not present

## 2016-11-12 DIAGNOSIS — M25572 Pain in left ankle and joints of left foot: Secondary | ICD-10-CM | POA: Diagnosis not present

## 2016-11-12 DIAGNOSIS — E039 Hypothyroidism, unspecified: Secondary | ICD-10-CM | POA: Diagnosis not present

## 2016-11-12 DIAGNOSIS — J019 Acute sinusitis, unspecified: Secondary | ICD-10-CM | POA: Diagnosis not present

## 2016-11-12 DIAGNOSIS — G9009 Other idiopathic peripheral autonomic neuropathy: Secondary | ICD-10-CM | POA: Diagnosis not present

## 2016-11-12 DIAGNOSIS — I10 Essential (primary) hypertension: Secondary | ICD-10-CM | POA: Diagnosis not present

## 2016-11-13 DIAGNOSIS — F332 Major depressive disorder, recurrent severe without psychotic features: Secondary | ICD-10-CM | POA: Diagnosis not present

## 2016-11-13 DIAGNOSIS — E039 Hypothyroidism, unspecified: Secondary | ICD-10-CM | POA: Diagnosis not present

## 2016-11-13 DIAGNOSIS — Z79899 Other long term (current) drug therapy: Secondary | ICD-10-CM | POA: Diagnosis not present

## 2016-11-13 DIAGNOSIS — K219 Gastro-esophageal reflux disease without esophagitis: Secondary | ICD-10-CM | POA: Diagnosis not present

## 2016-11-13 DIAGNOSIS — I10 Essential (primary) hypertension: Secondary | ICD-10-CM | POA: Diagnosis not present

## 2016-11-13 DIAGNOSIS — F419 Anxiety disorder, unspecified: Secondary | ICD-10-CM | POA: Diagnosis not present

## 2016-11-13 DIAGNOSIS — R5383 Other fatigue: Secondary | ICD-10-CM | POA: Diagnosis not present

## 2016-11-13 DIAGNOSIS — E784 Other hyperlipidemia: Secondary | ICD-10-CM | POA: Diagnosis not present

## 2016-12-02 DIAGNOSIS — M5412 Radiculopathy, cervical region: Secondary | ICD-10-CM | POA: Diagnosis not present

## 2016-12-22 DIAGNOSIS — M5412 Radiculopathy, cervical region: Secondary | ICD-10-CM | POA: Diagnosis not present

## 2016-12-22 DIAGNOSIS — I10 Essential (primary) hypertension: Secondary | ICD-10-CM | POA: Diagnosis not present

## 2016-12-30 DIAGNOSIS — N401 Enlarged prostate with lower urinary tract symptoms: Secondary | ICD-10-CM | POA: Diagnosis not present

## 2016-12-30 DIAGNOSIS — R351 Nocturia: Secondary | ICD-10-CM | POA: Diagnosis not present

## 2017-03-05 IMAGING — CT CT CHEST W/ CM
2 of 3 series · 15 of 36 positions shown, 18 images · IV contrast (OMNIPAQUE)
Comparison: 05/31/2014 as well as PET-CT 06/25/2014

CLINICAL DATA: Cough and abnormal chest x-ray.  Nonsmoker.

EXAM:
CT CHEST WITH CONTRAST
TECHNIQUE: Multidetector CT imaging of the chest was performed during
intravenous contrast administration.
CONTRAST:  100mL OMNIPAQUE IOHEXOL 300 MG/ML  SOLN

[Series 2: chest with st · axial · 0.74mm/px · z∈[-340,-60]mm · 12 of 66 slices shown, 15 images]
[im 5/66  mediastinal]
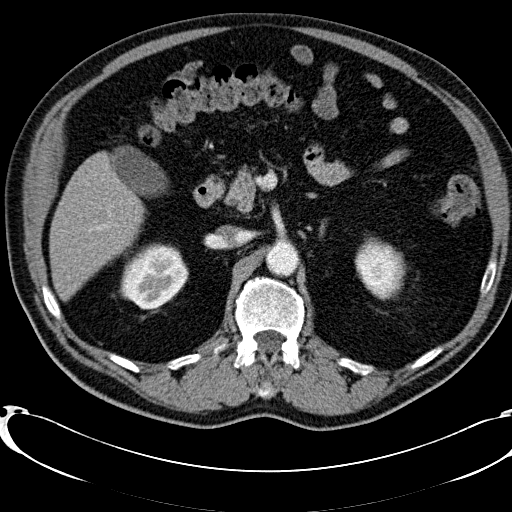
[im 5/66  lung]
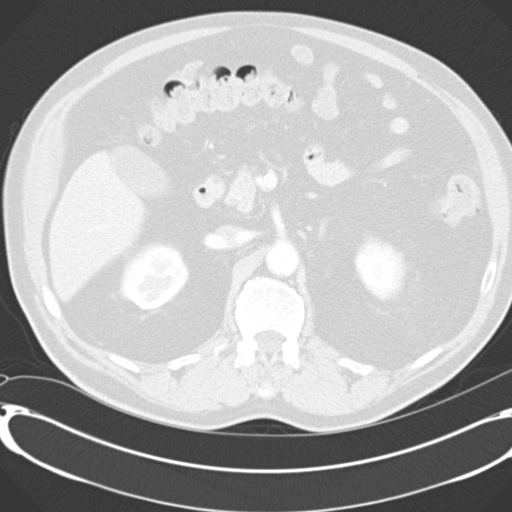
[im 10/66  lung]
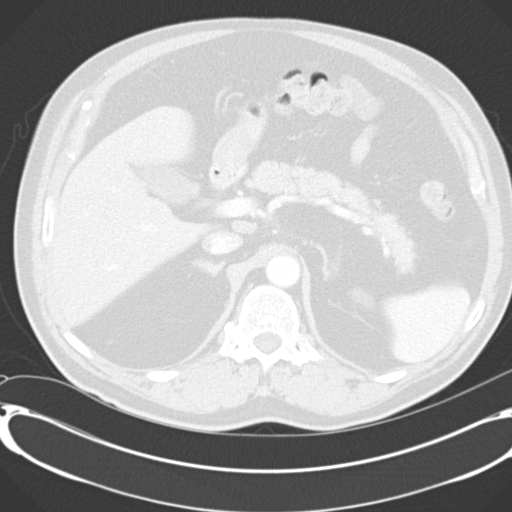
[im 15/66  lung]
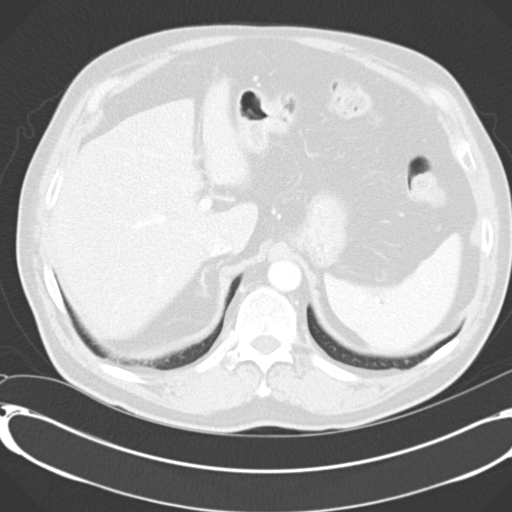
[im 20/66  lung]
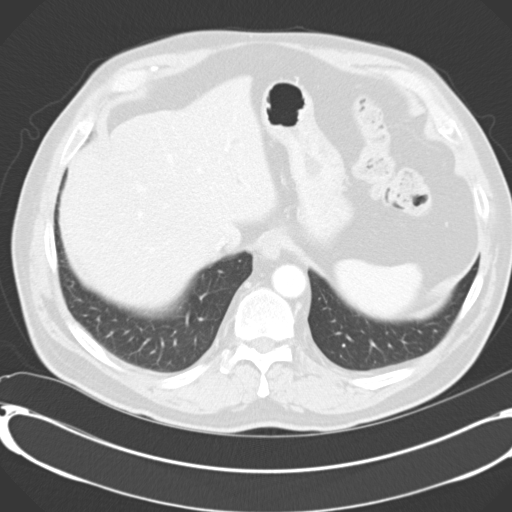
[im 25/66  mediastinal]
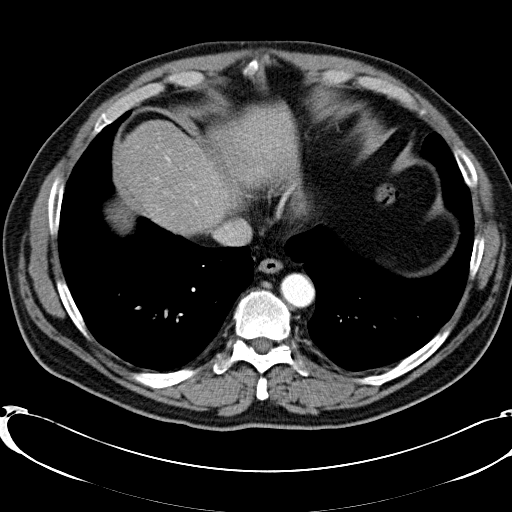
[im 25/66  lung]
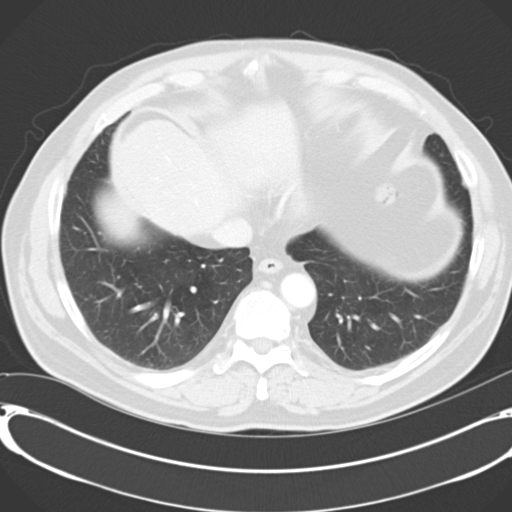
[im 29/66  lung]
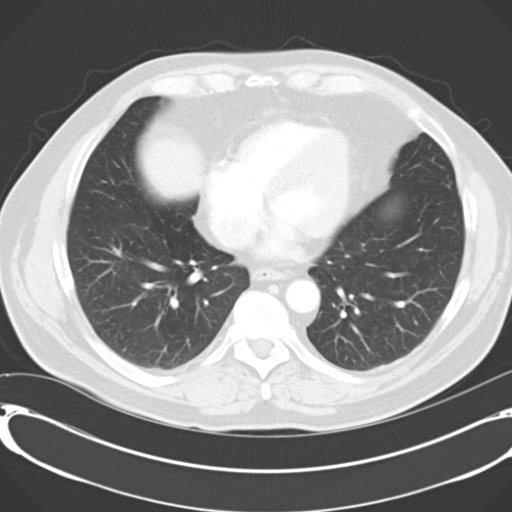
[im 37/66  lung]
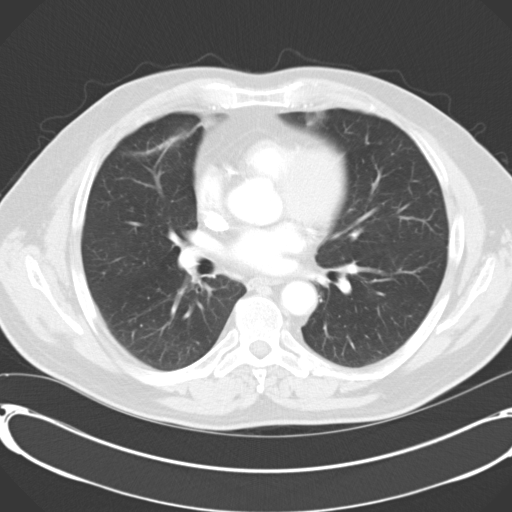
[im 41/66  lung]
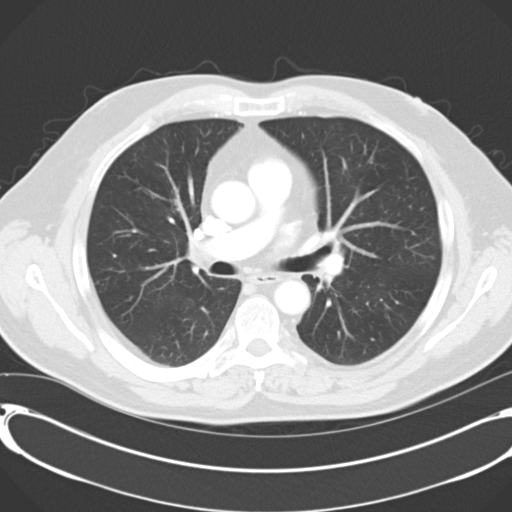
[im 46/66  mediastinal]
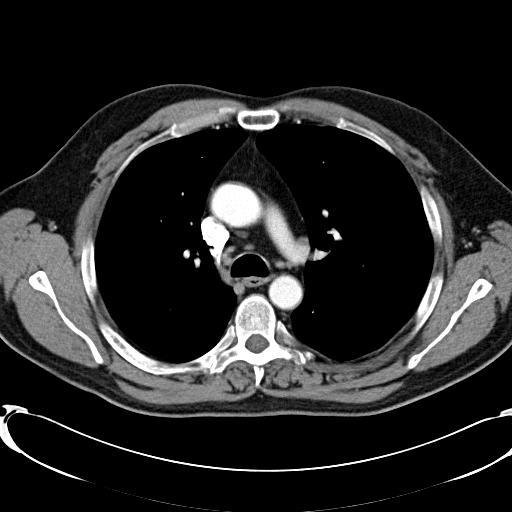
[im 46/66  lung]
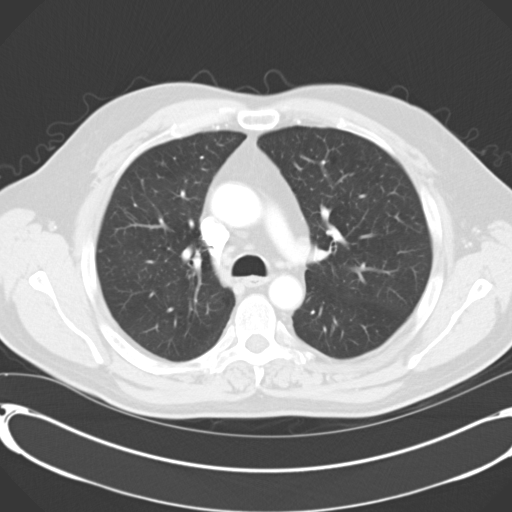
[im 51/66  lung]
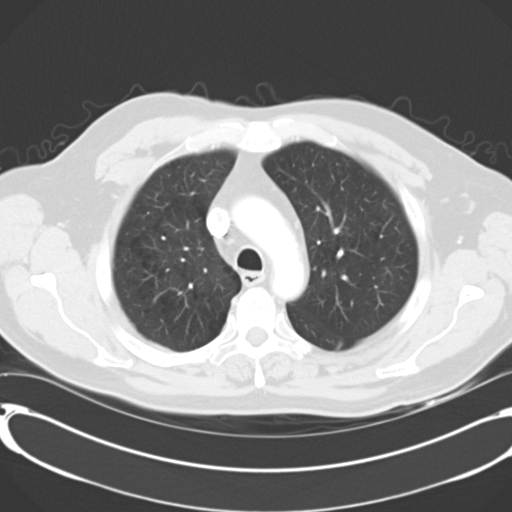
[im 56/66  lung]
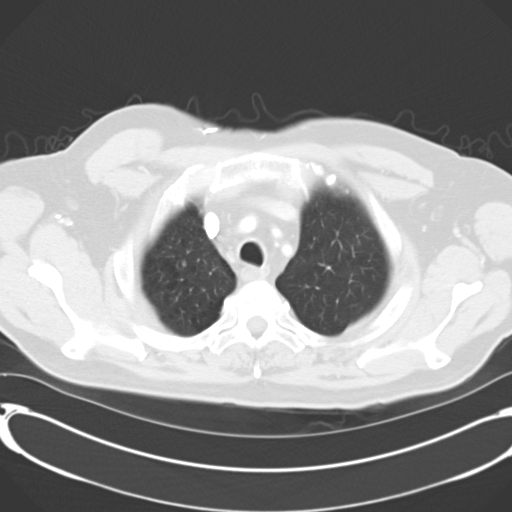
[im 61/66  lung]
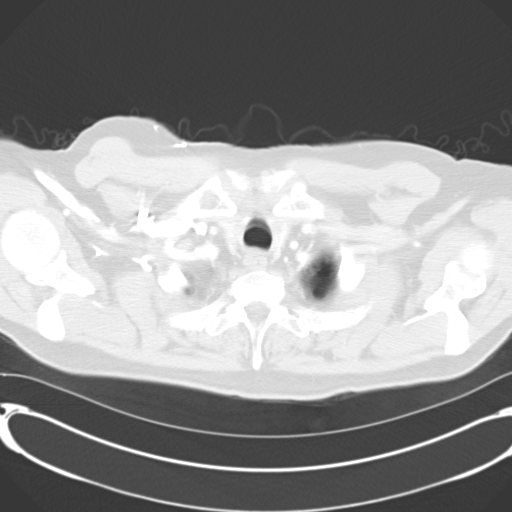

[Series 602: coronal · coronal · 0.74mm/px · 3 of 125 slices shown]
[im 25/125  lung]
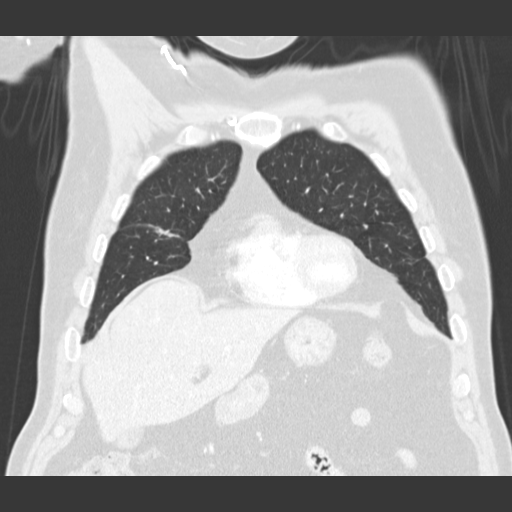
[im 50/125  lung]
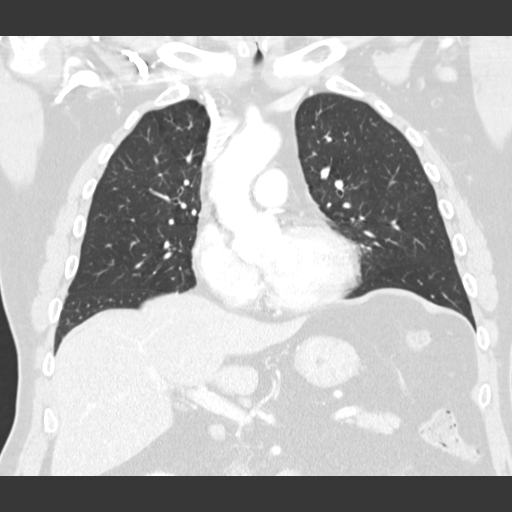
[im 75/125  lung]
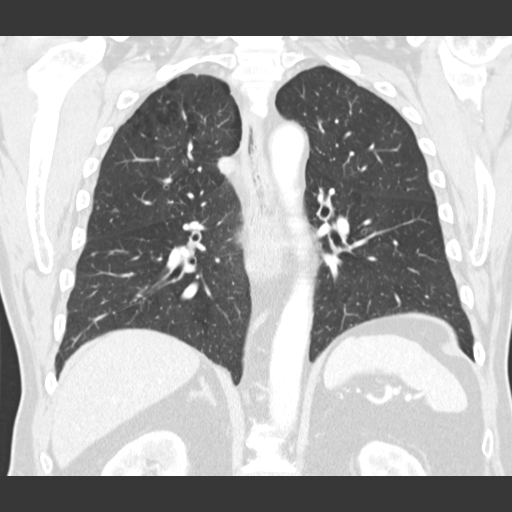

[15 of 36 positions shown; findings below may reference images not displayed]

FINDINGS: Lungs well inflated demonstrate no change in a 1.1 cm well-defined
oval nodule over the superior segment of the left lower lobe.
Several other smaller sub cm bilateral pulmonary nodules are
unchanged. No new nodules identified. There is mild emphysematous
disease. No evidence of consolidation or effusion. Airways are
within normal.

Heart is normal size. Calcified plaque is present over the right
coronary and left anterior descending coronary arteries. There is no
significant mediastinal, hilar or axillary adenopathy. There is
minimal calcified plaque over the aortic arch. Remaining mediastinal
structures are within normal.

Images through the upper abdomen are within normal. Remaining bones
and soft tissues are within normal.
IMPRESSION: Stable bilateral pulmonary nodules with the largest measuring 1.1 cm
over the superior segment of the left lower lobe. No new nodules.
Recommend additional follow-up chest CT in 9 months to document
continued stability. This recommendation follows the consensus
statement: Guidelines for Management of Small Pulmonary Nodules
Detected on CT Scans: A Statement from the [HOSPITAL] as
[URL]

Mild emphysematous disease.

Atherosclerotic coronary artery disease.

## 2017-05-31 DIAGNOSIS — M25572 Pain in left ankle and joints of left foot: Secondary | ICD-10-CM | POA: Diagnosis not present

## 2017-05-31 DIAGNOSIS — I10 Essential (primary) hypertension: Secondary | ICD-10-CM | POA: Diagnosis not present

## 2017-05-31 DIAGNOSIS — E039 Hypothyroidism, unspecified: Secondary | ICD-10-CM | POA: Diagnosis not present

## 2017-05-31 DIAGNOSIS — J0191 Acute recurrent sinusitis, unspecified: Secondary | ICD-10-CM | POA: Diagnosis not present

## 2017-05-31 DIAGNOSIS — R61 Generalized hyperhidrosis: Secondary | ICD-10-CM | POA: Diagnosis not present

## 2017-05-31 DIAGNOSIS — K219 Gastro-esophageal reflux disease without esophagitis: Secondary | ICD-10-CM | POA: Diagnosis not present

## 2017-05-31 DIAGNOSIS — R05 Cough: Secondary | ICD-10-CM | POA: Diagnosis not present

## 2017-05-31 DIAGNOSIS — J019 Acute sinusitis, unspecified: Secondary | ICD-10-CM | POA: Diagnosis not present

## 2017-07-15 DIAGNOSIS — I10 Essential (primary) hypertension: Secondary | ICD-10-CM | POA: Diagnosis not present

## 2017-07-15 DIAGNOSIS — J019 Acute sinusitis, unspecified: Secondary | ICD-10-CM | POA: Diagnosis not present

## 2017-07-15 DIAGNOSIS — E039 Hypothyroidism, unspecified: Secondary | ICD-10-CM | POA: Diagnosis not present

## 2017-07-15 DIAGNOSIS — J301 Allergic rhinitis due to pollen: Secondary | ICD-10-CM | POA: Diagnosis not present

## 2017-09-29 DIAGNOSIS — N400 Enlarged prostate without lower urinary tract symptoms: Secondary | ICD-10-CM | POA: Diagnosis not present

## 2017-09-29 DIAGNOSIS — R300 Dysuria: Secondary | ICD-10-CM | POA: Diagnosis not present

## 2017-10-07 DIAGNOSIS — N401 Enlarged prostate with lower urinary tract symptoms: Secondary | ICD-10-CM | POA: Diagnosis not present

## 2017-11-02 DIAGNOSIS — I10 Essential (primary) hypertension: Secondary | ICD-10-CM | POA: Diagnosis not present

## 2017-11-02 DIAGNOSIS — R05 Cough: Secondary | ICD-10-CM | POA: Diagnosis not present

## 2017-11-02 DIAGNOSIS — K130 Diseases of lips: Secondary | ICD-10-CM | POA: Diagnosis not present

## 2017-11-02 DIAGNOSIS — F419 Anxiety disorder, unspecified: Secondary | ICD-10-CM | POA: Diagnosis not present

## 2017-11-02 DIAGNOSIS — R911 Solitary pulmonary nodule: Secondary | ICD-10-CM | POA: Diagnosis not present

## 2017-11-02 DIAGNOSIS — R509 Fever, unspecified: Secondary | ICD-10-CM | POA: Diagnosis not present

## 2017-11-10 DIAGNOSIS — D513 Other dietary vitamin B12 deficiency anemia: Secondary | ICD-10-CM | POA: Diagnosis not present

## 2017-11-18 DIAGNOSIS — K130 Diseases of lips: Secondary | ICD-10-CM | POA: Diagnosis not present

## 2017-11-18 DIAGNOSIS — D513 Other dietary vitamin B12 deficiency anemia: Secondary | ICD-10-CM | POA: Diagnosis not present

## 2017-11-29 DIAGNOSIS — G44209 Tension-type headache, unspecified, not intractable: Secondary | ICD-10-CM | POA: Diagnosis not present

## 2017-11-29 DIAGNOSIS — K130 Diseases of lips: Secondary | ICD-10-CM | POA: Diagnosis not present

## 2017-11-29 DIAGNOSIS — D513 Other dietary vitamin B12 deficiency anemia: Secondary | ICD-10-CM | POA: Diagnosis not present

## 2017-12-01 DIAGNOSIS — R918 Other nonspecific abnormal finding of lung field: Secondary | ICD-10-CM | POA: Diagnosis not present

## 2017-12-09 DIAGNOSIS — D513 Other dietary vitamin B12 deficiency anemia: Secondary | ICD-10-CM | POA: Diagnosis not present

## 2017-12-16 DIAGNOSIS — R0602 Shortness of breath: Secondary | ICD-10-CM | POA: Diagnosis not present

## 2017-12-21 DIAGNOSIS — J309 Allergic rhinitis, unspecified: Secondary | ICD-10-CM | POA: Diagnosis not present

## 2017-12-29 DIAGNOSIS — N401 Enlarged prostate with lower urinary tract symptoms: Secondary | ICD-10-CM | POA: Diagnosis not present

## 2017-12-29 DIAGNOSIS — R3912 Poor urinary stream: Secondary | ICD-10-CM | POA: Diagnosis not present

## 2018-01-27 DIAGNOSIS — D513 Other dietary vitamin B12 deficiency anemia: Secondary | ICD-10-CM | POA: Diagnosis not present

## 2018-02-02 DIAGNOSIS — D513 Other dietary vitamin B12 deficiency anemia: Secondary | ICD-10-CM | POA: Diagnosis not present

## 2018-03-09 DIAGNOSIS — D513 Other dietary vitamin B12 deficiency anemia: Secondary | ICD-10-CM | POA: Diagnosis not present

## 2018-03-09 DIAGNOSIS — J309 Allergic rhinitis, unspecified: Secondary | ICD-10-CM | POA: Diagnosis not present

## 2018-04-14 DIAGNOSIS — R197 Diarrhea, unspecified: Secondary | ICD-10-CM | POA: Diagnosis not present

## 2018-04-14 DIAGNOSIS — R112 Nausea with vomiting, unspecified: Secondary | ICD-10-CM | POA: Diagnosis not present

## 2018-05-05 DIAGNOSIS — R1030 Lower abdominal pain, unspecified: Secondary | ICD-10-CM | POA: Diagnosis not present

## 2018-05-05 DIAGNOSIS — E539 Vitamin B deficiency, unspecified: Secondary | ICD-10-CM | POA: Diagnosis not present

## 2018-05-05 DIAGNOSIS — J309 Allergic rhinitis, unspecified: Secondary | ICD-10-CM | POA: Diagnosis not present

## 2018-05-05 DIAGNOSIS — I10 Essential (primary) hypertension: Secondary | ICD-10-CM | POA: Diagnosis not present

## 2018-05-05 DIAGNOSIS — R300 Dysuria: Secondary | ICD-10-CM | POA: Diagnosis not present

## 2018-06-02 DIAGNOSIS — K146 Glossodynia: Secondary | ICD-10-CM | POA: Diagnosis not present

## 2018-06-02 DIAGNOSIS — F339 Major depressive disorder, recurrent, unspecified: Secondary | ICD-10-CM | POA: Diagnosis not present

## 2018-06-16 DIAGNOSIS — F339 Major depressive disorder, recurrent, unspecified: Secondary | ICD-10-CM | POA: Diagnosis not present

## 2018-06-16 DIAGNOSIS — K146 Glossodynia: Secondary | ICD-10-CM | POA: Diagnosis not present

## 2018-06-17 DIAGNOSIS — R1030 Lower abdominal pain, unspecified: Secondary | ICD-10-CM | POA: Diagnosis not present

## 2018-06-17 DIAGNOSIS — N200 Calculus of kidney: Secondary | ICD-10-CM | POA: Diagnosis not present

## 2018-06-17 DIAGNOSIS — N281 Cyst of kidney, acquired: Secondary | ICD-10-CM | POA: Diagnosis not present

## 2018-09-15 DIAGNOSIS — J309 Allergic rhinitis, unspecified: Secondary | ICD-10-CM | POA: Diagnosis not present

## 2018-09-15 DIAGNOSIS — I10 Essential (primary) hypertension: Secondary | ICD-10-CM | POA: Diagnosis not present

## 2018-09-15 DIAGNOSIS — K219 Gastro-esophageal reflux disease without esophagitis: Secondary | ICD-10-CM | POA: Diagnosis not present

## 2018-09-15 DIAGNOSIS — Z20828 Contact with and (suspected) exposure to other viral communicable diseases: Secondary | ICD-10-CM | POA: Diagnosis not present

## 2019-02-06 DIAGNOSIS — R109 Unspecified abdominal pain: Secondary | ICD-10-CM | POA: Diagnosis not present

## 2019-02-06 DIAGNOSIS — R197 Diarrhea, unspecified: Secondary | ICD-10-CM | POA: Diagnosis not present

## 2019-02-07 DIAGNOSIS — R197 Diarrhea, unspecified: Secondary | ICD-10-CM | POA: Diagnosis not present

## 2019-02-07 DIAGNOSIS — Z79899 Other long term (current) drug therapy: Secondary | ICD-10-CM | POA: Diagnosis not present

## 2019-02-07 DIAGNOSIS — I2510 Atherosclerotic heart disease of native coronary artery without angina pectoris: Secondary | ICD-10-CM | POA: Diagnosis not present

## 2019-02-07 DIAGNOSIS — R1084 Generalized abdominal pain: Secondary | ICD-10-CM | POA: Diagnosis not present

## 2019-02-07 DIAGNOSIS — I10 Essential (primary) hypertension: Secondary | ICD-10-CM | POA: Diagnosis not present

## 2019-02-07 DIAGNOSIS — E039 Hypothyroidism, unspecified: Secondary | ICD-10-CM | POA: Diagnosis not present

## 2019-02-07 DIAGNOSIS — R109 Unspecified abdominal pain: Secondary | ICD-10-CM | POA: Diagnosis not present

## 2019-02-13 DIAGNOSIS — R1084 Generalized abdominal pain: Secondary | ICD-10-CM | POA: Diagnosis not present

## 2019-02-13 DIAGNOSIS — R197 Diarrhea, unspecified: Secondary | ICD-10-CM | POA: Diagnosis not present

## 2019-04-26 DIAGNOSIS — R05 Cough: Secondary | ICD-10-CM | POA: Diagnosis not present

## 2019-04-26 DIAGNOSIS — R519 Headache, unspecified: Secondary | ICD-10-CM | POA: Diagnosis not present

## 2019-04-26 DIAGNOSIS — J019 Acute sinusitis, unspecified: Secondary | ICD-10-CM | POA: Diagnosis not present

## 2019-06-12 DIAGNOSIS — Z6827 Body mass index (BMI) 27.0-27.9, adult: Secondary | ICD-10-CM | POA: Diagnosis not present

## 2019-06-12 DIAGNOSIS — M25511 Pain in right shoulder: Secondary | ICD-10-CM | POA: Diagnosis not present

## 2019-06-12 DIAGNOSIS — M25512 Pain in left shoulder: Secondary | ICD-10-CM | POA: Diagnosis not present

## 2019-06-12 DIAGNOSIS — L958 Other vasculitis limited to the skin: Secondary | ICD-10-CM | POA: Diagnosis not present

## 2019-06-19 DIAGNOSIS — N281 Cyst of kidney, acquired: Secondary | ICD-10-CM | POA: Diagnosis not present

## 2019-06-20 DIAGNOSIS — M25511 Pain in right shoulder: Secondary | ICD-10-CM | POA: Diagnosis not present

## 2019-06-20 DIAGNOSIS — M542 Cervicalgia: Secondary | ICD-10-CM | POA: Diagnosis not present

## 2019-06-20 DIAGNOSIS — M25512 Pain in left shoulder: Secondary | ICD-10-CM | POA: Diagnosis not present

## 2019-06-22 DIAGNOSIS — I10 Essential (primary) hypertension: Secondary | ICD-10-CM | POA: Diagnosis not present

## 2019-07-03 DIAGNOSIS — R202 Paresthesia of skin: Secondary | ICD-10-CM | POA: Diagnosis not present

## 2019-07-03 DIAGNOSIS — M25512 Pain in left shoulder: Secondary | ICD-10-CM | POA: Diagnosis not present

## 2019-07-03 DIAGNOSIS — M25511 Pain in right shoulder: Secondary | ICD-10-CM | POA: Diagnosis not present

## 2019-07-04 DIAGNOSIS — Z23 Encounter for immunization: Secondary | ICD-10-CM | POA: Diagnosis not present

## 2019-07-28 DIAGNOSIS — Z23 Encounter for immunization: Secondary | ICD-10-CM | POA: Diagnosis not present

## 2019-10-16 DIAGNOSIS — E039 Hypothyroidism, unspecified: Secondary | ICD-10-CM | POA: Diagnosis not present

## 2019-10-16 DIAGNOSIS — F419 Anxiety disorder, unspecified: Secondary | ICD-10-CM | POA: Diagnosis not present

## 2019-10-16 DIAGNOSIS — Z0100 Encounter for examination of eyes and vision without abnormal findings: Secondary | ICD-10-CM | POA: Diagnosis not present

## 2019-10-16 DIAGNOSIS — Z0000 Encounter for general adult medical examination without abnormal findings: Secondary | ICD-10-CM | POA: Diagnosis not present

## 2019-10-16 DIAGNOSIS — E785 Hyperlipidemia, unspecified: Secondary | ICD-10-CM | POA: Diagnosis not present

## 2019-10-16 DIAGNOSIS — E559 Vitamin D deficiency, unspecified: Secondary | ICD-10-CM | POA: Diagnosis not present

## 2019-10-16 DIAGNOSIS — J309 Allergic rhinitis, unspecified: Secondary | ICD-10-CM | POA: Diagnosis not present

## 2019-10-19 DIAGNOSIS — D513 Other dietary vitamin B12 deficiency anemia: Secondary | ICD-10-CM | POA: Diagnosis not present

## 2019-10-26 DIAGNOSIS — D513 Other dietary vitamin B12 deficiency anemia: Secondary | ICD-10-CM | POA: Diagnosis not present

## 2019-11-02 DIAGNOSIS — D513 Other dietary vitamin B12 deficiency anemia: Secondary | ICD-10-CM | POA: Diagnosis not present

## 2019-11-09 DIAGNOSIS — D513 Other dietary vitamin B12 deficiency anemia: Secondary | ICD-10-CM | POA: Diagnosis not present

## 2019-11-13 DIAGNOSIS — F419 Anxiety disorder, unspecified: Secondary | ICD-10-CM | POA: Diagnosis not present

## 2019-11-13 DIAGNOSIS — J309 Allergic rhinitis, unspecified: Secondary | ICD-10-CM | POA: Diagnosis not present

## 2020-01-26 DIAGNOSIS — R3912 Poor urinary stream: Secondary | ICD-10-CM | POA: Diagnosis not present

## 2020-01-26 DIAGNOSIS — N401 Enlarged prostate with lower urinary tract symptoms: Secondary | ICD-10-CM | POA: Diagnosis not present

## 2020-01-26 DIAGNOSIS — R102 Pelvic and perineal pain: Secondary | ICD-10-CM | POA: Diagnosis not present

## 2020-01-26 DIAGNOSIS — R310 Gross hematuria: Secondary | ICD-10-CM | POA: Diagnosis not present

## 2020-02-05 DIAGNOSIS — N200 Calculus of kidney: Secondary | ICD-10-CM | POA: Diagnosis not present

## 2020-02-05 DIAGNOSIS — R310 Gross hematuria: Secondary | ICD-10-CM | POA: Diagnosis not present

## 2020-02-05 DIAGNOSIS — N400 Enlarged prostate without lower urinary tract symptoms: Secondary | ICD-10-CM | POA: Diagnosis not present

## 2020-02-05 DIAGNOSIS — R102 Pelvic and perineal pain: Secondary | ICD-10-CM | POA: Diagnosis not present

## 2020-03-13 DIAGNOSIS — N401 Enlarged prostate with lower urinary tract symptoms: Secondary | ICD-10-CM | POA: Diagnosis not present

## 2020-03-13 DIAGNOSIS — R351 Nocturia: Secondary | ICD-10-CM | POA: Diagnosis not present

## 2020-03-13 DIAGNOSIS — R310 Gross hematuria: Secondary | ICD-10-CM | POA: Diagnosis not present

## 2020-05-09 DIAGNOSIS — E039 Hypothyroidism, unspecified: Secondary | ICD-10-CM | POA: Diagnosis not present

## 2020-05-09 DIAGNOSIS — Z125 Encounter for screening for malignant neoplasm of prostate: Secondary | ICD-10-CM | POA: Diagnosis not present

## 2020-05-09 DIAGNOSIS — E785 Hyperlipidemia, unspecified: Secondary | ICD-10-CM | POA: Diagnosis not present

## 2020-05-09 DIAGNOSIS — R799 Abnormal finding of blood chemistry, unspecified: Secondary | ICD-10-CM | POA: Diagnosis not present

## 2020-05-09 DIAGNOSIS — F419 Anxiety disorder, unspecified: Secondary | ICD-10-CM | POA: Diagnosis not present

## 2020-05-09 DIAGNOSIS — J309 Allergic rhinitis, unspecified: Secondary | ICD-10-CM | POA: Diagnosis not present

## 2020-05-09 DIAGNOSIS — E559 Vitamin D deficiency, unspecified: Secondary | ICD-10-CM | POA: Diagnosis not present

## 2020-06-12 DIAGNOSIS — R310 Gross hematuria: Secondary | ICD-10-CM | POA: Diagnosis not present

## 2020-06-12 DIAGNOSIS — N400 Enlarged prostate without lower urinary tract symptoms: Secondary | ICD-10-CM | POA: Diagnosis not present

## 2020-07-08 DIAGNOSIS — J342 Deviated nasal septum: Secondary | ICD-10-CM | POA: Diagnosis not present

## 2020-07-08 DIAGNOSIS — J300 Vasomotor rhinitis: Secondary | ICD-10-CM | POA: Diagnosis not present

## 2020-07-08 DIAGNOSIS — Z8709 Personal history of other diseases of the respiratory system: Secondary | ICD-10-CM | POA: Diagnosis not present

## 2020-07-08 DIAGNOSIS — R0981 Nasal congestion: Secondary | ICD-10-CM | POA: Diagnosis not present

## 2020-07-08 DIAGNOSIS — S0992XS Unspecified injury of nose, sequela: Secondary | ICD-10-CM | POA: Diagnosis not present

## 2020-07-08 DIAGNOSIS — J3489 Other specified disorders of nose and nasal sinuses: Secondary | ICD-10-CM | POA: Diagnosis not present

## 2020-07-08 DIAGNOSIS — Z87898 Personal history of other specified conditions: Secondary | ICD-10-CM | POA: Diagnosis not present

## 2020-07-08 DIAGNOSIS — G44229 Chronic tension-type headache, not intractable: Secondary | ICD-10-CM | POA: Diagnosis not present

## 2020-07-08 DIAGNOSIS — R519 Headache, unspecified: Secondary | ICD-10-CM | POA: Diagnosis not present

## 2020-08-09 DIAGNOSIS — F419 Anxiety disorder, unspecified: Secondary | ICD-10-CM | POA: Diagnosis not present

## 2020-08-09 DIAGNOSIS — M79644 Pain in right finger(s): Secondary | ICD-10-CM | POA: Diagnosis not present

## 2020-08-09 DIAGNOSIS — J309 Allergic rhinitis, unspecified: Secondary | ICD-10-CM | POA: Diagnosis not present

## 2020-08-09 DIAGNOSIS — E039 Hypothyroidism, unspecified: Secondary | ICD-10-CM | POA: Diagnosis not present

## 2020-08-09 DIAGNOSIS — E785 Hyperlipidemia, unspecified: Secondary | ICD-10-CM | POA: Diagnosis not present

## 2020-08-09 DIAGNOSIS — Z125 Encounter for screening for malignant neoplasm of prostate: Secondary | ICD-10-CM | POA: Diagnosis not present

## 2020-08-09 DIAGNOSIS — E559 Vitamin D deficiency, unspecified: Secondary | ICD-10-CM | POA: Diagnosis not present

## 2020-10-23 DIAGNOSIS — F419 Anxiety disorder, unspecified: Secondary | ICD-10-CM | POA: Diagnosis not present

## 2020-10-23 DIAGNOSIS — Z125 Encounter for screening for malignant neoplasm of prostate: Secondary | ICD-10-CM | POA: Diagnosis not present

## 2020-10-23 DIAGNOSIS — E039 Hypothyroidism, unspecified: Secondary | ICD-10-CM | POA: Diagnosis not present

## 2020-10-23 DIAGNOSIS — E559 Vitamin D deficiency, unspecified: Secondary | ICD-10-CM | POA: Diagnosis not present

## 2020-10-23 DIAGNOSIS — J309 Allergic rhinitis, unspecified: Secondary | ICD-10-CM | POA: Diagnosis not present

## 2020-10-23 DIAGNOSIS — U071 COVID-19: Secondary | ICD-10-CM | POA: Diagnosis not present

## 2020-10-23 DIAGNOSIS — E785 Hyperlipidemia, unspecified: Secondary | ICD-10-CM | POA: Diagnosis not present

## 2020-10-23 DIAGNOSIS — Z20828 Contact with and (suspected) exposure to other viral communicable diseases: Secondary | ICD-10-CM | POA: Diagnosis not present

## 2020-11-25 DIAGNOSIS — E785 Hyperlipidemia, unspecified: Secondary | ICD-10-CM | POA: Diagnosis not present

## 2020-11-25 DIAGNOSIS — M25571 Pain in right ankle and joints of right foot: Secondary | ICD-10-CM | POA: Diagnosis not present

## 2020-11-25 DIAGNOSIS — I10 Essential (primary) hypertension: Secondary | ICD-10-CM | POA: Diagnosis not present

## 2020-11-25 DIAGNOSIS — E039 Hypothyroidism, unspecified: Secondary | ICD-10-CM | POA: Diagnosis not present

## 2020-11-25 DIAGNOSIS — J329 Chronic sinusitis, unspecified: Secondary | ICD-10-CM | POA: Diagnosis not present

## 2020-11-25 DIAGNOSIS — M25572 Pain in left ankle and joints of left foot: Secondary | ICD-10-CM | POA: Diagnosis not present

## 2021-05-14 ENCOUNTER — Encounter: Payer: Self-pay | Admitting: *Deleted

## 2021-05-14 ENCOUNTER — Encounter: Payer: Self-pay | Admitting: Cardiology

## 2021-05-16 ENCOUNTER — Ambulatory Visit: Payer: PPO | Admitting: Cardiology

## 2021-05-20 DIAGNOSIS — J309 Allergic rhinitis, unspecified: Secondary | ICD-10-CM | POA: Insufficient documentation

## 2021-05-20 DIAGNOSIS — Z955 Presence of coronary angioplasty implant and graft: Secondary | ICD-10-CM | POA: Insufficient documentation

## 2021-05-20 DIAGNOSIS — R21 Rash and other nonspecific skin eruption: Secondary | ICD-10-CM | POA: Insufficient documentation

## 2021-05-20 DIAGNOSIS — R7303 Prediabetes: Secondary | ICD-10-CM | POA: Insufficient documentation

## 2021-05-20 DIAGNOSIS — Z8679 Personal history of other diseases of the circulatory system: Secondary | ICD-10-CM | POA: Insufficient documentation

## 2021-05-20 DIAGNOSIS — K5730 Diverticulosis of large intestine without perforation or abscess without bleeding: Secondary | ICD-10-CM | POA: Insufficient documentation

## 2021-05-20 DIAGNOSIS — S8412XA Injury of peroneal nerve at lower leg level, left leg, initial encounter: Secondary | ICD-10-CM | POA: Insufficient documentation

## 2021-05-20 DIAGNOSIS — Z87442 Personal history of urinary calculi: Secondary | ICD-10-CM | POA: Insufficient documentation

## 2021-05-20 DIAGNOSIS — I700 Atherosclerosis of aorta: Secondary | ICD-10-CM | POA: Insufficient documentation

## 2021-05-20 DIAGNOSIS — E785 Hyperlipidemia, unspecified: Secondary | ICD-10-CM | POA: Insufficient documentation

## 2021-05-20 DIAGNOSIS — F419 Anxiety disorder, unspecified: Secondary | ICD-10-CM | POA: Insufficient documentation

## 2021-05-20 DIAGNOSIS — N3289 Other specified disorders of bladder: Secondary | ICD-10-CM | POA: Insufficient documentation

## 2021-05-20 DIAGNOSIS — Z9189 Other specified personal risk factors, not elsewhere classified: Secondary | ICD-10-CM | POA: Insufficient documentation

## 2021-05-20 DIAGNOSIS — K219 Gastro-esophageal reflux disease without esophagitis: Secondary | ICD-10-CM | POA: Insufficient documentation

## 2021-05-20 DIAGNOSIS — Z860100 Personal history of colon polyps, unspecified: Secondary | ICD-10-CM | POA: Insufficient documentation

## 2021-05-20 DIAGNOSIS — E039 Hypothyroidism, unspecified: Secondary | ICD-10-CM | POA: Insufficient documentation

## 2021-05-20 DIAGNOSIS — F32A Depression, unspecified: Secondary | ICD-10-CM | POA: Insufficient documentation

## 2021-05-20 DIAGNOSIS — Z8711 Personal history of peptic ulcer disease: Secondary | ICD-10-CM | POA: Insufficient documentation

## 2021-05-20 DIAGNOSIS — Z8719 Personal history of other diseases of the digestive system: Secondary | ICD-10-CM | POA: Insufficient documentation

## 2021-05-28 ENCOUNTER — Encounter: Payer: Self-pay | Admitting: Cardiology

## 2021-05-28 ENCOUNTER — Ambulatory Visit (INDEPENDENT_AMBULATORY_CARE_PROVIDER_SITE_OTHER): Payer: PPO | Admitting: Cardiology

## 2021-05-28 VITALS — BP 132/84 | HR 89 | Ht 71.0 in | Wt 197.0 lb

## 2021-05-28 DIAGNOSIS — R079 Chest pain, unspecified: Secondary | ICD-10-CM

## 2021-05-28 DIAGNOSIS — I10 Essential (primary) hypertension: Secondary | ICD-10-CM | POA: Diagnosis not present

## 2021-05-28 DIAGNOSIS — I2510 Atherosclerotic heart disease of native coronary artery without angina pectoris: Secondary | ICD-10-CM | POA: Diagnosis not present

## 2021-05-28 DIAGNOSIS — E785 Hyperlipidemia, unspecified: Secondary | ICD-10-CM | POA: Diagnosis not present

## 2021-05-28 HISTORY — DX: Chest pain, unspecified: R07.9

## 2021-05-28 MED ORDER — NITROGLYCERIN 0.4 MG SL SUBL: 0.4000 mg | SUBLINGUAL_TABLET | SUBLINGUAL | 6 refills | Status: DC | PRN

## 2021-05-28 NOTE — Patient Instructions (Signed)
Medication Instructions:  ?Your physician has recommended you make the following change in your medication:  ? ?Use nitroglycerin 1 tablet placed under the tongue at the first sign of chest pain or an angina attack. 1 tablet may be used every 5 minutes as needed, for up to 15 minutes. Do not take more than 3 tablets in 15 minutes. If pain persist call 911 or go to the nearest ED. ? ? ?*If you need a refill on your cardiac medications before your next appointment, please call your pharmacy* ? ? ?Lab Work: ?Your physician recommends that you have labs done in the office today. Your test included  basic metabolic panel, liver function and lipids. ? ?If you have labs (blood work) drawn today and your tests are completely normal, you will receive your results only by: ?MyChart Message (if you have MyChart) OR ?A paper copy in the mail ?If you have any lab test that is abnormal or we need to change your treatment, we will call you to review the results. ? ? ?Testing/Procedures: ?None ordered ? ? ?Follow-Up: ?At [facility], you and your health needs are our priority.  As part of our continuing mission to provide you with exceptional heart care, we have created designated Provider Care Teams.  These Care Teams include your primary Cardiologist (physician) and Advanced Practice Providers (APPs -  Physician Assistants and Nurse Practitioners) who all work together to provide you with the care you need, when you need it. ? ?We recommend signing up for the patient portal called "MyChart".  Sign up information is provided on this After Visit Summary.  MyChart is used to connect with patients for Virtual Visits (Telemedicine).  Patients are able to view lab/test results, encounter notes, upcoming appointments, etc.  Non-urgent messages can be sent to your provider as well.   ?To learn more about what you can do with MyChart, go to [email or web address].   ? ?Your next appointment:   ?9 month(s) ? ?The format for your next  appointment:   ?In Person ? ?Provider:   ?Jyl Heinz, MD ? ? ?Other Instructions ?NA   ?

## 2021-05-28 NOTE — Addendum Note (Signed)
Addended by: Truddie Hidden on: 05/28/2021 10:11 AM ? ? Modules accepted: Orders ? ?

## 2021-05-28 NOTE — Progress Notes (Signed)
?Cardiology Office Note:   ? ?Date:  05/28/2021  ? ?ID:  Larry Randall, DOB [birth year], MRN [id number] ? ?PCP:  Imagene Riches, NP  ?Cardiologist:  Jenean Lindau, MD  ? ?Referring MD: Imagene Riches, NP  ? ? ?ASSESSMENT:   ? ?1. Coronary artery disease involving native coronary artery of native heart without angina pectoris   ?2. Dyslipidemia, goal LDL below 70   ?3. Chest pain of uncertain etiology   ?4. Essential hypertension   ? ?PLAN:   ? ?In order of problems listed above: ? ?Coronary artery disease: Secondary prevention stressed with the patient.  Importance of compliance with diet medication stressed any vocalized understanding.  Sublingual nitroglycerin prescription was sent, its protocol and 911 protocol explained and the patient vocalized understanding questions were answered to the patient's satisfaction.  His chest pain is atypical but in view of his history we will do a Lexiscan sestamibi. ?He leads a sedentary lifestyle because of orthopedic issues. ?Mixed dyslipidemia: Lipids are markedly elevated and we will recheck them today and adjust medications accordingly.  Diet emphasized. ?Essential hypertension: Blood pressure stable and diet was emphasized and lifestyle modification urged. ?Cardiac murmur: Echocardiogram will be done to assess murmur heard on auscultation. ?Patient will be seen in follow-up appointment in 9 months or earlier if the patient has any concerns ? ? ? ?Medication Adjustments/Labs and Tests Ordered: ?Current medicines are reviewed at length with the patient today.  Concerns regarding medicines are outlined above.  ?Orders Placed This Encounter  ?Procedures  ? Basic metabolic panel  ? Hepatic function panel  ? Lipid panel  ? EKG 12-Lead  ? ?Meds ordered this encounter  ?Medications  ? nitroGLYCERIN (NITROSTAT) 0.4 MG SL tablet  ?  Sig: Place 1 tablet (0.4 mg total) under the tongue every 5 (five) minutes as needed.  ?  Dispense:  25 tablet  ?  Refill:  6  ? ? ? ?History of  Present Illness:   ? ?Larry Randall is a 71 y.o. male who is being seen today for the evaluation of chest pain and coronary artery disease at the request of York, Ronn Melena, NP.  Patient is a pleasant 71 year old male.  He has past medical history of coronary artery disease postcoronary stenting in the remote past.  Details of which are not available to me.  He has history of essential hypertension and dyslipidemia.  He has diet-controlled diabetes mellitus according to the patient.  He leads a sedentary lifestyle.  He complains of chest pain occasionally not related to exertion.  He has multiple orthopedic issues of the neck.  EKG ? ?Past Medical History:  ?Diagnosis Date  ? Allergic rhinitis   ? Anxiety   ? Arthritis   ? LEFT LEG AND ANKLE  ? At risk for sleep apnea   ? STOP-BANG=  6   SENT TO PCP 08-28-2013  ? Atherosclerosis of aorta ([place])   ? Atypical rash OF MOUTH -- PER PT UNKNOWN ETIOLOGY  ? RX MOUTHWASH BID-  PT STATES POSSIBLE FROM GERD  ? Bladder mass   ? BLADDER NECK AREA  ? Borderline diabetes   ? Chronic pain due to trauma 03/29/2012  ? Coronary artery disease involving native coronary artery of native heart without angina pectoris 07/25/2015  ? Depression   ? Dyslipidemia, goal LDL below 70 07/25/2015  ? Essential hypertension 07/25/2015  ? GERD (gastroesophageal reflux disease)   ? H/O hiatal hernia   ? Hemorrhagic cystitis 08/14/2013  ?  History of CHF (congestive heart failure)   ? History of colon polyps   ? 2013  ? History of gastric ulcer   ? History of kidney stones   ? History of urinary retention   ? Hx of myocardial infarction 07/25/2015  ? Hyperlipemia   ? Hypertrophy of prostate with urinary obstruction and other lower urinary tract symptoms (LUTS) 08/31/2013  ? Hypothyroidism   ? 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 [place]  ? Myocardial infarction ([place]) 03/09/2016  ? 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 Preoperative cardiovascular  examination 07/25/2015  ? RSD lower limb 03/29/2012  ? S/P primary angioplasty with coronary stent X3 STENTS  1996  POST MI  ? PER PT BARE METAL STENTS  ? Sigmoid diverticulosis   ? Trigeminal neuralgia 05/26/2013  ? Formatting of this note might be different from the original. IMPRESSION: V3  ? ? ?Past Surgical History:  ?Procedure Laterality Date  ? COLONOSCOPY WITH ESOPHAGOGASTRODUODENOSCOPY (EGD)  10-29-2011  ? CORONARY ANGIOPLASTY WITH STENT PLACEMENT  1996  ? PER PT X3 STENTS (BARE METAL)  ? CYSTOSCOPY W/ RETROGRADES N/A 08/31/2013  ? Procedure: CYSTOSCOPY WITH bilateral RETROGRADE PYELOGRAM;  Surgeon: Jorja Loa, MD;  [facility];  Service: Urology;  Laterality: N/A;  ? EXTERNAL FIXATION LEG  11/09/2011  ? Procedure: EXTERNAL FIXATION LEG;  Surgeon: Wylene Simmer, MD;  Location: [place];  Service: Orthopedics;  Laterality: Left;  ? EXTRACORPOREAL SHOCK WAVE LITHOTRIPSY  05-02-2009  ? HERNIA REPAIR    ? INGUINAL HERNIA REPAIR Bilateral 04/16/2015  ? Procedure: LAPAROSCOPIC BILATERAL INGUINAL HERNIA REPAIR;  Surgeon: Michael Boston, MD;  Location: [facility];  Service: General;  Laterality: Bilateral;  ? INSERTION OF MESH Bilateral 04/16/2015  ? Procedure: INSERTION OF MESH;  Surgeon: Michael Boston, MD;  Location: [facility];  Service: General;  Laterality: Bilateral;  ? ORIF CALCANEOUS FRACTURE  11/12/2011  ? Procedure: OPEN REDUCTION INTERNAL FIXATION (ORIF) CALCANEOUS FRACTURE;  Surgeon: Wylene Simmer, MD;  Location: [place];  Service: Orthopedics;  Laterality: Right;  ORIF RIGHT CALCANEOUS FRACTURE  ? ORIF TIBIA FRACTURE  11/17/2011  ? Procedure: OPEN REDUCTION INTERNAL FIXATION (ORIF) TIBIA FRACTURE;  Surgeon: Wylene Simmer, MD;  Location: [place];  Service: Orthopedics;  Laterality: Left;  REMOVAL OF EXFIX, ORIF LEFT TIBIAL PILON FRACTURE  ? TONSILLECTOMY AND ADENOIDECTOMY  CHILD  ? TRANSURETHRAL RESECTION OF BLADDER TUMOR WITH GYRUS (TURBT-GYRUS) N/A 08/31/2013  ? Procedure: TRANSURETHRAL RESECTION OF  BLADDER TUMOR WITH GYRUS;  Surgeon: Jorja Loa, MD;  Location: [facility];  Service: Urology;  Laterality: N/A;  ? TRANSURETHRAL RESECTION OF PROSTATE  X2   YRS AGO  ? BEFORE 1996  ? TRANSURETHRAL RESECTION OF PROSTATE  09/07/2011  ? Procedure: TRANSURETHRAL RESECTION OF THE PROSTATE WITH GYRUS INSTRUMENTS;  Surgeon: Franchot Gallo, MD;  Location[facility];  Service: Urology;  Laterality: N/A;  ? TRANSURETHRAL RESECTION OF PROSTATE N/A 08/31/2013  ? Procedure: TRANSURETHRAL RESECTION OF THE PROSTATE WITH GYRUS ;  Surgeon: Jorja Loa, MD;  Location[facility];  Service: Urology;  Laterality: N/A;  ? UMBILICAL HERNIA REPAIR N/A 04/16/2015  ? Procedure: LAPAROSCOPIC UMBILICAL HERNIA;  Surgeon: Michael Boston, MD;  Location: [facility];  Service: General;  Laterality: N/A;  ? ? ?Current Medications: ?Current Meds  ?Medication Sig  ? aspirin EC 81 MG tablet Take 81 mg by mouth daily. Swallow whole.  ? citalopram (CELEXA) 40  MG tablet Take 40 mg by mouth daily.  ? fluticasone (FLONASE) 50 MCG/ACT nasal spray Place 2 sprays into both nostrils daily.   ? losartan (COZAAR) 100 MG tablet Take 100 mg by mouth every morning.  ? nitroGLYCERIN (NITROSTAT) 0.4 MG SL tablet Place 1 tablet (0.4 mg total) under the tongue every 5 (five) minutes as needed.  ? pantoprazole (PROTONIX) 40 MG tablet Take 40 mg by mouth daily.  ? rosuvastatin (CRESTOR) 40 MG tablet Take 40 mg by mouth daily.  ? thyroid (ARMOUR) 60 MG tablet Take 60 mg by mouth daily before breakfast.   ?  ? ?Allergies:   Patient has no known allergies.  ? ?Social History  ? ?Socioeconomic History  ? Marital status: Single  ?  Spouse name: Not on file  ? Number of children: 3  ? Years of education: Not on file  ? Highest education level: Not on file  ?Occupational History  ? Not on file  ?Tobacco Use  ? Smoking status: Former  ?  Packs/day: 1.00  ?  Years: 30.00  ?  Pack years: 30.00  ?  Types: Cigarettes  ?   Quit date: 09/02/1994  ?  Years since quitting: 26.7  ? Smokeless tobacco: Never  ?Substance and Sexual Activity  ? Alcohol use: No  ? Drug use: No  ? Sexual activity: Not on file  ?Other Topics Concern  ? Not on fil

## 2021-05-29 ENCOUNTER — Telehealth (HOSPITAL_COMMUNITY): Payer: Self-pay | Admitting: *Deleted

## 2021-05-29 LAB — HEPATIC FUNCTION PANEL
ALT: 23 IU/L (ref 0–44)
AST: 30 IU/L (ref 0–40)
Albumin: 4.4 g/dL (ref 3.7–4.7)
Alkaline Phosphatase: 113 IU/L (ref 44–121)
Bilirubin Total: 0.7 mg/dL (ref 0.0–1.2)
Bilirubin, Direct: 0.25 mg/dL (ref 0.00–0.40)
Total Protein: 6.6 g/dL (ref 6.0–8.5)

## 2021-05-29 LAB — BASIC METABOLIC PANEL
BUN/Creatinine Ratio: 12 (ref 10–24)
BUN: 14 mg/dL (ref 8–27)
CO2: 26 mmol/L (ref 20–29)
Calcium: 9.7 mg/dL (ref 8.6–10.2)
Chloride: 103 mmol/L (ref 96–106)
Creatinine, Ser: 1.21 mg/dL (ref 0.76–1.27)
Glucose: 134 mg/dL — ABNORMAL HIGH (ref 70–99)
Potassium: 4.4 mmol/L (ref 3.5–5.2)
Sodium: 142 mmol/L (ref 134–144)
eGFR: 64 mL/min/{1.73_m2} (ref >59)

## 2021-05-29 LAB — LIPID PANEL
Chol/HDL Ratio: 4.1 ratio (ref 0.0–5.0)
Cholesterol, Total: 146 mg/dL (ref 100–199)
HDL: 36 mg/dL — ABNORMAL LOW (ref >39)
LDL Chol Calc (NIH): 93 mg/dL (ref 0–99)
Triglycerides: 91 mg/dL (ref 0–149)
VLDL Cholesterol Cal: 17 mg/dL (ref 5–40)

## 2021-05-29 NOTE — Telephone Encounter (Signed)
Patient given detailed instructions per Myocardial Perfusion Study Information Sheet for the test on 06/05/2021 at 8:15. Patient notified to arrive 15 minutes early and that it is imperative to arrive on time for appointment to keep from having the test rescheduled. ? If you need to cancel or reschedule your appointment, please call the office within 24 hours of your appointment. . Patient verbalized understanding.Larry Randall ? ? ?

## 2021-05-30 ENCOUNTER — Telehealth: Payer: Self-pay | Admitting: Cardiology

## 2021-05-30 NOTE — Telephone Encounter (Signed)
Left vm for pt to callback 

## 2021-05-30 NOTE — Telephone Encounter (Signed)
Pt returning all regarding test results. Please advise ?

## 2021-06-03 NOTE — Telephone Encounter (Signed)
See phone note, ?

## 2021-06-05 ENCOUNTER — Ambulatory Visit (INDEPENDENT_AMBULATORY_CARE_PROVIDER_SITE_OTHER): Payer: PPO

## 2021-06-05 DIAGNOSIS — I2510 Atherosclerotic heart disease of native coronary artery without angina pectoris: Secondary | ICD-10-CM

## 2021-06-05 DIAGNOSIS — R079 Chest pain, unspecified: Secondary | ICD-10-CM

## 2021-06-05 LAB — ECHOCARDIOGRAM COMPLETE
Area-P 1/2: 2.22 cm2
Height: 71 in
S' Lateral: 3 cm
Weight: 3152 oz

## 2021-06-05 MED ORDER — TECHNETIUM TC 99M TETROFOSMIN IV KIT
10.8000 | PACK | Freq: Once | INTRAVENOUS | Status: AC | PRN
Start: 2021-06-05 — End: 2021-06-05
  Administered 2021-06-05: 10.8 via INTRAVENOUS

## 2021-06-05 MED ORDER — TECHNETIUM TC 99M TETROFOSMIN IV KIT
29.5000 | PACK | Freq: Once | INTRAVENOUS | Status: AC | PRN
Start: 2021-06-05 — End: 2021-06-05
  Administered 2021-06-05: 29.5 via INTRAVENOUS

## 2021-06-05 MED ORDER — REGADENOSON 0.4 MG/5ML IV SOLN
0.4000 mg | Freq: Once | INTRAVENOUS | Status: AC
  Administered 2021-06-05: 0.4 mg via INTRAVENOUS

## 2021-06-06 ENCOUNTER — Telehealth: Payer: Self-pay

## 2021-06-06 LAB — MYOCARDIAL PERFUSION IMAGING
LV dias vol: 84 mL (ref 62–150)
LV sys vol: 33 mL
Nuc Stress EF: 60 %
Peak HR: 96 {beats}/min
Rest HR: 78 {beats}/min
Rest Nuclear Isotope Dose: 10.8 mCi
SDS: 6
SRS: 5
SSS: 11
ST Depression (mm): 0 mm
Stress Nuclear Isotope Dose: 29.5 mCi
TID: 0.78

## 2021-06-06 NOTE — Telephone Encounter (Signed)
Patient notified of results, results forward to PCP.  

## 2021-06-06 NOTE — Telephone Encounter (Signed)
-----   Message from Jenean Lindau, MD sent at 06/06/2021  2:45 PM EDT ----- ?The results of the study is unremarkable. Please inform patient. I will discuss in detail at next appointment. Cc  primary care/referring physician ?Jenean Lindau, MD 06/06/2021 2:45 PM  ?

## 2021-10-02 ENCOUNTER — Encounter: Payer: Self-pay | Admitting: Internal Medicine

## 2021-10-08 ENCOUNTER — Encounter: Payer: Self-pay | Admitting: Internal Medicine

## 2021-11-18 ENCOUNTER — Encounter: Payer: Self-pay | Admitting: Internal Medicine

## 2021-11-18 ENCOUNTER — Ambulatory Visit: Payer: PPO | Admitting: Internal Medicine

## 2021-11-18 VITALS — BP 120/78 | HR 112 | Ht 67.25 in | Wt 191.2 lb

## 2021-11-18 DIAGNOSIS — R1310 Dysphagia, unspecified: Secondary | ICD-10-CM | POA: Diagnosis not present

## 2021-11-18 DIAGNOSIS — K219 Gastro-esophageal reflux disease without esophagitis: Secondary | ICD-10-CM

## 2021-11-18 DIAGNOSIS — I714 Abdominal aortic aneurysm, without rupture, unspecified: Secondary | ICD-10-CM

## 2021-11-18 NOTE — Patient Instructions (Signed)
_______________________________________________________  If you are age 73 or older, your body mass index should be between 23-30. Your Body mass index is 29.73 kg/m. If this is out of the aforementioned range listed, please consider follow up with your Primary Care Provider.  If you are age 33 or younger, your body mass index should be between 19-25. Your Body mass index is 29.73 kg/m. If this is out of the aformentioned range listed, please consider follow up with your Primary Care Provider.   ________________________________________________________  The  GI providers would like to encourage you to use [facility] to communicate with providers for non-urgent requests or questions.  Due to long hold times on the telephone, sending your provider a message by [facility] may be a faster and more efficient way to get a response.  Please allow 48 business hours for a response.  Please remember that this is for non-urgent requests.  _______________________________________________________  Call us back as soon as you are ready to schedule your endoscopy

## 2021-11-18 NOTE — Progress Notes (Addendum)
HISTORY OF PRESENT ILLNESS:   Larry Randall is a 71 y.o. male, with multiple medical problems as listed below, who is self-referred regarding problems with dysphagia and interested in follow-up endoscopic procedures.  I saw the patient in the office last February 01, 2012.  At that time he was felt to have chronic functional abdominal pain, GERD, chronic nausea, and increased intestinal gas.  See that dictation.  He subsequently underwent colonoscopy and upper endoscopy October 29, 2011.  Complete colonoscopy revealed diverticulosis and a diminutive colon polyp.  Follow-up in 10 years recommended.  Upper endoscopy revealed no abnormalities.  He was empirically dilated with 54 French Maloney dilator.  He has not been seen since.   Patient tells me that he has had recurrent problems with dysphagia.  He describes discomfort associated with this.  Dysphagia is typically solids.  He also has a lot of problems with cervical neck pain.  He also describes burning sensations when he breathes in.  They chest complaints for which she had a cardiology evaluation.  He also has some urologic issues and tells me he has a abdominal aortic aneurysm though I do not see relevant abnormal studies.  He is a poor historian.  He cannot remember his medications.  He seems depressed.   Blood work from May 28, 2021 shows normal comprehensive metabolic panel except for glucose 134.  He does not take PPI, though his medication list states pantoprazole.   REVIEW OF SYSTEMS:   All non-GI ROS negative unless otherwise stated in the HPI except for sleeping problems, excessive thirst, depression, cough, itching, back pain, arthritis, anxiety, sinus and allergy trouble       Past Medical History:  Diagnosis Date   AAA (abdominal aortic aneurysm) (HCC)     Allergic rhinitis     Anxiety     Arthritis      LEFT LEG AND ANKLE   At risk for sleep apnea      STOP-BANG=  6   SENT TO PCP 08-28-2013   Atherosclerosis of aorta  (H[facility])     Atypical rash OF MOUTH -- PER PT UNKNOWN ETIOLOGY    RX MOUTHWASH BID-  PT STATES POSSIBLE FROM GERD   Bladder mass      BLADDER NECK AREA   Borderline diabetes     Chronic pain due to trauma 03/29/2012   Coronary artery disease involving native coronary artery of native heart without angina pectoris 07/25/2015   Depression     Dyslipidemia, goal LDL below 70 07/25/2015   Essential hypertension 07/25/2015   GERD (gastroesophageal reflux disease)     H/O hiatal hernia     Hemorrhagic cystitis 08/14/2013   History of CHF (congestive heart failure)     History of colon polyps      2013   History of gastric ulcer     History of kidney stones     History of urinary retention     Hx of myocardial infarction 07/25/2015   Hyperlipemia     Hypertrophy of prostate with urinary obstruction and other lower urinary tract symptoms (LUTS) 08/31/2013   Hypothyroidism     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 IMPRESSION: V3           Past Surgical History:  Procedure Laterality Date   COLONOSCOPY WITH ESOPHAGOGASTRODUODENOSCOPY (EGD)   10-29-2011   CORONARY ANGIOPLASTY WITH STENT PLACEMENT   1996    PER PT X3 STENTS (BARE METAL)   CYSTOSCOPY W/ RETROGRADES N/A 08/31/2013    Procedure: CYSTOSCOPY WITH bilateral RETROGRADE PYELOGRAM;  Surgeon: Stephen M Dahlstedt, MD;  Location: [facility];  Service: Urology;  Laterality: N/A;   EXTERNAL FIXATION LEG   11/09/2011    Procedure: EXTERNAL FIXATION LEG;  Surgeon: Christopher Glasscock Hewitt, MD;  Location:  [facility];  Service: Orthopedics;  Laterality: Left;   EXTRACORPOREAL SHOCK WAVE LITHOTRIPSY   05-02-2009   HERNIA REPAIR       INGUINAL HERNIA REPAIR Bilateral 04/16/2015    Procedure: LAPAROSCOPIC BILATERAL INGUINAL HERNIA REPAIR;  Surgeon: Steven Gross, MD;  Location: [facility];  Service: General;  Laterality: Bilateral;   INSERTION OF MESH Bilateral 04/16/2015    Procedure: INSERTION OF MESH;  Surgeon: Steven Gross, MD;  Location[facility];  Service: General;  Laterality: Bilateral;   ORIF CALCANEOUS FRACTURE   11/12/2011    Procedure: OPEN REDUCTION INTERNAL FIXATION (ORIF) CALCANEOUS FRACTURE;  Surgeon: Jsean Taussig Hewitt, MD;  Location: [facility];  Service: Orthopedics;  Laterality: Right;  ORIF RIGHT CALCANEOUS FRACTURE   ORIF TIBIA FRACTURE   11/17/2011    Procedure: OPEN REDUCTION INTERNAL FIXATION (ORIF) TIBIA FRACTURE;  Surgeon: Marlenne Ridge Hewitt, MD;  Location: [facility];  Service: Orthopedics;  Laterality: Left;  REMOVAL OF EXFIX, ORIF LEFT TIBIAL PILON FRACTURE   TONSILLECTOMY AND ADENOIDECTOMY   CHILD   TRANSURETHRAL RESECTION OF BLADDER TUMOR WITH GYRUS (TURBT-GYRUS) N/A 08/31/2013    Procedure: TRANSURETHRAL RESECTION OF BLADDER TUMOR WITH GYRUS;  Surgeon: Stephen M Dahlstedt, MD;  Location: [facility];  Service: Urology;  Laterality: N/A;   TRANSURETHRAL RESECTION OF PROSTATE   X2   YRS AGO    BEFORE 1996   TRANSURETHRAL RESECTION OF PROSTATE   09/07/2011    Procedure: TRANSURETHRAL RESECTION OF THE PROSTATE WITH GYRUS INSTRUMENTS;  Surgeon: Stephen Dahlstedt, MD;  Location: [facility];  Service: Urology;  Laterality: N/A;   TRANSURETHRAL RESECTION OF PROSTATE N/A 08/31/2013    Procedure: TRANSURETHRAL RESECTION OF THE PROSTATE WITH GYRUS ;  Surgeon: Stephen M Dahlstedt, MD;  Location: [facility];  Service: Urology;  Laterality: N/A;   UMBILICAL HERNIA REPAIR N/A 04/16/2015    Procedure: LAPAROSCOPIC UMBILICAL HERNIA;  Surgeon: Steven Gross, MD;  Location: [facility];   Service: General;  Laterality: N/A;      Social History Torrence S Hudlow  reports that he quit smoking about 27 years ago. His smoking use included cigarettes. He has a 30.00 pack-year smoking history. He has never used smokeless tobacco. He reports that he does not drink alcohol and does not use drugs.   family history includes Heart disease in his father; Hypertension in his father.   No Known Allergies       PHYSICAL EXAMINATION: Vital signs: BP 120/78 (BP Location: Left Arm, Patient Position: Sitting, Cuff Size: Normal)   Pulse (!) 112 Comment: irregular  Ht 5' 7.25" (1.708 m) Comment: height measured without shoes  Wt 191 lb 4 oz (86.8 kg)   BMI 29.73 kg/m   Constitutional: generally   well-appearing, no acute distress Psychiatric: alert and oriented x3, cooperative.  Appears depressed Eyes: extraocular movements intact, anicteric, conjunctiva pink Mouth: oral pharynx moist, no lesions Neck: supple no lymphadenopathy Cardiovascular: heart regular rate and rhythm, no murmur Lungs: clear to auscultation bilaterally Abdomen: soft, nontender, nondistended, no obvious ascites, no peritoneal signs, normal bowel sounds, no organomegaly.  No obvious bruit Rectal: Omitted Extremities: no, cyanosis, or lower extremity edema bilaterally Skin: no lesions on visible extremities Neuro: No focal deficits.  Cranial nerves intact   ASSESSMENT:   1.  Dysphagia.  Somewhat vague.  Previous EGD for the same 10 years ago was normal.  He was dilated.  He did feel that this may have helped. 2.  Possible GERD 3.  Neck pain at other non-GI somatic complaints 4.  Question abdominal aortic aneurysm 5.  Colonoscopy 10 years ago as described     PLAN:   1.  Upper endoscopy with possible esophageal dilation.The nature of the procedure, as well as the risks, benefits, and alternatives were carefully and thoroughly reviewed with the patient. Ample time for discussion and questions allowed. The patient  understood, was satisfied, and agreed to proceed.  2.  Consider PPI 3.  Obtain outside imaging to confirm abdominal aortic aneurysm, and if so--it's size. A total time of 60 minutes was spent preparing to see the patient, obtaining history, performing medically appropriate physical exam, counseling and educating the patient regarding the above listed issues, ordering therapeutic endoscopic procedure, and documenting clinical information in the health record.   ADDENDUM: Ultrasound report dated March 04, 2021 obtained and reviewed.  Distal abdominal aortic aneurysm measuring 4.2 cm in maximal diameter.     

## 2021-11-27 ENCOUNTER — Encounter: Payer: Self-pay | Admitting: Internal Medicine

## 2021-12-11 ENCOUNTER — Ambulatory Visit (AMBULATORY_SURGERY_CENTER): Payer: Self-pay | Admitting: *Deleted

## 2021-12-11 VITALS — Ht 67.25 in | Wt 189.2 lb

## 2021-12-11 DIAGNOSIS — R1310 Dysphagia, unspecified: Secondary | ICD-10-CM

## 2021-12-11 NOTE — Progress Notes (Signed)

## 2022-01-01 ENCOUNTER — Encounter: Payer: Self-pay | Admitting: Internal Medicine

## 2022-01-12 ENCOUNTER — Ambulatory Visit (AMBULATORY_SURGERY_CENTER): Payer: PPO | Admitting: Internal Medicine

## 2022-01-12 ENCOUNTER — Encounter: Payer: Self-pay | Admitting: Internal Medicine

## 2022-01-12 VITALS — BP 156/92 | HR 72 | Temp 98.1°F | Resp 12 | Ht 67.25 in | Wt 189.2 lb

## 2022-01-12 DIAGNOSIS — K319 Disease of stomach and duodenum, unspecified: Secondary | ICD-10-CM | POA: Diagnosis not present

## 2022-01-12 DIAGNOSIS — R1310 Dysphagia, unspecified: Secondary | ICD-10-CM | POA: Diagnosis not present

## 2022-01-12 DIAGNOSIS — K219 Gastro-esophageal reflux disease without esophagitis: Secondary | ICD-10-CM

## 2022-01-12 DIAGNOSIS — K222 Esophageal obstruction: Secondary | ICD-10-CM

## 2022-01-12 MED ORDER — PANTOPRAZOLE SODIUM 40 MG PO TBEC: 40.0000 mg | DELAYED_RELEASE_TABLET | Freq: Every day | ORAL | 11 refills | Status: DC

## 2022-01-12 MED ORDER — SODIUM CHLORIDE 0.9 % IV SOLN: 500.0000 mL | Freq: Once | INTRAVENOUS | Status: DC

## 2022-01-12 NOTE — Patient Instructions (Signed)
YOU HAD AN ENDOSCOPIC PROCEDURE TODAY AT [facility]:   Refer to the procedure report that was given to you for any specific questions about what was found during the examination.  If the procedure report does not answer your questions, please call your gastroenterologist to clarify.  If you requested that your care partner not be given the details of your procedure findings, then the procedure report has been included in a sealed envelope for you to review at your convenience later.  YOU SHOULD EXPECT: Some feelings of bloating in the abdomen. Passage of more gas than usual.  Walking can help get rid of the air that was put into your GI tract during the procedure and reduce the bloating. If you had a lower endoscopy (such as a colonoscopy or flexible sigmoidoscopy) you may notice spotting of blood in your stool or on the toilet paper. If you underwent a bowel prep for your procedure, you may not have a normal bowel movement for a few days.  Please Note:  You might notice some irritation and congestion in your nose or some drainage.  This is from the oxygen used during your procedure.  There is no need for concern and it should clear up in a day or so.  SYMPTOMS TO REPORT IMMEDIATELY:  Following upper endoscopy (EGD)  Vomiting of blood or coffee ground material  New chest pain or pain under the shoulder blades  Painful or persistently difficult swallowing  New shortness of breath  Fever of 100F or higher  Black, tarry-looking stools  For urgent or emergent issues, a gastroenterologist can be reached at any hour by calling [phone]. Do not use MyChart messaging for urgent concerns.    DIET:  Dilation diet today (see handout).  Drink plenty of fluids but you should avoid alcoholic beverages for 24 hours.  ACTIVITY:  You should plan to take it easy for the rest of today and you should NOT DRIVE or use heavy machinery until tomorrow (because of the sedation medicines used  during the test).    FOLLOW UP: Our staff will call the number listed on your records the next business day following your procedure.  We will call around 7:15- 8:00 am to check on you and address any questions or concerns that you may have regarding the information given to you following your procedure. If we do not reach you, we will leave a message.     If any biopsies were taken you will be contacted by phone or by letter within the next 1-3 weeks.  Please call us at [phone] if you have not heard about the biopsies in 3 weeks.    SIGNATURES/CONFIDENTIALITY: You and/or your care partner have signed paperwork which will be entered into your electronic medical record.  These signatures attest to the fact that that the information above on your After Visit Summary has been reviewed and is understood.  Full responsibility of the confidentiality of this discharge information lies with you and/or your care-partner.

## 2022-01-12 NOTE — Progress Notes (Signed)
A/ox3, pleased with MAC, report to RN 

## 2022-01-12 NOTE — Progress Notes (Signed)
Called to room to assist during endoscopic procedure.  Patient ID and intended procedure confirmed with present staff. Received instructions for my participation in the procedure from the performing physician.  

## 2022-01-12 NOTE — Op Note (Signed)
[facility] Patient Name: Larry Randall Procedure Date: 01/12/2022 2:58 PM MRN: [id number] Endoscopist: Docia Chuck. Henrene Pastor , MD, [id number] Age: 72 Referring MD:  Date of Birth: Jan 10, 1951 Gender: Male Account #: [id number] Procedure:                Upper GI endoscopy with biopsies; Maloney dilation                            of the esophagus. 32 French Indications:              Dysphagia, Suspected esophageal reflux Medicines:                Monitored Anesthesia Care Procedure:                Pre-Anesthesia Assessment:                           - Prior to the procedure, a History and Physical                            was performed, and patient medications and                            allergies were reviewed. The patient's tolerance of                            previous anesthesia was also reviewed. The risks                            and benefits of the procedure and the sedation                            options and risks were discussed with the patient.                            All questions were answered, and informed consent                            was obtained. Prior Anticoagulants: The patient has                            taken no anticoagulant or antiplatelet agents. ASA                            Grade Assessment: II - A patient with mild systemic                            disease. After reviewing the risks and benefits,                            the patient was deemed in satisfactory condition to                            undergo the procedure.  After obtaining informed consent, the endoscope was                            passed under direct vision. Throughout the                            procedure, the patient's blood pressure, pulse, and                            oxygen saturations were monitored continuously. The                            GIF [id number] #[id number] was introduced through the                            mouth,  and advanced to the second part of duodenum.                            The upper GI endoscopy was accomplished without                            difficulty. The patient tolerated the procedure                            well. Scope In: Scope Out: Findings:                 One benign-appearing, intrinsic moderate stenosis                            was found 40 cm from the incisors. This stenosis                            measured 1.5 cm (inner diameter). The scope was                            withdrawn. Dilation was performed with a Maloney                            dilator with no resistance at 89 Fr.                           The exam of the esophagus also revealed esophagitis                            at the gastroesophageal junction as manifested by                            edema and friability.                           The stomach revealed small hiatal hernia. In                            addition, several superficial antral  erosions..                            Biopsies were taken with a cold forceps for                            histology.                           The examined duodenum was normal.                           The cardia and gastric fundus were normal on                            retroflexion. Complications:            No immediate complications. Estimated Blood Loss:     Estimated blood loss: none. Impression:               1. GERD complicated by esophagitis and peptic                            stricture status post dilation                           2. Antral erosions status post biopsies                           3. Otherwise unremarkable EGD. Recommendation:           - Patient has a contact number available for                            emergencies. The signs and symptoms of potential                            delayed complications were discussed with the                            patient. Return to normal activities tomorrow.                             Written discharge instructions were provided to the                            patient.                           - Post dilation diet.                           - Continue present medications.                           - Await pathology results.                           - Prescribe PANTOPRAZOLE 40  mg daily; #30; 11                            refills. This will help heal inflammation and                            protect your esophagus against scarring and                            narrowing                           - Return to the care of your primary provider                            regarding neck pain and other nongastrointestinal                            complaints Albertia Carvin N. Henrene Pastor, MD 01/12/2022 3:16:07 PM This report has been signed electronically.

## 2022-01-12 NOTE — Progress Notes (Signed)
HISTORY OF PRESENT ILLNESS:   Larry Randall is a 71 y.o. male, with multiple medical problems as listed below, who is self-referred regarding problems with dysphagia and interested in follow-up endoscopic procedures.  I saw the patient in the office last February 01, 2012.  At that time he was felt to have chronic functional abdominal pain, GERD, chronic nausea, and increased intestinal gas.  See that dictation.  He subsequently underwent colonoscopy and upper endoscopy October 29, 2011.  Complete colonoscopy revealed diverticulosis and a diminutive colon polyp.  Follow-up in 10 years recommended.  Upper endoscopy revealed no abnormalities.  He was empirically dilated with 54 [place] Maloney dilator.  He has not been seen since.   Patient tells me that he has had recurrent problems with dysphagia.  He describes discomfort associated with this.  Dysphagia is typically solids.  He also has a lot of problems with cervical neck pain.  He also describes burning sensations when he breathes in.  They chest complaints for which she had a cardiology evaluation.  He also has some urologic issues and tells me he has a abdominal aortic aneurysm though I do not see relevant abnormal studies.  He is a poor historian.  He cannot remember his medications.  He seems depressed.   Blood work from May 28, 2021 shows normal comprehensive metabolic panel except for glucose 134.  He does not take PPI, though his medication list states pantoprazole.   REVIEW OF SYSTEMS:   All non-GI ROS negative unless otherwise stated in the HPI except for sleeping problems, excessive thirst, depression, cough, itching, back pain, arthritis, anxiety, sinus and allergy trouble       Past Medical History:  Diagnosis Date   AAA (abdominal aortic aneurysm) (HCC)     Allergic rhinitis     Anxiety     Arthritis      LEFT LEG AND ANKLE   At risk for sleep apnea      STOP-BANG=  6   SENT TO PCP 08-28-2013   Atherosclerosis of aorta  (HCC)     Atypical rash OF MOUTH -- PER PT UNKNOWN ETIOLOGY    RX MOUTHWASH BID-  PT STATES POSSIBLE FROM GERD   Bladder mass      BLADDER NECK AREA   Borderline diabetes     Chronic pain due to trauma 03/29/2012   Coronary artery disease involving native coronary artery of native heart without angina pectoris 07/25/2015   Depression     Dyslipidemia, goal LDL below 70 07/25/2015   Essential hypertension 07/25/2015   GERD (gastroesophageal reflux disease)     H/O hiatal hernia     Hemorrhagic cystitis 08/14/2013   History of CHF (congestive heart failure)     History of colon polyps      2013   History of gastric ulcer     History of kidney stones     History of urinary retention     Hx of myocardial infarction 07/25/2015   Hyperlipemia     Hypertrophy of prostate with urinary obstruction and other lower urinary tract symptoms (LUTS) 08/31/2013   Hypothyroidism     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 IMPRESSION: V3           Past Surgical History:  Procedure Laterality Date   COLONOSCOPY WITH ESOPHAGOGASTRODUODENOSCOPY (EGD)   10-29-2011   CORONARY ANGIOPLASTY WITH STENT PLACEMENT   1996    PER PT X3 STENTS (BARE METAL)   CYSTOSCOPY W/ RETROGRADES N/A 08/31/2013    Procedure: CYSTOSCOPY WITH bilateral RETROGRADE PYELOGRAM;  Surgeon: Jorja Loa, MD;  Location: [facility];  Service: Urology;  Laterality: N/A;   EXTERNAL FIXATION LEG   11/09/2011    Procedure: EXTERNAL FIXATION LEG;  Surgeon: Wylene Simmer, MD;  Location:  [place];  Service: Orthopedics;  Laterality: Left;   EXTRACORPOREAL SHOCK WAVE LITHOTRIPSY   05-02-2009   HERNIA REPAIR       INGUINAL HERNIA REPAIR Bilateral 04/16/2015    Procedure: LAPAROSCOPIC BILATERAL INGUINAL HERNIA REPAIR;  Surgeon: Michael Boston, MD;  Location: [facility];  Service: General;  Laterality: Bilateral;   INSERTION OF MESH Bilateral 04/16/2015    Procedure: INSERTION OF MESH;  Surgeon: Michael Boston, MD;  Location: [facility];  Service: General;  Laterality: Bilateral;   ORIF CALCANEOUS FRACTURE   11/12/2011    Procedure: OPEN REDUCTION INTERNAL FIXATION (ORIF) CALCANEOUS FRACTURE;  Surgeon: Wylene Simmer, MD;  Location: [place];  Service: Orthopedics;  Laterality: Right;  ORIF RIGHT CALCANEOUS FRACTURE   ORIF TIBIA FRACTURE   11/17/2011    Procedure: OPEN REDUCTION INTERNAL FIXATION (ORIF) TIBIA FRACTURE;  Surgeon: Wylene Simmer, MD;  Location: [facility];  Service: Orthopedics;  Laterality: Left;  REMOVAL OF EXFIX, ORIF LEFT TIBIAL PILON FRACTURE   TONSILLECTOMY AND ADENOIDECTOMY   CHILD   TRANSURETHRAL RESECTION OF BLADDER TUMOR WITH GYRUS (TURBT-GYRUS) N/A 08/31/2013    Procedure: TRANSURETHRAL RESECTION OF BLADDER TUMOR WITH GYRUS;  Surgeon: Jorja Loa, MD;  Location: [facility];  Service: Urology;  Laterality: N/A;   TRANSURETHRAL RESECTION OF PROSTATE   X2   YRS AGO    BEFORE 1996   TRANSURETHRAL RESECTION OF PROSTATE   09/07/2011    Procedure: TRANSURETHRAL RESECTION OF THE PROSTATE WITH GYRUS INSTRUMENTS;  Surgeon: Franchot Gallo, MD;  Location: [facility];  Service: Urology;  Laterality: N/A;   TRANSURETHRAL RESECTION OF PROSTATE N/A 08/31/2013    Procedure: TRANSURETHRAL RESECTION OF THE PROSTATE WITH GYRUS ;  Surgeon: Jorja Loa, MD;  Location: [facility];  Service: Urology;  Laterality: N/A;   UMBILICAL HERNIA REPAIR N/A 04/16/2015    Procedure: LAPAROSCOPIC UMBILICAL HERNIA;  Surgeon: Michael Boston, MD;  Location: [facility];   Service: General;  Laterality: N/A;      Social History Larry Randall  reports that he quit smoking about 27 years ago. His smoking use included cigarettes. He has a 30.00 pack-year smoking history. He has never used smokeless tobacco. He reports that he does not drink alcohol and does not use drugs.   family history includes Heart disease in his father; Hypertension in his father.   No Known Allergies       PHYSICAL EXAMINATION: Vital signs: BP 120/78 (BP Location: Left Arm, Patient Position: Sitting, Cuff Size: Normal)   Pulse (!) 112 Comment: irregular  Ht 5' 7.25" (1.708 m) Comment: height measured without shoes  Wt 191 lb 4 oz (86.8 kg)   BMI 29.73 kg/m   Constitutional: generally  well-appearing, no acute distress Psychiatric: alert and oriented x3, cooperative.  Appears depressed Eyes: extraocular movements intact, anicteric, conjunctiva pink Mouth: oral pharynx moist, no lesions Neck: supple no lymphadenopathy Cardiovascular: heart regular rate and rhythm, no murmur Lungs: clear to auscultation bilaterally Abdomen: soft, nontender, nondistended, no obvious ascites, no peritoneal signs, normal bowel sounds, no organomegaly.  No obvious bruit Rectal: Omitted Extremities: no, cyanosis, or lower extremity edema bilaterally Skin: no lesions on visible extremities Neuro: No focal deficits.  Cranial nerves intact   ASSESSMENT:   1.  Dysphagia.  Somewhat vague.  Previous EGD for the same 10 years ago was normal.  He was dilated.  He did feel that this may have helped. 2.  Possible GERD 3.  Neck pain at other non-GI somatic complaints 4.  Question abdominal aortic aneurysm 5.  Colonoscopy 10 years ago as described     PLAN:   1.  Upper endoscopy with possible esophageal dilation.The nature of the procedure, as well as the risks, benefits, and alternatives were carefully and thoroughly reviewed with the patient. Ample time for discussion and questions allowed. The patient  understood, was satisfied, and agreed to proceed.  2.  Consider PPI 3.  Obtain outside imaging to confirm abdominal aortic aneurysm, and if so--it's size. A total time of 60 minutes was spent preparing to see the patient, obtaining history, performing medically appropriate physical exam, counseling and educating the patient regarding the above listed issues, ordering therapeutic endoscopic procedure, and documenting clinical information in the health record.   ADDENDUM: Ultrasound report dated March 04, 2021 obtained and reviewed.  Distal abdominal aortic aneurysm measuring 4.2 cm in maximal diameter.

## 2022-01-12 NOTE — Progress Notes (Signed)
Pt's states no medical or surgical changes since previsit or office visit. 

## 2022-01-13 ENCOUNTER — Telehealth: Payer: Self-pay | Admitting: *Deleted

## 2022-01-13 NOTE — Telephone Encounter (Signed)
  Follow up Call-     01/12/2022    1:48 PM  Call back number  Post procedure Call Back phone  # [phone]  Permission to leave phone message Yes     Patient questions:  Do you have a fever, pain , or abdominal swelling? No. Pain Score  0 *  Have you tolerated food without any problems? Yes.    Have you been able to return to your normal activities? Yes.    Do you have any questions about your discharge instructions: Diet   No. Medications  No. Follow up visit  No.  Do you have questions or concerns about your Care? No.  Actions: * If pain score is 4 or above: No action needed, pain <4.

## 2022-01-19 ENCOUNTER — Encounter: Payer: Self-pay | Admitting: Internal Medicine

## 2022-05-24 ENCOUNTER — Emergency Department (HOSPITAL_COMMUNITY): Payer: PPO

## 2022-05-24 ENCOUNTER — Other Ambulatory Visit: Payer: Self-pay

## 2022-05-24 ENCOUNTER — Emergency Department (HOSPITAL_COMMUNITY)
Admission: EM | Admit: 2022-05-24 | Discharge: 2022-05-25 | Disposition: A | Discharge status: Home / Self Care | Payer: PPO | Attending: Emergency Medicine | Admitting: Emergency Medicine

## 2022-05-24 DIAGNOSIS — R55 Syncope and collapse: Secondary | ICD-10-CM | POA: Insufficient documentation

## 2022-05-24 DIAGNOSIS — R42 Dizziness and giddiness: Secondary | ICD-10-CM | POA: Insufficient documentation

## 2022-05-24 DIAGNOSIS — M542 Cervicalgia: Secondary | ICD-10-CM | POA: Diagnosis not present

## 2022-05-24 DIAGNOSIS — Z7982 Long term (current) use of aspirin: Secondary | ICD-10-CM | POA: Insufficient documentation

## 2022-05-24 DIAGNOSIS — R079 Chest pain, unspecified: Secondary | ICD-10-CM | POA: Insufficient documentation

## 2022-05-24 LAB — TROPONIN I (HIGH SENSITIVITY)
Troponin I (High Sensitivity): 13 ng/L (ref <18)
Troponin I (High Sensitivity): 13 ng/L (ref <18)

## 2022-05-24 LAB — CBC WITH DIFFERENTIAL/PLATELET
Abs Immature Granulocytes: 0.03 10*3/uL (ref 0.00–0.07)
Basophils Absolute: 0.1 10*3/uL (ref 0.0–0.1)
Basophils Relative: 1 %
Eosinophils Absolute: 0.2 10*3/uL (ref 0.0–0.5)
Eosinophils Relative: 2 %
HCT: 40 % (ref 39.0–52.0)
Hemoglobin: 14.2 g/dL (ref 13.0–17.0)
Immature Granulocytes: 0 %
Lymphocytes Relative: 10 %
Lymphs Abs: 1.2 10*3/uL (ref 0.7–4.0)
MCH: 30.5 pg (ref 26.0–34.0)
MCHC: 35.5 g/dL (ref 30.0–36.0)
MCV: 85.8 fL (ref 80.0–100.0)
Monocytes Absolute: 0.9 10*3/uL (ref 0.1–1.0)
Monocytes Relative: 8 %
Neutro Abs: 8.9 10*3/uL — ABNORMAL HIGH (ref 1.7–7.7)
Neutrophils Relative %: 79 %
Platelets: 270 10*3/uL (ref 150–400)
RBC: 4.66 MIL/uL (ref 4.22–5.81)
RDW: 12.9 % (ref 11.5–15.5)
WBC: 11.3 10*3/uL — ABNORMAL HIGH (ref 4.0–10.5)
nRBC: 0 % (ref 0.0–0.2)

## 2022-05-24 LAB — BASIC METABOLIC PANEL
Anion gap: 10 (ref 5–15)
BUN: 16 mg/dL (ref 8–23)
CO2: 24 mmol/L (ref 22–32)
Calcium: 8.9 mg/dL (ref 8.9–10.3)
Chloride: 103 mmol/L (ref 98–111)
Creatinine, Ser: 1.22 mg/dL (ref 0.61–1.24)
GFR, Estimated: >60 mL/min (ref >60)
Glucose, Bld: 122 mg/dL — ABNORMAL HIGH (ref 70–99)
Potassium: 4.1 mmol/L (ref 3.5–5.1)
Sodium: 137 mmol/L (ref 135–145)

## 2022-05-24 LAB — CBG MONITORING, ED: Glucose-Capillary: 119 mg/dL — ABNORMAL HIGH (ref 70–99)

## 2022-05-24 NOTE — ED Provider Notes (Signed)
11:02 PM Assumed care from Dr. Rodena Medin, please see their note for full history, physical and decision making until this point. In brief this is a 72 y.o. year old male who presented to the ED tonight with Near Syncope     Upper chest pain with associated near syncopal episode. First troponin ok. Feels fine here.   On reevaluation patient is still pain free. Him and his brother think his chronic neck pain was a large contributor to his symptoms and the fact that he has consistent indigestion decreasing PO intake. Troponin is negative. Requests different Rx for indigestion. Will fu w/ pcp for further workup.   Discharge instructions, including strict return precautions for new or worsening symptoms, given. Patient and/or family verbalized understanding and agreement with the plan as described.   Labs, studies and imaging reviewed by myself and considered in medical decision making if ordered. Imaging interpreted by radiology.  Labs Reviewed  CBC WITH DIFFERENTIAL/PLATELET - Abnormal; Notable for the following components:      Result Value   WBC 11.3 (*)    Neutro Abs 8.9 (*)    All other components within normal limits  BASIC METABOLIC PANEL - Abnormal; Notable for the following components:   Glucose, Bld 122 (*)    All other components within normal limits  CBG MONITORING, ED - Abnormal; Notable for the following components:   Glucose-Capillary 119 (*)    All other components within normal limits  TROPONIN I (HIGH SENSITIVITY)  TROPONIN I (HIGH SENSITIVITY)    DG Chest Portable 1 View  Final Result      No follow-ups on file.    Camp Gopal, Barbara Cower, MD 05/25/22 [phone]

## 2022-05-24 NOTE — ED Triage Notes (Signed)
At [facility] when pt felt a band of pain across his forehead and he felt like he was going to pass out. Pt sat on floor until ems showed up. On pt was pale and diaphoretic with low bp and poor radial pulses. 1 liter fluids given in route.

## 2022-05-24 NOTE — ED Provider Notes (Signed)
[place] EMERGENCY DEPARTMENT AT [facility] Provider Note   CSN: [id number] Arrival date & time: 05/24/22  2041     History {Add pertinent medical, surgical, social history, OB history to HPI:1} Chief Complaint  Patient presents with   Near Syncope    Larry Randall is a 72 y.o. male.  72 year old male with prior medical history as detailed below presents for evaluation.  Patient reports longstanding chronic issues with neck pain.  Patient reports that he was at [facility] earlier today.  He had his normal posterior neck pain.  Today it was associated with a sweeping band of pain across his upper chest.  This was associated with a sense of lightheadedness.  The patient denies actual loss of consciousness.  Patient does report that he sat down in [employer] in order to help prevent himself from passing out.  Patient reports that his period of discomfort lasted approximately 20 minutes.  He denies any chest pain now.  He denies any prior history of cardiac disease.  He reports recent cardiology evaluation in [place] within the last year.  He reports that no clear problems were identified.  The history is provided by the patient and medical records.       Home Medications Prior to Admission medications   Medication Sig Start Date End Date Taking? Authorizing Provider  aspirin EC 81 MG tablet Take 81 mg by mouth as needed. Swallow whole. Patient not taking: Reported on 12/11/2021    [provider]  calcium carbonate (ALKA-SELTZER HEARTBURN) 750 MG chewable tablet Chew 1-2 tablets by mouth as needed for heartburn.    [provider]  citalopram (CELEXA) 40 MG tablet Take 40 mg by mouth daily. 07/22/15   [provider]  fluticasone (FLONASE) 50 MCG/ACT nasal spray Place 2 sprays into both nostrils daily.  04/16/12   [provider]  losartan (COZAAR) 100 MG tablet Take 100 mg by mouth every morning.    [provider]  nitroGLYCERIN  (NITROSTAT) 0.4 MG SL tablet Place 1 tablet (0.4 mg total) under the tongue every 5 (five) minutes as needed. Patient not taking: Reported on 11/18/2021 05/28/21 11/18/21  Revankar, Aundra Dubin, MD  pantoprazole (PROTONIX) 40 MG tablet Take 1 tablet (40 mg total) by mouth daily. 01/12/22   Hilarie Fredrickson, MD  rosuvastatin (CRESTOR) 40 MG tablet Take 40 mg by mouth daily.    [provider]  thyroid (ARMOUR) 60 MG tablet Take 60 mg by mouth daily before breakfast.     [provider]  WEEKLY-D 1.25 MG (50000 UT) capsule Take 50,000 Units by mouth once a week. 12/02/21   [provider]      Allergies    Patient has no known allergies.    Review of Systems   Review of Systems  All other systems reviewed and are negative.   Physical Exam Updated Vital Signs BP (!) 154/82   Pulse 68   Temp 97.8 F (36.6 C) (Oral)   Resp 12   Ht 5\' 10"  (1.778 m)   Wt 83.5 kg   SpO2 99%   BMI 26.40 kg/m  Physical Exam Vitals and nursing note reviewed.  Constitutional:      General: He is not in acute distress.    Appearance: Normal appearance. He is well-developed.  HENT:     Head: Normocephalic and atraumatic.  Eyes:     Conjunctiva/sclera: Conjunctivae normal.     Pupils: Pupils are equal, round, and reactive to  light.  Cardiovascular:     Rate and Rhythm: Normal rate and regular rhythm.     Heart sounds: Normal heart sounds.  Pulmonary:     Effort: Pulmonary effort is normal. No respiratory distress.     Breath sounds: Normal breath sounds.  Abdominal:     General: There is no distension.     Palpations: Abdomen is soft.     Tenderness: There is no abdominal tenderness.  Musculoskeletal:        General: No deformity. Normal range of motion.     Cervical back: Normal range of motion and neck supple.  Skin:    General: Skin is warm and dry.  Neurological:     General: No focal deficit present.     Mental Status: He is alert and oriented to person, place, and time.      ED Results / Procedures / Treatments   Labs (all labs ordered are listed, but only abnormal results are displayed) Labs Reviewed  CBC WITH DIFFERENTIAL/PLATELET - Abnormal; Notable for the following components:      Result Value   WBC 11.3 (*)    Neutro Abs 8.9 (*)    All other components within normal limits  BASIC METABOLIC PANEL - Abnormal; Notable for the following components:   Glucose, Bld 122 (*)    All other components within normal limits  CBG MONITORING, ED - Abnormal; Notable for the following components:   Glucose-Capillary 119 (*)    All other components within normal limits  TROPONIN I (HIGH SENSITIVITY)  TROPONIN I (HIGH SENSITIVITY)    EKG EKG Interpretation  Date/Time:  Sunday May 24 2022 21:10:47 EDT Ventricular Rate:  73 PR Interval:  176 QRS Duration: 107 QT Interval:  429 QTC Calculation: 473 R Axis:   61 Text Interpretation: Sinus rhythm Low voltage, extremity leads Abnormal R-wave progression, early transition Abnormal T, consider ischemia, anterior leads Confirmed by Kristine Royal [phone]) on 05/24/2022 9:22:27 PM  Radiology DG Chest Portable 1 View  Result Date: 05/24/2022 CLINICAL DATA:  Weakness. EXAM: PORTABLE CHEST 1 VIEW COMPARISON:  March 11, 2022 FINDINGS: The heart size and mediastinal contours are within normal limits. There is no evidence of acute infiltrate, pleural effusion or pneumothorax. The visualized skeletal structures are unremarkable. IMPRESSION: No acute cardiopulmonary disease. Electronically Signed   By: Aram Candela M.D.   On: 05/24/2022 21:40    Procedures Procedures  {Document cardiac monitor, telemetry assessment procedure when appropriate:1}  Medications Ordered in ED Medications - No data to display  ED Course/ Medical Decision Making/ A&P   {   Click here for ABCD2, HEART and other calculatorsREFRESH Note before signing :1}                          Medical Decision Making   Medical Screen  Complete  This patient presented to the ED with complaint of atypical chest discomfort, near syncope.  This complaint involves an extensive number of treatment options. The initial differential diagnosis includes, but is not limited to, ACS, metabolic abnormality, etc.  This presentation is: Acute, Self-Limited, Previously Undiagnosed, Uncertain Prognosis, Complicated, Systemic Symptoms, and Threat to Life/Bodily Function  Patient is presenting with atypical chest discomfort with associated near syncopal symptoms.  Patient's symptoms have resolved on ED evaluation.    Initial EKG is without suggestion of acute ischemia.  Initial troponin is reassuring.  Initial screening labs obtained thus far reassuring.  Case discussed with oncoming EDP -  Mesner - who will complete ED workup and reevaluate. Additional history obtained:  External records from outside sources obtained and reviewed including prior ED visits and prior Inpatient records.    Lab Tests:  I ordered and personally interpreted labs.  The pertinent results include: CBC, BMP, troponin   Imaging Studies ordered:  I ordered imaging studies including chest x-ray I independently visualized and interpreted obtained imaging which showed NAD I agree with the radiologist interpretation.   Cardiac Monitoring:  The patient was maintained on a cardiac monitor.  I personally viewed and interpreted the cardiac monitor which showed an underlying rhythm of: NSR   Problem List / ED Course:  Atypical chest pain   Reevaluation:  After the interventions noted above, I reevaluated the patient and found that they have: resolved   Disposition:  After consideration of the diagnostic results and the patients response to treatment, I feel that the patent would benefit from completion of ED evaluation.    {Document critical care time when appropriate:1} {Document review of labs and clinical decision tools ie heart score,  Chads2Vasc2 etc:1}  {Document your independent review of radiology images, and any outside records:1} {Document your discussion with family members, caretakers, and with consultants:1} {Document social determinants of health affecting pt's care:1} {Document your decision making why or why not admission, treatments were needed:1} Final Clinical Impression(s) / ED Diagnoses Final diagnoses:  None    Rx / DC Orders ED Discharge Orders     None

## 2022-05-25 MED ORDER — FAMOTIDINE 20 MG PO TABS: 20.0000 mg | ORAL_TABLET | Freq: Two times a day (BID) | ORAL | 0 refills | Status: DC

## 2022-05-25 MED ORDER — ALUM & MAG HYDROXIDE-SIMETH 400-400-40 MG/5ML PO SUSP: 15.0000 mL | Freq: Four times a day (QID) | ORAL | 0 refills | Status: DC | PRN

## 2022-05-25 MED ORDER — OMEPRAZOLE 20 MG PO CPDR: 20.0000 mg | DELAYED_RELEASE_CAPSULE | Freq: Every day | ORAL | 0 refills | Status: DC

## 2022-06-04 ENCOUNTER — Telehealth: Payer: Self-pay | Admitting: Internal Medicine

## 2022-06-04 NOTE — Telephone Encounter (Signed)
1.  This medication is excellent for his problems with acid reflux, esophagitis, stricture. 2.  This medication does not treat bloating and gas. 3.  Thus, stay on pantoprazole 40 mg daily 4.  For bloating and gas, sent him a anti-gas dietary sheet or brochure. 5.  Have him take Gaviscon as needed for gas and bloating. 6.  He could schedule a follow-up office visit.  My availability is limited, thus send him to one of our advanced practitioners

## 2022-06-04 NOTE — Telephone Encounter (Signed)
Inbound call from patient, states he was told by Dr. Marina Goodell to call if his medication Pantoprazole was not working anymore and he would be able to change to an alternative. Patient states he is now experiencing gas and bloating and would like to discuss a different medication and would like to speak to a nurse to further advise.

## 2022-06-05 MED ORDER — GAVISCON 80-14.2 MG PO CHEW: CHEWABLE_TABLET | ORAL | 0 refills | Status: DC

## 2022-06-05 NOTE — Telephone Encounter (Signed)
Called and spoke with patient regarding Dr. Lamar Sprinkles recommendations. Patient will continue Pantoprazole 40 mg daily and he will purchase Gaviscon OTC and take PRN for gas and bloating. Patient has been scheduled for a f/u with Willette Cluster, NP on Wednesday, 08/05/22 at 2 pm. Patient is aware that I will mail his appt information along with a gas and flatulence prevention diet. Pt confirmed address on file. Patient verbalized understanding of all information and had no concerns at the end of the call.

## 2022-08-05 ENCOUNTER — Telehealth: Payer: Self-pay | Admitting: Nurse Practitioner

## 2022-08-05 ENCOUNTER — Ambulatory Visit: Payer: PPO | Admitting: Nurse Practitioner

## 2022-08-05 ENCOUNTER — Encounter: Payer: Self-pay | Admitting: Nurse Practitioner

## 2022-08-05 VITALS — BP 122/68 | HR 90 | Ht 70.0 in | Wt 188.2 lb

## 2022-08-05 DIAGNOSIS — K219 Gastro-esophageal reflux disease without esophagitis: Secondary | ICD-10-CM | POA: Diagnosis not present

## 2022-08-05 DIAGNOSIS — R140 Abdominal distension (gaseous): Secondary | ICD-10-CM | POA: Diagnosis not present

## 2022-08-05 DIAGNOSIS — R0789 Other chest pain: Secondary | ICD-10-CM | POA: Diagnosis not present

## 2022-08-05 NOTE — Progress Notes (Signed)
Reviewed. He would be a candidate for follow-up colonoscopy at age 72, if he is medically fit and willing.

## 2022-08-05 NOTE — Telephone Encounter (Signed)
PT is calling to verify that he is taking pantoprazole and omeprazole but he is not taking famotidine. Please advise

## 2022-08-05 NOTE — Patient Instructions (Signed)
_______________________________________________________  If your blood pressure at your visit was 140/90 or greater, please contact your primary care physician to follow up on this. _______________________________________________________  If you are age 74 or older, your body mass index should be between 23-30. Your Body mass index is 27 kg/m. If this is out of the aforementioned range listed, please consider follow up with your Primary Care Provider. ________________________________________________________  The  GI providers would like to encourage you to use [facility] to communicate with providers for non-urgent requests or questions.  Due to long hold times on the telephone, sending your provider a message by [facility] may be a faster and more efficient way to get a response.  Please allow 48 business hours for a response.  Please remember that this is for non-urgent requests.  _______________________________________________________  Please purchase the following medications over the counter and take as directed:  START: Gaviscon  Please call Joni Reining, CMA to clarify the medications you are currently taking for GERD (omeprazole, pantoprazole, Pepcid).  Thank you for entrusting me with your care and choosing [facility].  Gunnar Fusi, NP

## 2022-08-05 NOTE — Progress Notes (Signed)
Assessment and Plan   Primary GI: Yancey Flemings, MD   Brief Narrative:  72 y.o.  male whose past medical history includes,  but is not necessarily limited to, GERD, chronic nausea, diverticulosis, colon polyps, anxiety, hypertension, AAA measuring 4.2 cm on ultrasound in January 2023.   GERD complicated by peptic stricture status post dilation November 2023.  No recurrent dysphagia.  He complains of a raw feeling in throat and chest but this seems more related to sinus drainage.  -There is a discrepancy in GERD medications. We prescribed pantoprazole 40 mg daily but home med list shows he is on BID famotidine and daily omeprazole 20 mg . Patient is unsure which GERD medication he takes. He will check home meds and then call us to clarify  Bloating / gas. We had recommended trial of Gaviscon when he called the office in April.  He could not recall the name of the medication so he has not started it.  -Trial of Gaviscon as previously recommended  Colon cancer screening. Last colonoscopy was in 2013, a diminutive polyp was removed ( ? Path) -Will defer to Dr. Marina Goodell about whether he feels a 10 year colonoscopy is needed.     History of Present Illness   Chief complaint: gas, bloating, raw throat  Patient was last seen 11/18/2021 by Dr. Marina Goodell for evaluation of dysphagia.  He subsequently underwent an EGD with findings of a peptic stricture s/p dilation and non-H.pylori related gastritis. .   Patient called the office 06/04/2022, felt like pantoprazole was not working anymore.  He was having gas and bloating.  Advised to stay on pantoprazole daily.  He was given an anti-gas diet and scheduled for an appointment with me  Interval History:   Larry Randall really didn't find much on the list of foods that he ate enough of to have a problems. He didn't start Gaviscon ( couldn't remember the name of the med). He is still bloated, has excessive flatulence. His main concern is that he his throat and chest  feel raw. He has a lot of sinus drainage. Mucomyst help but he doesn't use it on a regular basis. We had prescribed Pantoprazole 40 mg daily following his colonoscopy.  However, he has omeprazole 20 mg daily and famotidine 20 mg twice daily on his home med list instead.  He really is not sure what he is taking for reflux.  Alka-Seltzer heartburn and Maalox is also on his home med list but he is apparently not taking them.    Previous GI Endoscopies / Labs / Imaging   EGD 01/12/22 -GERD complicated by esophagitis and peptic stricture status post dilation 2. Antral erosions status post biopsies 3. Otherwise unremarkable EGD.He was prescribed pantoprazole 40 mg daily  Surgical [P], gastric antrum - ANTRAL AND TRANSITIONAL MUCOSAE WITH REACTIVE (CHEMICAL) GASTROPATHY - IMMUNOHISTOCHEMICAL STAIN FOR HELICOBACTER ORGANISMS IS NEGATIVE     Latest Ref Rng & Units 05/28/2021    8:49 AM 08/14/2013    9:34 AM 01/29/2012    1:28 PM  Hepatic Function  Total Protein 6.0 - 8.5 g/dL 6.6  7.2  7.9   Albumin 3.7 - 4.7 g/dL 4.4  4.0  4.3   AST 0 - 40 IU/L 30  37  17   ALT 0 - 44 IU/L 23  45  13   Alk Phosphatase 44 - 121 IU/L 113  130  149   Total Bilirubin 0.0 - 1.2 mg/dL 0.7  0.9  0.5   Bilirubin,  Direct 0.00 - 0.40 mg/dL 1.61          Latest Ref Rng & Units 05/24/2022    9:25 PM 09/09/2015   10:20 PM 04/11/2015    2:00 PM  CBC  WBC 4.0 - 10.5 K/uL 11.3  14.5  11.6   Hemoglobin 13.0 - 17.0 g/dL 09.6  04.5  40.9   Hematocrit 39.0 - 52.0 % 40.0  43.8  45.7   Platelets 150 - 400 K/uL 270  280  347     Past Medical History:  Diagnosis Date   AAA (abdominal aortic aneurysm) (HCC)    Allergic rhinitis    Allergy    Anxiety    Arthritis    LEFT LEG AND ANKLE   At risk for sleep apnea    STOP-BANG=  6   SENT TO PCP 08-28-2013   Atherosclerosis of aorta (HCC)    Atypical rash OF MOUTH -- PER PT UNKNOWN ETIOLOGY   RX MOUTHWASH BID-  PT STATES POSSIBLE FROM GERD   Bladder mass    BLADDER NECK AREA    Borderline diabetes    Chronic pain due to trauma 03/29/2012   Coronary artery disease involving native coronary artery of native heart without angina pectoris 07/25/2015   Depression    Dyslipidemia, goal LDL below 70 07/25/2015   Essential hypertension 07/25/2015   GERD (gastroesophageal reflux disease)    H/O hiatal hernia    Hemorrhagic cystitis 08/14/2013   History of CHF (congestive heart failure)    History of colon polyps    2013   History of gastric ulcer    History of kidney stones    History of urinary retention    Hx of myocardial infarction 07/25/2015   Hyperlipemia    Hypertrophy of prostate with urinary obstruction and other lower urinary tract symptoms (LUTS) 08/31/2013   Hypothyroidism    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 IMPRESSION: V3    Past Surgical History:  Procedure Laterality Date   COLONOSCOPY WITH ESOPHAGOGASTRODUODENOSCOPY (EGD)  10-29-2011   CORONARY ANGIOPLASTY WITH STENT PLACEMENT  1996   PER PT X3 STENTS (BARE METAL)   CYSTOSCOPY W/ RETROGRADES N/A 08/31/2013   Procedure: CYSTOSCOPY WITH bilateral RETROGRADE PYELOGRAM;  Surgeon: Chelsea Aus, MD;  Location: [facility];  Service: Urology;  Laterality: N/A;   EXTERNAL FIXATION LEG  11/09/2011   Procedure: EXTERNAL FIXATION LEG;  Surgeon: Toni Arthurs, MD;  Location: [facility];  Service: Orthopedics;  Laterality: Left;   EXTRACORPOREAL SHOCK WAVE LITHOTRIPSY  05-02-2009   HERNIA REPAIR     INGUINAL HERNIA REPAIR Bilateral 04/16/2015    Procedure: LAPAROSCOPIC BILATERAL INGUINAL HERNIA REPAIR;  Surgeon: Karie Soda, MD;  Location: [facility];  Service: General;  Laterality: Bilateral;   INSERTION OF MESH Bilateral 04/16/2015   Procedure: INSERTION OF MESH;  Surgeon: Karie Soda, MD;  Location: [facility];  Service: General;  Laterality: Bilateral;   ORIF CALCANEOUS FRACTURE  11/12/2011   Procedure: OPEN REDUCTION INTERNAL FIXATION (ORIF) CALCANEOUS  FRACTURE;  Surgeon: Toni Arthurs, MD;  Location: [facility];  Service: Orthopedics;  Laterality: Right;  ORIF RIGHT CALCANEOUS FRACTURE   ORIF TIBIA FRACTURE  11/17/2011   Procedure: OPEN REDUCTION INTERNAL FIXATION (ORIF) TIBIA FRACTURE;  Surgeon: Toni Arthurs, MD;  Location: [facility];  Service: Orthopedics;  Laterality: Left;  REMOVAL OF EXFIX, ORIF LEFT TIBIAL PILON FRACTURE   TONSILLECTOMY AND ADENOIDECTOMY  CHILD   TRANSURETHRAL RESECTION OF BLADDER TUMOR WITH GYRUS (TURBT-GYRUS) N/A 08/31/2013   Procedure: TRANSURETHRAL RESECTION OF BLADDER TUMOR WITH GYRUS;  Surgeon: Chelsea Aus, MD;  Location: [facility];  Service: Urology;  Laterality: N/A;   TRANSURETHRAL RESECTION OF PROSTATE  X2   YRS AGO   BEFORE 1996   TRANSURETHRAL RESECTION OF PROSTATE  09/07/2011   Procedure: TRANSURETHRAL RESECTION OF THE PROSTATE WITH GYRUS INSTRUMENTS;  Surgeon: Marcine Matar, MD;  Location: [facility];  Service: Urology;  Laterality: N/A;   TRANSURETHRAL RESECTION OF PROSTATE N/A 08/31/2013   Procedure: TRANSURETHRAL RESECTION OF THE PROSTATE WITH GYRUS ;  Surgeon: Chelsea Aus, MD;  Location: [facility];  Service: Urology;  Laterality: N/A;   UMBILICAL HERNIA REPAIR N/A 04/16/2015   Procedure: LAPAROSCOPIC UMBILICAL HERNIA;  Surgeon: Karie Soda, MD;  Location: [facility];  Service: General;  Laterality: N/A;    Current Medications, Allergies, Family History and Social History were reviewed in [employer] [facility] electronic medical record.     Current  Outpatient Medications  Medication Sig Dispense Refill   alum & mag hydroxide-simeth (MAALOX PLUS) 400-400-40 MG/5ML suspension Take 15 mLs by mouth every 6 (six) hours as needed for indigestion. 355 mL 0   Alum Hydroxide-Mag Trisilicate (GAVISCON) 80-14.2 MG CHEW Chew 2-4 tablets after meals and at bedtime as needed 30 tablet 0   calcium carbonate (ALKA-SELTZER HEARTBURN) 750 MG chewable tablet Chew 1-2 tablets by mouth as needed for heartburn.     citalopram (CELEXA) 40 MG tablet Take 40 mg by mouth daily.     famotidine (PEPCID) 20 MG tablet Take 1 tablet (20 mg total) by mouth 2 (two) times daily. 10 tablet 0   fluticasone (FLONASE) 50 MCG/ACT nasal spray Place 2 sprays into both nostrils daily.      losartan (COZAAR) 100 MG tablet Take 100 mg by mouth every morning.     meloxicam (MOBIC) 7.5 MG tablet Take by mouth.     omeprazole (PRILOSEC) 20 MG capsule Take 1 capsule (20 mg total) by mouth daily. 30 capsule 0   rosuvastatin (CRESTOR) 40 MG tablet Take 40 mg by mouth daily.     thyroid (ARMOUR) 60 MG tablet Take 60 mg by mouth daily before breakfast.      WEEKLY-D 1.25 MG (50000 UT) capsule Take 50,000 Units by mouth once a week.     No current facility-administered medications for this visit.    Review of Systems: No shortness of breath. Some urinary hesitancy. Positive for chronic neck pain    Physical Exam  Wt Readings from Last 3 Encounters:  08/05/22 188 lb 3.2 oz (85.4 kg)  05/24/22 184 lb (83.5 kg)  01/12/22 189 lb 3.2 oz (85.8 kg)    BP 122/68   Pulse 90   Ht 5\' 10"  (1.778 m)   Wt 188 lb 3.2 oz (85.4 kg)   SpO2 97%   BMI 27.00 kg/m  Constitutional:  Generally well appearing male in no acute distress. Psychiatric: Normal mood and affect. Behavior is normal. EENT: Pupils normal.  Conjunctivae  are normal. No scleral icterus. Neck supple.  Cardiovascular: Normal rate, regular rhythm.  Pulmonary/chest: Effort normal and breath sounds normal. No wheezing, rales or  rhonchi. Abdominal: Soft, nondistended, nontender. Bowel sounds active throughout. There are no masses palpable. No hepatomegaly. Neurological: Alert and oriented to person place and time.    Willette Cluster, NP  08/05/2022, 4:10 PM  Cc:

## 2022-08-05 NOTE — Telephone Encounter (Signed)
Returned call to patient to verify his medications.  He is currently taking omeprazole 20mg  one capsule every day and pantoprazole 40mg  one tablet every day.  He is not currently using Pepcid/famotidine.  Please advise.

## 2022-08-05 NOTE — Progress Notes (Deleted)
Assessment and Plan   Primary GI:  Brief Narrative:  72 y.o.  male whose past medical history includes,  but is not necessarily limited to, GERD, chronic nausea, diverticulosis, colon polyps, anxiety, hypertension, AAA measuring 4.2 cm on ultrasound in January 2023.    Patient was last seen 11/18/2021 by Dr. Marina Goodell for evaluation of dysphagia.  He subsequently underwent an EGD.   EGD 01/12/22 -GERD complicated by esophagitis and peptic stricture status post dilation 2. Antral erosions status post biopsies 3. Otherwise unremarkable EGD.He was prescribed pantoprazole 40 mg daily  Surgical [P], gastric antrum - ANTRAL AND TRANSITIONAL MUCOSAE WITH REACTIVE (CHEMICAL) GASTROPATHY - IMMUNOHISTOCHEMICAL STAIN FOR HELICOBACTER ORGANISMS IS NEGATIVE  Patient called the office 06/04/2022, felt like pantoprazole was not working anymore.  He was having gas and bloating.  Advised to stay on pantoprazole daily.  He was given an anti-gas diet, advised to try this, and schedule an appointment with me       Previous GI Endoscopies / Labs / Imaging        Latest Ref Rng & Units 05/28/2021    8:49 AM 08/14/2013    9:34 AM 01/29/2012    1:28 PM  Hepatic Function  Total Protein 6.0 - 8.5 g/dL 6.6  7.2  7.9   Albumin 3.7 - 4.7 g/dL 4.4  4.0  4.3   AST 0 - 40 IU/L 30  37  17   ALT 0 - 44 IU/L 23  45  13   Alk Phosphatase 44 - 121 IU/L 113  130  149   Total Bilirubin 0.0 - 1.2 mg/dL 0.7  0.9  0.5   Bilirubin, Direct 0.00 - 0.40 mg/dL 1.61          Latest Ref Rng & Units 05/24/2022    9:25 PM 09/09/2015   10:20 PM 04/11/2015    2:00 PM  CBC  WBC 4.0 - 10.5 K/uL 11.3  14.5  11.6   Hemoglobin 13.0 - 17.0 g/dL 09.6  04.5  40.9   Hematocrit 39.0 - 52.0 % 40.0  43.8  45.7   Platelets 150 - 400 K/uL 270  280  347         Past Medical History:  Diagnosis Date   AAA (abdominal aortic aneurysm) (HCC)    Allergic rhinitis    Allergy    Anxiety    Arthritis    LEFT LEG AND ANKLE   At risk  for sleep apnea    STOP-BANG=  6   SENT TO PCP 08-28-2013   Atherosclerosis of aorta (HCC)    Atypical rash OF MOUTH -- PER PT UNKNOWN ETIOLOGY   RX MOUTHWASH BID-  PT STATES POSSIBLE FROM GERD   Bladder mass    BLADDER NECK AREA   Borderline diabetes    Chronic pain due to trauma 03/29/2012   Coronary artery disease involving native coronary artery of native heart without angina pectoris 07/25/2015   Depression    Dyslipidemia, goal LDL below 70 07/25/2015   Essential hypertension 07/25/2015   GERD (gastroesophageal reflux disease)    H/O hiatal hernia    Hemorrhagic cystitis 08/14/2013   History of CHF (congestive heart failure)    History of colon polyps    2013   History of gastric ulcer    History of kidney stones    History of urinary retention    Hx of myocardial infarction 07/25/2015   Hyperlipemia    Hypertrophy of prostate with urinary obstruction and  other lower urinary tract symptoms (LUTS) 08/31/2013   Hypothyroidism    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 IMPRESSION: V3    Past Surgical History:  Procedure Laterality Date   COLONOSCOPY WITH ESOPHAGOGASTRODUODENOSCOPY (EGD)  10-29-2011   CORONARY ANGIOPLASTY WITH STENT PLACEMENT  1996   PER PT X3 STENTS (BARE METAL)   CYSTOSCOPY W/ RETROGRADES N/A 08/31/2013   Procedure: CYSTOSCOPY WITH bilateral RETROGRADE PYELOGRAM;  Surgeon: Chelsea Aus, MD;  Location: [facility];  Service: Urology;  Laterality: N/A;   EXTERNAL FIXATION LEG  11/09/2011    Procedure: EXTERNAL FIXATION LEG;  Surgeon: Toni Arthurs, MD;  [facility]: [facility];  Service: Orthopedics;  Laterality: Left;   EXTRACORPOREAL SHOCK WAVE LITHOTRIPSY  05-02-2009   HERNIA REPAIR     INGUINAL HERNIA REPAIR Bilateral 04/16/2015   Procedure: LAPAROSCOPIC BILATERAL INGUINAL HERNIA REPAIR;  Surgeon: Karie Soda, MD;  Location: [facility];  Service: General;  Laterality: Bilateral;   INSERTION OF MESH Bilateral 04/16/2015   Procedure: INSERTION OF MESH;  Surgeon: Karie Soda, MD;  Location: [facility];  Service: General;  Laterality: Bilateral;   ORIF CALCANEOUS FRACTURE  11/12/2011   Procedure: OPEN REDUCTION INTERNAL FIXATION (ORIF) CALCANEOUS FRACTURE;  Surgeon: Toni Arthurs, MD;  Location: [facility];  Service: Orthopedics;  Laterality: Right;  ORIF RIGHT CALCANEOUS FRACTURE   ORIF TIBIA FRACTURE  11/17/2011   Procedure: OPEN REDUCTION INTERNAL FIXATION (ORIF) TIBIA FRACTURE;  Surgeon: Toni Arthurs, MD;  Location: [facility];  Service: Orthopedics;  Laterality: Left;  REMOVAL OF EXFIX, ORIF LEFT TIBIAL PILON FRACTURE   TONSILLECTOMY AND ADENOIDECTOMY  CHILD   TRANSURETHRAL RESECTION OF BLADDER TUMOR WITH GYRUS (TURBT-GYRUS) N/A 08/31/2013   Procedure: TRANSURETHRAL RESECTION OF BLADDER TUMOR WITH GYRUS;  Surgeon: Chelsea Aus, MD;  Location: [facility];  Service: Urology;  Laterality: N/A;   TRANSURETHRAL RESECTION OF PROSTATE  X2   YRS AGO   BEFORE 1996   TRANSURETHRAL RESECTION OF PROSTATE  09/07/2011   Procedure: TRANSURETHRAL RESECTION OF THE PROSTATE WITH GYRUS INSTRUMENTS;  Surgeon: Marcine Matar, MD;  Location: [facility];  Service: Urology;  Laterality: N/A;   TRANSURETHRAL RESECTION OF PROSTATE N/A 08/31/2013   Procedure: TRANSURETHRAL RESECTION OF THE PROSTATE WITH GYRUS ;  Surgeon: Chelsea Aus, MD;  Location: [facility];  Service: Urology;  Laterality: N/A;   UMBILICAL HERNIA REPAIR N/A 04/16/2015   Procedure: LAPAROSCOPIC UMBILICAL  HERNIA;  Surgeon: Karie Soda, MD;  Location: [facility];  Service: General;  Laterality: N/A;    Current Medications, Allergies, Family History and Social History were reviewed in [employer] electronic medical record.     Current Outpatient Medications  Medication Sig Dispense Refill   alum & mag hydroxide-simeth (MAALOX PLUS) 400-400-40 MG/5ML suspension Take 15 mLs by mouth every 6 (six) hours as needed for indigestion. 355 mL 0   Alum Hydroxide-Mag Trisilicate (GAVISCON) 80-14.2 MG CHEW Chew 2-4 tablets after meals and at bedtime as needed 30 tablet 0   calcium  carbonate (ALKA-SELTZER HEARTBURN) 750 MG chewable tablet Chew 1-2 tablets by mouth as needed for heartburn.     citalopram (CELEXA) 40 MG tablet Take 40 mg by mouth daily.     famotidine (PEPCID) 20 MG tablet Take 1 tablet (20 mg total) by mouth 2 (two) times daily. 10 tablet 0   fluticasone (FLONASE) 50 MCG/ACT nasal spray Place 2 sprays into both nostrils daily.      losartan (COZAAR) 100 MG tablet Take 100 mg by mouth every morning.     meloxicam (MOBIC) 7.5 MG tablet Take by mouth.     omeprazole (PRILOSEC) 20 MG capsule Take 1 capsule (20 mg total) by mouth daily. 30 capsule 0   rosuvastatin (CRESTOR) 40 MG tablet Take 40 mg by mouth daily.     thyroid (ARMOUR) 60 MG tablet Take 60 mg by mouth daily before breakfast.      WEEKLY-D 1.25 MG (50000 UT) capsule Take 50,000 Units by mouth once a week.     No current facility-administered medications for this visit.    Review of Systems: No chest pain. No shortness of breath. No urinary complaints.    Physical Exam  Wt Readings from Last 3 Encounters:  08/05/22 188 lb 3.2 oz (85.4 kg)  05/24/22 184 lb (83.5 kg)  01/12/22 189 lb 3.2 oz (85.8 kg)    BP 122/68   Pulse 90   Ht 5\' 10"  (1.778 m)   Wt 188 lb 3.2 oz (85.4 kg)   SpO2 97%   BMI 27.00 kg/m  Constitutional:  Pleasant, generally well appearing ***male in no acute distress. Psychiatric: Normal mood and  affect. Behavior is normal. EENT: Pupils normal.  Conjunctivae are normal. No scleral icterus. Neck supple.  Cardiovascular: Normal rate, regular rhythm.  Pulmonary/chest: Effort normal and breath sounds normal. No wheezing, rales or rhonchi. Abdominal: Soft, nondistended, nontender. Bowel sounds active throughout. There are no masses palpable. No hepatomegaly. Neurological: Alert and oriented to person place and time.  Extremities: *** edema Skin: Skin is warm and dry. No rashes noted.  Willette Cluster, NP  08/05/2022, 2:08 PM  Cc:  Erskine Emery, NP

## 2022-08-06 NOTE — Telephone Encounter (Signed)
Left message for patient to return call to further discuss medications.  

## 2022-08-12 ENCOUNTER — Telehealth: Payer: Self-pay

## 2022-08-12 NOTE — Telephone Encounter (Signed)
Left message for patient to return call to further discuss. Will continue efforts.  

## 2022-08-12 NOTE — Telephone Encounter (Signed)
-----   Message from Meredith Pel, NP sent at 08/10/2022  8:54 PM EDT ----- Joni Reining Will you please call this patient.  I recently saw him in clinic and noticed that he was past due for a 10 year colonoscopy. It was due in September 2023 but Dr/ Marina Goodell didn't schedule it when he saw him in clinic Oct 2023. I wanted to check with Dr. Marina Goodell to see if he meant to not get it schedule or what.  He said the patient is due and can be scheduled if he is willing. If he wants to proceed with screening colonoscopy can you put him on with Dr Marina Goodell at [facility]. Not urgent.  Thanks, PG

## 2022-08-14 NOTE — Telephone Encounter (Signed)
Left message for patient to return my call.

## 2022-08-18 NOTE — Telephone Encounter (Signed)
Left message for patient to return call to further discuss. Will continue efforts.  

## 2022-08-18 NOTE — Telephone Encounter (Signed)
Left message for patient to return call.

## 2022-08-19 NOTE — Telephone Encounter (Signed)
Spoke with patient about getting him scheduled for a colonoscopy as Dr Marina Goodell advised he could be scheduled if the patient is willing.  Patient states that he is currently doing physical therapy and does not wish to schedule at this time.  He states he will call back if he decides to schedule. Patient agreed to plan and verbalized understanding. No further questions.

## 2022-08-19 NOTE — Telephone Encounter (Signed)
Patient notified of Paula's recommendations regarding the way he is taking his PPI. Patient advised of the following per Gunnar Fusi, "We always want the patient to be on the lowest effective dose. I would ask him to stay on pantoprazole 40 mg every morning before breakfast.  At bedtime he can take famotidine 20 mg.  If he gets reflux symptoms on this regimen then he should : Take the pantoprazole 40 mg before breakfast and can take the omeprazole before dinner."  Patient stated that he does not eat breakfast. He doesn't get out of bed until midmorning and doesn't eat his first meal of the day until about 4 pm.  Patient advised to continue pantoprazole 40mg  30 minutes before first meal of the day and take famotidine 20mg  at bedtime.  Patient advised if his reflux symptoms worsen on this regimen he should take pantoprazole 40mg  30 minutes before first meal of the day and omeprazole 20mg  at bedtime.  Patient requested that these instructions be mailed to him as he forgetful lately and will not remember the verbal instructions.  Letter mailed to the patient per his request.

## 2023-04-19 ENCOUNTER — Other Ambulatory Visit: Payer: Self-pay | Admitting: Internal Medicine

## 2023-06-18 ENCOUNTER — Other Ambulatory Visit: Payer: Self-pay

## 2023-06-18 DIAGNOSIS — G629 Polyneuropathy, unspecified: Secondary | ICD-10-CM | POA: Insufficient documentation

## 2023-06-18 DIAGNOSIS — R079 Chest pain, unspecified: Secondary | ICD-10-CM | POA: Insufficient documentation

## 2023-06-18 DIAGNOSIS — I714 Abdominal aortic aneurysm, without rupture, unspecified: Secondary | ICD-10-CM | POA: Insufficient documentation

## 2023-06-18 DIAGNOSIS — G4730 Sleep apnea, unspecified: Secondary | ICD-10-CM | POA: Insufficient documentation

## 2023-06-18 DIAGNOSIS — T7840XA Allergy, unspecified, initial encounter: Secondary | ICD-10-CM | POA: Insufficient documentation

## 2023-06-18 DIAGNOSIS — I10 Essential (primary) hypertension: Secondary | ICD-10-CM | POA: Insufficient documentation

## 2023-07-29 ENCOUNTER — Encounter: Payer: Self-pay | Admitting: Cardiology

## 2023-07-29 ENCOUNTER — Ambulatory Visit: Attending: Cardiology | Admitting: Cardiology

## 2023-07-29 VITALS — BP 130/84 | HR 113 | Ht 70.0 in | Wt 197.0 lb

## 2023-07-29 DIAGNOSIS — E785 Hyperlipidemia, unspecified: Secondary | ICD-10-CM

## 2023-07-29 DIAGNOSIS — I10 Essential (primary) hypertension: Secondary | ICD-10-CM

## 2023-07-29 DIAGNOSIS — R079 Chest pain, unspecified: Secondary | ICD-10-CM

## 2023-07-29 DIAGNOSIS — I2510 Atherosclerotic heart disease of native coronary artery without angina pectoris: Secondary | ICD-10-CM

## 2023-07-29 DIAGNOSIS — R011 Cardiac murmur, unspecified: Secondary | ICD-10-CM

## 2023-07-29 HISTORY — DX: Cardiac murmur, unspecified: R01.1

## 2023-07-29 MED ORDER — LOSARTAN POTASSIUM-HCTZ 50-12.5 MG PO TABS: 1.0000 | ORAL_TABLET | Freq: Every day | ORAL | 3 refills | Status: DC

## 2023-07-29 MED ORDER — METOPROLOL SUCCINATE ER 50 MG PO TB24: 50.0000 mg | ORAL_TABLET | Freq: Every day | ORAL | 3 refills | Status: DC

## 2023-07-29 MED ORDER — ASPIRIN 81 MG PO TBEC: 81.0000 mg | DELAYED_RELEASE_TABLET | Freq: Every day | ORAL | 3 refills | Status: AC

## 2023-07-29 NOTE — Progress Notes (Signed)
 Cardiology Office Note:    Date:  07/29/2023   ID:  Larry Randall, DOB Mar 26, 1950, MRN [id number]  PCP:  Marce Sensing, NP  Cardiologist:  Nelia Balzarine, MD   Referring MD: Marce Sensing, NP    ASSESSMENT:    1. Essential hypertension   2. Coronary artery disease involving native coronary artery of native heart without angina pectoris   3. Dyslipidemia, goal LDL below 70   4. Chest pain of uncertain etiology   5. Cardiac murmur    PLAN:    In order of problems listed above:  Coronary artery disease and chest pain: Secondary prevention stressed with the patient.  Importance of compliance with diet medication stressed any vocalized understanding.  Following recommendations were made to him.  He was advised to take a coated baby aspirin on a daily basis.  Sublingual nitroglycerin  prescription was sent, its protocol and 911 protocol explained and the patient vocalized understanding questions were answered to the patient's satisfaction.  I also recommended Lexiscan  sestamibi and is agreeable. Mixed dyslipidemia: On lipid-lowering medications.  He will have blood work when he comes for the DEXA scan.  Goal LDL less than 70. Obesity: Weight reduction stressed diet emphasized and he promises to do better. Essential hypertension: Blood pressure is stable and diet was emphasized. In view of tachycardia made following changes to his medications.  I cut down his ARB hydrochlorothiazide  combination to half dose and initiated him on beta-blocker Toprol-XL 50 mg daily and he will keep a track of his pulse and blood pressure at home.  I will also check his TSH. Patient will be seen in follow-up appointment in 6 months or earlier if the patient has any concerns.    Medication Adjustments/Labs and Tests Ordered: Current medicines are reviewed at length with the patient today.  Concerns regarding medicines are outlined above.  Orders Placed This Encounter  Procedures   EKG 12-Lead   No  orders of the defined types were placed in this encounter.    History of Present Illness:    Larry Randall is a 73 y.o. male who is being seen today for the evaluation of chest pain at the request of Marce Sensing, NP.  Patient is a pleasant 73 year old male.  He has past medical history of coronary artery disease post stenting in the remote past, abdominal aortic aneurysm followed by primary care on a regular basis with ultrasounds, essential hypertension and dyslipidemia.  He mentions to me that he is having chest pain at times.  He was lost to follow-up.  I saw him more than 3 years ago.  No orthopnea or PND.  He does not smoke.  At the time of my evaluation, the patient is alert awake oriented and in no distress.  Past Medical History:  Diagnosis Date   AAA (abdominal aortic aneurysm) (HCC)    Allergic rhinitis    Allergy    Anxiety    Arthritis    LEFT LEG AND ANKLE   At risk for sleep apnea    STOP-BANG=  6   SENT TO PCP 08-28-2013   Atherosclerosis of aorta (HCC)    Atypical rash OF MOUTH -- PER PT UNKNOWN ETIOLOGY   RX MOUTHWASH BID-  PT STATES POSSIBLE FROM GERD   Bladder mass    BLADDER NECK AREA   Borderline diabetes    Chest pain    Chest pain of uncertain etiology 05/28/2021   Chronic pain due to trauma 03/29/2012  Coronary artery disease involving native coronary artery of native heart without angina pectoris 07/25/2015   Depression    Dyslipidemia, goal LDL below 70 07/25/2015   Essential (primary) hypertension    Essential hypertension 07/25/2015   GERD (gastroesophageal reflux disease)    H/O hiatal hernia    Hemorrhagic cystitis 08/14/2013   History of CHF (congestive heart failure)    History of colon polyps    2013   History of gastric ulcer    History of kidney stones    History of urinary retention    Hx of myocardial infarction 07/25/2015   Hyperlipemia    Hypertrophy of prostate with urinary obstruction and other lower urinary tract symptoms  (LUTS) 08/31/2013   Hypothyroidism    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 IMPRESSION: V3    Past Surgical History:  Procedure Laterality Date   COLONOSCOPY WITH ESOPHAGOGASTRODUODENOSCOPY (EGD)  10-29-2011   CORONARY ANGIOPLASTY WITH STENT PLACEMENT  1996   PER PT X3 STENTS (BARE METAL)   CYSTOSCOPY W/ RETROGRADES N/A 08/31/2013   Procedure: CYSTOSCOPY WITH bilateral RETROGRADE PYELOGRAM;  Surgeon: Roque Collar, MD;  Location[facility];  Service: Urology;  Laterality: N/A;   EXTERNAL FIXATION LEG  11/09/2011   Procedure: EXTERNAL FIXATION LEG;  Surgeon: Amada Backer, MD;  Location: [facility];  Service: Orthopedics;  Laterality: Left;   EXTRACORPOREAL SHOCK WAVE LITHOTRIPSY  05-02-2009   HERNIA REPAIR     INGUINAL HERNIA REPAIR Bilateral 04/16/2015   Procedure: LAPAROSCOPIC BILATERAL INGUINAL HERNIA REPAIR;  Surgeon: Candyce Champagne, MD;  Location: [facility];  Service: General;  Laterality: Bilateral;   INSERTION OF MESH Bilateral 04/16/2015   Procedure: INSERTION OF MESH;  Surgeon: Candyce Champagne, MD;  Location: [facility];  Service: General;  Laterality: Bilateral;   ORIF CALCANEOUS FRACTURE  11/12/2011   Procedure: OPEN REDUCTION INTERNAL FIXATION (ORIF) CALCANEOUS FRACTURE;  Surgeon: Amada Backer, MD;  Location: [facility];  Service: Orthopedics;  Laterality: Right;  ORIF RIGHT CALCANEOUS FRACTURE   ORIF TIBIA FRACTURE  11/17/2011   Procedure: OPEN REDUCTION INTERNAL FIXATION (ORIF) TIBIA FRACTURE;  Surgeon: Amada Backer, MD;   Location: [facility];  Service: Orthopedics;  Laterality: Left;  REMOVAL OF EXFIX, ORIF LEFT TIBIAL PILON FRACTURE   TONSILLECTOMY AND ADENOIDECTOMY  CHILD   TRANSURETHRAL RESECTION OF BLADDER TUMOR WITH GYRUS (TURBT-GYRUS) N/A 08/31/2013   Procedure: TRANSURETHRAL RESECTION OF BLADDER TUMOR WITH GYRUS;  Surgeon: Roque Collar, MD;  Location: [facility];  Service: Urology;  Laterality: N/A;   TRANSURETHRAL RESECTION OF PROSTATE  X2   YRS AGO   BEFORE 1996   TRANSURETHRAL RESECTION OF PROSTATE  09/07/2011   Procedure: TRANSURETHRAL RESECTION OF THE PROSTATE WITH GYRUS INSTRUMENTS;  Surgeon: Trent Frizzle, MD;  Location: [facility];  Service: Urology;  Laterality: N/A;   TRANSURETHRAL RESECTION OF PROSTATE N/A 08/31/2013   Procedure: TRANSURETHRAL RESECTION OF THE PROSTATE WITH GYRUS ;  Surgeon: Roque Collar, MD;  Location: [facility];  Service: Urology;  Laterality: N/A;   UMBILICAL HERNIA REPAIR N/A 04/16/2015   Procedure: LAPAROSCOPIC UMBILICAL HERNIA;  Surgeon: Candyce Champagne, MD;  Location: [facility];  Service: General;  Laterality: N/A;    Current Medications: Current Meds  Medication Sig   cetirizine (ZYRTEC) 10 MG tablet Take 10 mg by mouth daily.   citalopram  (CELEXA ) 40 MG tablet Take 40 mg by mouth daily.   finasteride (PROSCAR) 5 MG tablet Take 5 mg by mouth daily.   gabapentin  (NEURONTIN ) 600 MG tablet Take 600 mg by mouth at bedtime.   losartan -hydrochlorothiazide  (HYZAAR) 100-25 MG tablet Take 1 tablet by mouth daily.   pantoprazole  (PROTONIX ) 40 MG tablet TAKE ONE TABLET BY MOUTH DAILY   rosuvastatin (CRESTOR) 40 MG tablet Take 40 mg by mouth daily.   silodosin (RAPAFLO) 8 MG CAPS capsule Take 8 mg by mouth at bedtime.   thyroid  (ARMOUR) 60 MG tablet Take 60 mg by mouth daily before breakfast.      Allergies:   Patient has no known allergies.   Social History   Socioeconomic History   Marital status: Single    Spouse  name: Not on file   Number of children: 3   Years of education: Not on file   Highest education level: Not on file  Occupational History   Not on file  Tobacco Use   Smoking status: Former    Current packs/day: 0.00    Average packs/day: 1 pack/day for 30.0 years (30.0 ttl pk-yrs)    Types: Cigarettes    Start date: 09/01/1964    Quit date: 09/02/1994    Years since quitting: 28.9    Passive exposure: Past   Smokeless tobacco: Never  Vaping Use   Vaping status: Never Used  Substance and Sexual Activity   Alcohol use: No   Drug use: No   Sexual activity: Not on file  Other Topics Concern   Not on file  Social History Narrative   Divorced father of 3, grandfather of 8. He lives alone. 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 Currently now on disability.   Quit smoking 29 years ago after his MI. No alcohol.   He exercises 5 times a week walking 30 minutes a day.   Social Drivers of Corporate investment banker Strain: Not on file  Food Insecurity: Not on file  Transportation Needs: Not on file  Physical Activity: Not on file  Stress: Not on file  Social Connections: Not on file     Family History: The patient's family history includes Heart disease in his father; Hypertension in his father. There is no history of Colon cancer, Esophageal cancer, Rectal cancer, Stomach cancer, Colon polyps, Crohn's disease, or Ulcerative colitis.  ROS:   Please see the history of present illness.    All other systems reviewed and are negative.  EKGs/Labs/Other Studies Reviewed:    The following studies were reviewed today:  EKG Interpretation Date/Time:  Thursday July 29 2023 14:45:21 EDT Ventricular Rate:  113 PR Interval:  148 QRS Duration:  90 QT Interval:  354 QTC Calculation: 485 R Axis:   36  Text Interpretation: Sinus tachycardia with frequent Premature ventricular complexes Nonspecific ST and T wave abnormality Abnormal ECG When compared with  ECG of 24-May-2022 21:10, PREVIOUS ECG IS PRESENT Confirmed by Hillis Lu [phone]) on 07/29/2023 2:50:18 PM     Recent Labs: No results found for requested labs within last 365 days.  Recent Lipid Panel    Component Value Date/Time   CHOL 146 05/28/2021 0849   TRIG 91 05/28/2021 0849   HDL 36 (L) 05/28/2021 0849   CHOLHDL 4.1 05/28/2021 0849   LDLCALC 93 05/28/2021 0849    Physical Exam:    VS:  BP 130/84   Pulse (!) 113   Ht 5' 10 (1.778 m)   Wt 197 lb (89.4 kg)   SpO2 95%   BMI 28.27 kg/m     Wt Readings from Last 3 Encounters:  07/29/23 197 lb (89.4 kg)  08/05/22 188 lb 3.2 oz (85.4 kg)  05/24/22 184 lb (83.5 kg)     GEN: Patient is in no acute distress HEENT: Normal NECK: No JVD; No carotid bruits LYMPHATICS: No lymphadenopathy CARDIAC: S1 S2 regular, 2/6 systolic murmur at the apex. RESPIRATORY:  Clear to auscultation without rales, wheezing or rhonchi  ABDOMEN: Soft, non-tender, non-distended MUSCULOSKELETAL:  No edema; No deformity  SKIN: Warm and dry NEUROLOGIC:  Alert and oriented x 3 PSYCHIATRIC:  Normal affect    Signed, Nelia Balzarine, MD  07/29/2023 3:13 PM    [facility]

## 2023-07-29 NOTE — Patient Instructions (Signed)
 Please keep a BP log for 2 weeks and send by MyChart or mail.                         [address] [place], [place] [address]  Blood Pressure Record Sheet To take your blood pressure, you will need a blood pressure machine. You can buy a blood pressure machine (blood pressure monitor) at your clinic, drug store, or online. When choosing one, consider: An automatic monitor that has an arm cuff. A cuff that wraps snugly around your upper arm. You should be able to fit only one finger between your arm and the cuff. A device that stores blood pressure reading results. Do not choose a monitor that measures your blood pressure from your wrist or finger. Follow your health care provider's instructions for how to take your blood pressure. To use this form: Get one reading in the morning (a.m.) 1-2 hours after you take any medicines. Get one reading in the evening (p.m.) before supper.   Blood pressure log Date: _______________________  a.m. _____________________(1st reading) HR___________            p.m. _____________________(2nd reading) HR__________  Date: _______________________  a.m. _____________________(1st reading) HR___________            p.m. _____________________(2nd reading) HR__________  Date: _______________________  a.m. _____________________(1st reading) HR___________            p.m. _____________________(2nd reading) HR__________  Date: _______________________  a.m. _____________________(1st reading) HR___________            p.m. _____________________(2nd reading) HR__________  Date: _______________________  a.m. _____________________(1st reading) HR___________            p.m. _____________________(2nd reading) HR__________  Date: _______________________  a.m. _____________________(1st reading) HR___________            p.m. _____________________(2nd reading) HR__________  Date: _______________________  a.m. _____________________(1st reading)  HR___________            p.m. _____________________(2nd reading) HR__________   This information is not intended to replace advice given to you by your health care provider. Make sure you discuss any questions you have with your health care provider. Document Revised: 05/24/2019 Document Reviewed: 05/24/2019 Elsevier Patient Education  2021 Elsevier Inc.   Medication Instructions:  Your physician has recommended you make the following change in your medication:   Start taking 81 mg coated aspirin  Use nitroglycerin  1 tablet placed under the tongue at the first sign of chest pain or an angina attack. 1 tablet may be used every 5 minutes as needed, for up to 15 minutes. Do not take more than 3 tablets in 15 minutes. If pain persist call 911 or go to the nearest ED.  Decrease your Losartan -hydrochlorothiazide  in half to 50-12.5 mg daily  Start Metoprolol succinate (Toprol XL) 50 mg daily  *If you need a refill on your cardiac medications before your next appointment, please call your pharmacy*   Lab Work: Your physician recommends that you return for lab work in: the day of your stress test for CMP, CBC, TSH and fasting lipids. You need to have labs done when you are fasting.  You can come Monday through Friday 8:30 am to 12:00 pm and 1:15 to 4:30. You do not need to make an appointment as the order has already been placed.   If you have labs (blood work) drawn today and your tests are completely normal, you will receive your results only by: MyChart Message (if  you have MyChart) OR A paper copy in the mail If you have any lab test that is abnormal or we need to change your treatment, we will call you to review the results.   Testing/Procedures:   [facility]ovascular Imaging at [facility] [address] [place], [place] [address] Phone: [phone]   Please arrive 15 minutes prior to your appointment time for registration and insurance purposes.  The test will take  approximately 3 to 4 hours to complete; you may bring reading material.  If someone comes with you to your appointment, they will need to remain in the main lobby due to limited space in the testing area.   How to prepare for your Myocardial Perfusion Test: Do not eat or drink after midnight prior to your test, except you may have water. Do not consume products containing caffeine (regular or decaffeinated) 12 hours prior to your test. (ex: coffee, chocolate, sodas, tea). Do bring a list of your current medications with you.  If not listed below, you may take your medications as normal. Do wear comfortable clothes (no dresses or overalls) and walking shoes, tennis shoes preferred (No heels or open toe shoes are allowed). Do NOT wear cologne, perfume, aftershave, or lotions (deodorant is allowed). If these instructions are not followed, your test will have to be rescheduled.  If you cannot keep your appointment, please provide 24 hours notification to the Nuclear Lab, to avoid a possible $50 charge to your account.     Your physician has requested that you have an echocardiogram. Echocardiography is a painless test that uses sound waves to create images of your heart. It provides your doctor with information about the size and shape of your heart and how well your heart's chambers and valves are working. This procedure takes approximately one hour. There are no restrictions for this procedure. Please do NOT wear cologne, perfume, aftershave, or lotions (deodorant is allowed). Please arrive 15 minutes prior to your appointment time.  Please note: We ask at that you not bring children with you during ultrasound (echo/ vascular) testing. Due to room size and safety concerns, children are not allowed in the ultrasound rooms during exams. Our front office staff cannot provide observation of children in our lobby area while testing is being conducted. An adult accompanying a patient to their appointment  will only be allowed in the ultrasound room at the discretion of the ultrasound technician under special circumstances. We apologize for any inconvenience.    Follow-Up: At [facility], you and your health needs are our priority.  As part of our continuing mission to provide you with exceptional heart care, we have created designated Provider Care Teams.  These Care Teams include your primary Cardiologist (physician) and Advanced Practice Providers (APPs -  Physician Assistants and Nurse Practitioners) who all work together to provide you with the care you need, when you need it.  We recommend signing up for the patient portal called MyChart.  Sign up information is provided on this After Visit Summary.  MyChart is used to connect with patients for Virtual Visits (Telemedicine).  Patients are able to view lab/test results, encounter notes, upcoming appointments, etc.  Non-urgent messages can be sent to your provider as well.   To learn more about what you can do with MyChart, go to [email or web address].    Your next appointment:   9 month(s)  Provider:   Hillis Lu, MD   Other Instructions  Cardiac Nuclear Scan A cardiac nuclear scan  is a test that is done to check the flow of blood to your heart. It is done when you are resting and when you are exercising. The test looks for problems such as: Not enough blood reaching a portion of the heart. The heart muscle not working as it should. You may need this test if you have: Heart disease. Lab results that are not normal. Had heart surgery or a balloon procedure to open up blocked arteries (angioplasty) or a small mesh tube (stent). Chest pain. Shortness of breath. Had a heart attack. In this test, a special dye (tracer) is put into your bloodstream. The tracer will travel to your heart. A camera will then take pictures of your heart to see how the tracer moves through your heart. This test is usually done at a hospital and  takes 2-4 hours. Tell a doctor about: Any allergies you have. All medicines you are taking, including vitamins, herbs, eye drops, creams, and over-the-counter medicines. Any bleeding problems you have. Any surgeries you have had. Any medical conditions you have. Whether you are pregnant or may be pregnant. Any history of asthma or long-term (chronic) lung disease. Any history of heart rhythm disorders or heart valve conditions. What are the risks? Your doctor will talk with you about risks. These may include: Serious chest pain and heart attack. This is only a risk if the stress portion of the test is done. Fast or uneven heartbeats (palpitations). A feeling of warmth in your chest. This feeling usually does not last long. Allergic reaction to the tracer. Shortness of breath or trouble breathing. What happens before the test? Ask your doctor about changing or stopping your normal medicines. Follow instructions from your doctor about what you cannot eat or drink. Remove your jewelry on the day of the test. Ask your doctor if you need to avoid nicotine or caffeine. What happens during the test? An IV tube will be inserted into one of your veins. Your doctor will give you a small amount of tracer through the IV tube. You will wait for 20-40 minutes while the tracer moves through your bloodstream. Your heart will be monitored with an electrocardiogram (ECG). You will lie down on an exam table. Pictures of your heart will be taken for about 15-20 minutes. You may also have a stress test. For this test, one of these things may be done: You will be asked to exercise on a treadmill or a stationary bike. You will be given medicines that will make your heart work harder. This is done if you are unable to exercise. When blood flow to your heart has peaked, a tracer will again be given through the IV tube. After 20-40 minutes, you will get back on the exam table. More pictures will be taken of  your heart. Depending on the tracer that is used, more pictures may need to be taken 3-4 hours later. Your IV tube will be removed when the test is over. The test may vary among doctors and hospitals. What happens after the test? Ask your doctor: Whether you can return to your normal schedule, including diet, activities, travel, and medicines. Whether you should drink more fluids. This will help to remove the tracer from your body. Ask your doctor, or the department that is doing the test: When will my results be ready? How will I get my results? What are my treatment options? What other tests do I need? What are my next steps? This information is not intended to  replace advice given to you by your health care provider. Make sure you discuss any questions you have with your health care provider. Document Revised: 07/01/2021 Document Reviewed: 07/01/2021 Elsevier Patient Education  2023 Elsevier Inc.  Echocardiogram An echocardiogram is a test that uses sound waves (ultrasound) to produce images of the heart. Images from an echocardiogram can provide important information about: Heart size and shape. The size and thickness and movement of your heart's walls. Heart muscle function and strength. Heart valve function or if you have stenosis. Stenosis is when the heart valves are too narrow. If blood is flowing backward through the heart valves (regurgitation). A tumor or infectious growth around the heart valves. Areas of heart muscle that are not working well because of poor blood flow or injury from a heart attack. Aneurysm detection. An aneurysm is a weak or damaged part of an artery wall. The wall bulges out from the normal force of blood pumping through the body. Tell a health care provider about: Any allergies you have. All medicines you are taking, including vitamins, herbs, eye drops, creams, and over-the-counter medicines. Any blood disorders you have. Any surgeries you have  had. Any medical conditions you have. Whether you are pregnant or may be pregnant. What are the risks? Generally, this is a safe test. However, problems may occur, including an allergic reaction to dye (contrast) that may be used during the test. What happens before the test? No specific preparation is needed. You may eat and drink normally. What happens during the test?  You will take off your clothes from the waist up and put on a hospital gown. Electrodes or electrocardiogram (ECG)patches may be placed on your chest. The electrodes or patches are then connected to a device that monitors your heart rate and rhythm. You will lie down on a table for an ultrasound exam. A gel will be applied to your chest to help sound waves pass through your skin. A handheld device, called a transducer, will be pressed against your chest and moved over your heart. The transducer produces sound waves that travel to your heart and bounce back (or echo back) to the transducer. These sound waves will be captured in real-time and changed into images of your heart that can be viewed on a video monitor. The images will be recorded on a computer and reviewed by your health care provider. You may be asked to change positions or hold your breath for a short time. This makes it easier to get different views or better views of your heart. In some cases, you may receive contrast through an IV in one of your veins. This can improve the quality of the pictures from your heart. The procedure may vary among health care providers and hospitals. What can I expect after the test? You may return to your normal, everyday life, including diet, activities, and medicines, unless your health care provider tells you not to do that. Follow these instructions at home: It is up to you to get the results of your test. Ask your health care provider, or the department that is doing the test, when your results will be ready. Keep all follow-up  visits. This is important. Summary An echocardiogram is a test that uses sound waves (ultrasound) to produce images of the heart. Images from an echocardiogram can provide important information about the size and shape of your heart, heart muscle function, heart valve function, and other possible heart problems. You do not need to do anything to  prepare before this test. You may eat and drink normally. After the echocardiogram is completed, you may return to your normal, everyday life, unless your health care provider tells you not to do that. This information is not intended to replace advice given to you by your health care provider. Make sure you discuss any questions you have with your health care provider. Document Revised: 10/16/2020 Document Reviewed: 09/26/2019 Elsevier Patient Education  2023 Elsevier Inc.    Important Information About Sugar

## 2023-08-11 ENCOUNTER — Telehealth: Payer: Self-pay | Admitting: *Deleted

## 2023-08-11 NOTE — Telephone Encounter (Signed)
 Left a detailed message on voicemail regarding a STRESS TEST on 08/18/23 at 7:45.

## 2023-08-18 ENCOUNTER — Ambulatory Visit (INDEPENDENT_AMBULATORY_CARE_PROVIDER_SITE_OTHER)

## 2023-08-18 ENCOUNTER — Ambulatory Visit: Attending: Cardiology

## 2023-08-18 DIAGNOSIS — R079 Chest pain, unspecified: Secondary | ICD-10-CM

## 2023-08-18 DIAGNOSIS — R011 Cardiac murmur, unspecified: Secondary | ICD-10-CM

## 2023-08-18 DIAGNOSIS — I2510 Atherosclerotic heart disease of native coronary artery without angina pectoris: Secondary | ICD-10-CM | POA: Diagnosis not present

## 2023-08-18 LAB — MYOCARDIAL PERFUSION IMAGING
LV dias vol: 57 mL (ref 62–150)
LV sys vol: 30 mL (ref 4.2–5.8)
Nuc Stress EF: 47 %
Peak HR: 107 {beats}/min
Rest HR: 87 {beats}/min
Rest Nuclear Isotope Dose: 10.6 mCi
SDS: 9
SRS: 10
SSS: 19
Stress Nuclear Isotope Dose: 28.5 mCi
TID: 1.02

## 2023-08-18 MED ORDER — TECHNETIUM TC 99M TETROFOSMIN IV KIT
10.6000 | PACK | Freq: Once | INTRAVENOUS | Status: AC | PRN
  Administered 2023-08-18: 10.6 via INTRAVENOUS

## 2023-08-18 MED ORDER — TECHNETIUM TC 99M TETROFOSMIN IV KIT
28.5000 | PACK | Freq: Once | INTRAVENOUS | Status: AC | PRN
  Administered 2023-08-18: 28.5 via INTRAVENOUS

## 2023-08-18 MED ORDER — REGADENOSON 0.4 MG/5ML IV SOLN
0.4000 mg | Freq: Once | INTRAVENOUS | Status: AC
Start: 2023-08-18 — End: 2023-08-18
  Administered 2023-08-18: 0.4 mg via INTRAVENOUS

## 2023-08-19 ENCOUNTER — Ambulatory Visit: Payer: Self-pay | Admitting: Cardiology

## 2023-08-19 LAB — ECHOCARDIOGRAM COMPLETE
AR max vel: 1.83 cm2
AV Area VTI: 2.37 cm2
AV Area mean vel: 2.03 cm2
AV Mean grad: 3 mmHg
AV Peak grad: 5.5 mmHg
Ao pk vel: 1.17 m/s
Area-P 1/2: 2.9 cm2
Height: 70 in
MV VTI: 1.61 cm2
S' Lateral: 2.7 cm
Weight: 3152 [oz_av]

## 2023-09-02 NOTE — Telephone Encounter (Signed)
 Patient called. Left message for patient to call back.   Aspirin  nitroglycerin  and please bring him in to be evaluated to discuss possible coronary angiography.   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 Will monitor this and repeat echo in 1 year.

## 2023-09-02 NOTE — Telephone Encounter (Signed)
-----   Message from Jennifer SAUNDERS Revankar sent at 08/19/2023  2:23 PM EDT ----- The results of the study is unremarkable.  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  Will monitor this and repeat echo in 1 year.  Medical management please inform patient. I will discuss  in detail at next appointment. Cc  primary care/referring physician Jennifer SAUNDERS Crape, MD 08/19/2023 2:23 PM  ----- Message ----- From: Interface, [employer] Sent: 08/19/2023   2:18 PM EDT To: Jennifer SAUNDERS Crape, MD

## 2023-09-09 ENCOUNTER — Encounter: Payer: Self-pay | Admitting: Cardiology

## 2023-09-09 ENCOUNTER — Ambulatory Visit: Attending: Cardiology | Admitting: Cardiology

## 2023-09-09 VITALS — BP 130/80 | HR 106 | Ht 70.0 in | Wt 191.6 lb

## 2023-09-09 DIAGNOSIS — I2510 Atherosclerotic heart disease of native coronary artery without angina pectoris: Secondary | ICD-10-CM | POA: Diagnosis not present

## 2023-09-09 DIAGNOSIS — E782 Mixed hyperlipidemia: Secondary | ICD-10-CM | POA: Diagnosis not present

## 2023-09-09 DIAGNOSIS — I10 Essential (primary) hypertension: Secondary | ICD-10-CM | POA: Diagnosis not present

## 2023-09-09 DIAGNOSIS — G4730 Sleep apnea, unspecified: Secondary | ICD-10-CM

## 2023-09-09 MED ORDER — METOPROLOL SUCCINATE ER 100 MG PO TB24: 100.0000 mg | ORAL_TABLET | Freq: Every day | ORAL | 3 refills | Status: AC

## 2023-09-09 NOTE — Patient Instructions (Signed)
 Medication Instructions:  Your physician has recommended you make the following change in your medication:   STOP: Losartan / Hydrochlorothiazide  (Hyzaar) START: Metoprolol  succinate 100 mg daily  *If you need a refill on your cardiac medications before your next appointment, please call your pharmacy*  Lab Work: None If you have labs (blood work) drawn today and your tests are completely normal, you will receive your results only by: MyChart Message (if you have MyChart) OR A paper copy in the mail If you have any lab test that is abnormal or we need to change your treatment, we will call you to review the results.  Testing/Procedures: None  Follow-Up: At [facility], you and your health needs are our priority.  As part of our continuing mission to provide you with exceptional heart care, our providers are all part of one team.  This team includes your primary Cardiologist (physician) and Advanced Practice Providers or APPs (Physician Assistants and Nurse Practitioners) who all work together to provide you with the care you need, when you need it.  Your next appointment:   9 month(s)  Provider:   Jennifer Crape, MD    We recommend signing up for the patient portal called MyChart.  Sign up information is provided on this After Visit Summary.  MyChart is used to connect with patients for Virtual Visits (Telemedicine).  Patients are able to view lab/test results, encounter notes, upcoming appointments, etc.  Non-urgent messages can be sent to your provider as well.   To learn more about what you can do with MyChart, go to [email or web address].   Other Instructions Please keep a BP log for 2 weeks and send by MyChart or mail.                          Name and DOB__________________________ Dr. Crape [address] [place], [place] [address]  Blood Pressure Record Sheet To take your blood pressure, you will need a blood pressure machine. You can buy a blood pressure  machine (blood pressure monitor) at your clinic, drug store, or online. When choosing one, consider: An automatic monitor that has an arm cuff. A cuff that wraps snugly around your upper arm. You should be able to fit only one finger between your arm and the cuff. A device that stores blood pressure reading results. Do not choose a monitor that measures your blood pressure from your wrist or finger. Follow your health care provider's instructions for how to take your blood pressure. To use this form: Get one reading in the morning (a.m.) 1-2 hours after you take any medicines. Get one reading in the evening (p.m.) before supper.   Blood pressure log Date: _______________________  a.m. _____________________(1st reading) HR___________            p.m. _____________________(2nd reading) HR__________  Date: _______________________  a.m. _____________________(1st reading) HR___________            p.m. _____________________(2nd reading) HR__________  Date: _______________________  a.m. _____________________(1st reading) HR___________            p.m. _____________________(2nd reading) HR__________  Date: _______________________  a.m. _____________________(1st reading) HR___________            p.m. _____________________(2nd reading) HR__________  Date: _______________________  a.m. _____________________(1st reading) HR___________            p.m. _____________________(2nd reading) HR__________  Date: _______________________  a.m. _____________________(1st reading) HR___________            p.m. _____________________(2nd  reading) HR__________  Date: _______________________  a.m. _____________________(1st reading) HR___________            p.m. _____________________(2nd reading) HR__________   This information is not intended to replace advice given to you by your health care provider. Make sure you discuss any questions you have with your health care provider. Document  Revised: 05/24/2019 Document Reviewed: 05/24/2019 Elsevier Patient Education  2021 [employer].

## 2023-09-09 NOTE — Progress Notes (Signed)
 Cardiology Office Note:    Date:  09/09/2023   ID:  Larry Randall, DOB 1950-12-10, MRN [id number]  PCP:  Silvano Angeline FALCON, NP  Cardiologist:  Jennifer JONELLE Crape, MD   Referring MD: Silvano Angeline FALCON, NP    ASSESSMENT:    1. Coronary artery disease involving native coronary artery of native heart without angina pectoris   2. Essential (primary) hypertension   3. Sleep apnea, unspecified type   4. Mixed hyperlipidemia    PLAN:    In order of problems listed above:  Coronary artery disease: Secondary prevention discussed with the patient.  Importance of compliance with diet modifications stressed and vocalized understanding.  He was advised to ambulate to the best of his ability.  He has orthopedic issues. Essential hypertension: Blood pressure is stable and diet was emphasized. Mixed dyslipidemia: On lipid-lowering medications followed by primary care.  Goal LDL needs to be less than 60. Abdominal aortic aneurysm: He tells me that is followed by primary care and is scanning on the regular basis.  If he has any questions he will let me know.  I do not have those reports. Patient will be seen in follow-up appointment in 9 months or earlier if the patient has any concerns.    Medication Adjustments/Labs and Tests Ordered: Current medicines are reviewed at length with the patient today.  Concerns regarding medicines are outlined above.  Orders Placed This Encounter  Procedures   EKG 12-Lead   No orders of the defined types were placed in this encounter.    No chief complaint on file.    History of Present Illness:    Larry Randall is a 73 y.o. male.  Patient has a past medical history of essential hypertension, mixed dyslipidemia, coronary artery disease and abdominal aortic aneurysm.  He denies any problems at this time and no chest pain orthopnea or PND.  At the time of my evaluation, the patient is alert awake oriented and in no distress.  Past Medical History:   Diagnosis Date   AAA (abdominal aortic aneurysm) (HCC)    Allergic rhinitis    Allergy    Anxiety    Arthritis    LEFT LEG AND ANKLE   At risk for sleep apnea    STOP-BANG=  6   SENT TO PCP 08-28-2013   Atherosclerosis of aorta (HCC)    Atypical rash OF MOUTH -- PER PT UNKNOWN ETIOLOGY   RX MOUTHWASH BID-  PT STATES POSSIBLE FROM GERD   Bladder mass    BLADDER NECK AREA   Borderline diabetes    Cardiac murmur 07/29/2023   Chest pain    Chest pain of uncertain etiology 05/28/2021   Chronic pain due to trauma 03/29/2012   Coronary artery disease involving native coronary artery of native heart without angina pectoris 07/25/2015   Depression    Dyslipidemia, goal LDL below 70 07/25/2015   Essential (primary) hypertension    Essential hypertension 07/25/2015   GERD (gastroesophageal reflux disease)    H/O hiatal hernia    Hemorrhagic cystitis 08/14/2013   History of CHF (congestive heart failure)    History of colon polyps    2013   History of gastric ulcer    History of kidney stones    History of urinary retention    Hx of myocardial infarction 07/25/2015   Hyperlipemia    Hypertrophy of prostate with urinary obstruction and other lower urinary tract symptoms (LUTS) 08/31/2013   Hypothyroidism    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 IMPRESSION: V3    Past Surgical History:  Procedure Laterality Date   COLONOSCOPY WITH ESOPHAGOGASTRODUODENOSCOPY (EGD)  10-29-2011   CORONARY ANGIOPLASTY WITH STENT PLACEMENT   1996   PER PT X3 STENTS (BARE METAL)   CYSTOSCOPY W/ RETROGRADES N/A 08/31/2013   Procedure: CYSTOSCOPY WITH bilateral RETROGRADE PYELOGRAM;  Surgeon: Garnette CHRISTELLA Shack, MD;  Location: [facility];  Service: Urology;  Laterality: N/A;   EXTERNAL FIXATION LEG  11/09/2011   Procedure: EXTERNAL FIXATION LEG;  Surgeon: Norleen Armor, MD;  Location: [facility];  Service: Orthopedics;  Laterality: Left;   EXTRACORPOREAL SHOCK WAVE LITHOTRIPSY  05-02-2009   HERNIA REPAIR     INGUINAL HERNIA REPAIR Bilateral 04/16/2015   Procedure: LAPAROSCOPIC BILATERAL INGUINAL HERNIA REPAIR;  Surgeon: Elspeth Schultze, MD;  Location: [facility];  Service: General;  Laterality: Bilateral;   INSERTION OF MESH Bilateral 04/16/2015   Procedure: INSERTION OF MESH;  Surgeon: Elspeth Schultze, MD;  Location: [facility];  Service: General;  Laterality: Bilateral;   ORIF CALCANEOUS FRACTURE  11/12/2011   Procedure: OPEN REDUCTION INTERNAL FIXATION (ORIF) CALCANEOUS FRACTURE;  Surgeon: Norleen Armor, MD;  Location: [facility];  Service: Orthopedics;  Laterality: Right;  ORIF RIGHT CALCANEOUS FRACTURE   ORIF TIBIA FRACTURE  11/17/2011   Procedure: OPEN REDUCTION INTERNAL FIXATION (ORIF) TIBIA FRACTURE;  Surgeon: Norleen Armor, MD;  Location: [facility];  Service: Orthopedics;  Laterality: Left;  REMOVAL OF EXFIX, ORIF LEFT TIBIAL PILON FRACTURE   TONSILLECTOMY AND ADENOIDECTOMY  CHILD   TRANSURETHRAL RESECTION OF BLADDER TUMOR WITH GYRUS (TURBT-GYRUS) N/A 08/31/2013   Procedure: TRANSURETHRAL RESECTION OF BLADDER TUMOR WITH GYRUS;  Surgeon: Garnette CHRISTELLA Shack, MD;  Location: [facility];  Service: Urology;  Laterality: N/A;   TRANSURETHRAL RESECTION OF PROSTATE  X2   YRS AGO   BEFORE 1996   TRANSURETHRAL RESECTION OF PROSTATE  09/07/2011   Procedure: TRANSURETHRAL RESECTION OF THE PROSTATE WITH GYRUS INSTRUMENTS;  Surgeon: Garnette Shack, MD;  Location[facility];  Service: Urology;  Laterality: N/A;   TRANSURETHRAL  RESECTION OF PROSTATE N/A 08/31/2013   Procedure: TRANSURETHRAL RESECTION OF THE PROSTATE WITH GYRUS ;  Surgeon: Garnette CHRISTELLA Shack, MD;  [facility];  Service: Urology;  Laterality: N/A;   UMBILICAL HERNIA REPAIR N/A 04/16/2015   Procedure: LAPAROSCOPIC UMBILICAL HERNIA;  Surgeon: Elspeth Schultze, MD;  Location: [facility];  Service: General;  Laterality: N/A;    Current Medications: Current Meds  Medication Sig   aspirin  EC 81 MG tablet Take 1 tablet (81 mg total) by mouth daily. Swallow whole.   cetirizine (ZYRTEC) 10 MG tablet Take 10 mg by mouth daily.   citalopram  (CELEXA ) 40 MG tablet Take 40 mg by mouth daily.   finasteride (PROSCAR) 5 MG tablet Take 5 mg by mouth daily.   gabapentin  (NEURONTIN ) 600 MG tablet Take 600 mg by mouth at bedtime.   losartan -hydrochlorothiazide  (HYZAAR) 50-12.5 MG tablet Take 1 tablet by mouth  daily.   metoprolol  succinate (TOPROL -XL) 50 MG 24 hr tablet Take 1 tablet (50 mg total) by mouth daily. Take with or immediately following a meal.   pantoprazole  (PROTONIX ) 40 MG tablet TAKE ONE TABLET BY MOUTH DAILY   rosuvastatin (CRESTOR) 40 MG tablet Take 40 mg by mouth daily.   silodosin (RAPAFLO) 8 MG CAPS capsule Take 8 mg by mouth at bedtime.   thyroid  (ARMOUR) 60 MG tablet Take 60 mg by mouth daily before breakfast.      Allergies:   Patient has no known allergies.   Social History   Socioeconomic History   Marital status: Single    Spouse name: Not on file   Number of children: 3   Years of education: Not on file   Highest education level: Not on file  Occupational History   Not on file  Tobacco Use   Smoking status: Former    Current packs/day: 0.00    Average packs/day: 1 pack/day for 30.0 years (30.0 ttl pk-yrs)    Types: Cigarettes    Start date: 09/01/1964    Quit date: 09/02/1994    Years since quitting: 29.0    Passive exposure: Past   Smokeless tobacco: Never  Vaping Use   Vaping status: Never Used  Substance and  Sexual Activity   Alcohol use: No   Drug use: No   Sexual activity: Not on file  Other Topics Concern   Not on file  Social History Narrative   Divorced father of 3, grandfather of 8. He lives alone. 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 Currently now on disability.   Quit smoking 29 years ago after his MI. No alcohol.   He exercises 5 times a week walking 30 minutes a day.   Social Drivers of Corporate investment banker Strain: Not on file  Food Insecurity: Not on file  Transportation Needs: Not on file  Physical Activity: Not on file  Stress: Not on file  Social Connections: Not on file     Family History: The patient's family history includes Heart disease in his father; Hypertension in his father. There is no history of Colon cancer, Esophageal cancer, Rectal cancer, Stomach cancer, Colon polyps, Crohn's disease, or Ulcerative colitis.  ROS:   Please see the history of present illness.    All other systems reviewed and are negative.  EKGs/Labs/Other Studies Reviewed:    The following studies were reviewed today: .SABRAEKG Interpretation Date/Time:  Thursday September 09 2023 12:59:15 EDT Ventricular Rate:  106 PR Interval:  148 QRS Duration:  94 QT Interval:  360 QTC Calculation: 478 R Axis:   8  Text Interpretation: Sinus tachycardia with occasional Premature ventricular complexes Minimal voltage criteria for LVH, may be normal variant Inferior infarct , age undetermined Abnormal ECG When compared with ECG of 29-Jul-2023 14:45, Inferior infarct is now Present Nonspecific T wave abnormality, worse in Inferior leads Confirmed by Edwyna Backers [phone]) on 09/09/2023 1:02:31 PM     Recent Labs: No results found for requested labs within last 365 days.  Recent Lipid Panel    Component Value Date/Time   CHOL 146 05/28/2021 0849   TRIG 91 05/28/2021 0849   HDL 36 (L) 05/28/2021 0849   CHOLHDL 4.1 05/28/2021 0849   LDLCALC 93 05/28/2021 0849     Physical Exam:    VS:  BP 130/80   Pulse (!) 106   Ht 5' 10 (1.778 m)   Wt 191 lb  9.6 oz (86.9 kg)   SpO2 95%   BMI 27.49 kg/m     Wt Readings from Last 3 Encounters:  09/09/23 191 lb 9.6 oz (86.9 kg)  08/18/23 197 lb (89.4 kg)  07/29/23 197 lb (89.4 kg)     GEN: Patient is in no acute distress HEENT: Normal NECK: No JVD; No carotid bruits LYMPHATICS: No lymphadenopathy CARDIAC: Hear sounds regular, 2/6 systolic murmur at the apex. RESPIRATORY:  Clear to auscultation without rales, wheezing or rhonchi  ABDOMEN: Soft, non-tender, non-distended MUSCULOSKELETAL:  No edema; No deformity  SKIN: Warm and dry NEUROLOGIC:  Alert and oriented x 3 PSYCHIATRIC:  Normal affect   Signed, Jennifer JONELLE Crape, MD  09/09/2023 1:18 PM    [facility]
